# Patient Record
Sex: Male | Born: 1955 | Race: White | Hispanic: No | Marital: Married | Smoking: Never smoker
Health system: Southern US, Community
[De-identification: ages and names within clinical notes are randomized; demographics above are authoritative.]

## PROBLEM LIST (undated history)

## (undated) DIAGNOSIS — C719 Malignant neoplasm of brain, unspecified: Secondary | ICD-10-CM

## (undated) DIAGNOSIS — C801 Malignant (primary) neoplasm, unspecified: Secondary | ICD-10-CM

## (undated) DIAGNOSIS — C4490 Unspecified malignant neoplasm of skin, unspecified: Secondary | ICD-10-CM

## (undated) DIAGNOSIS — F419 Anxiety disorder, unspecified: Secondary | ICD-10-CM

## (undated) DIAGNOSIS — M1990 Unspecified osteoarthritis, unspecified site: Secondary | ICD-10-CM

## (undated) DIAGNOSIS — T4145XA Adverse effect of unspecified anesthetic, initial encounter: Secondary | ICD-10-CM

## (undated) DIAGNOSIS — K219 Gastro-esophageal reflux disease without esophagitis: Secondary | ICD-10-CM

## (undated) DIAGNOSIS — R0600 Dyspnea, unspecified: Secondary | ICD-10-CM

## (undated) DIAGNOSIS — T8859XA Other complications of anesthesia, initial encounter: Secondary | ICD-10-CM

## (undated) HISTORY — PX: KNEE ARTHROPLASTY: SHX992

## (undated) HISTORY — PX: JOINT REPLACEMENT: SHX530

## (undated) HISTORY — PX: KNEE ARTHROSCOPY: SUR90

## (undated) HISTORY — PX: SHOULDER ARTHROSCOPY: SHX128

## (undated) HISTORY — PX: OTHER SURGICAL HISTORY: SHX169

## (undated) HISTORY — PX: ANKLE SURGERY: SHX546

---

## 2005-12-17 ENCOUNTER — Inpatient Hospital Stay (HOSPITAL_COMMUNITY): Admission: RE | Admit: 2005-12-17 | Discharge: 2005-12-20 | Payer: Self-pay | Admitting: Specialist

## 2010-06-26 ENCOUNTER — Ambulatory Visit (HOSPITAL_COMMUNITY): Admission: RE | Admit: 2010-06-26 | Discharge: 2010-06-26 | Payer: Self-pay | Admitting: Specialist

## 2010-08-14 ENCOUNTER — Inpatient Hospital Stay (HOSPITAL_COMMUNITY): Admission: RE | Admit: 2010-08-14 | Discharge: 2010-08-16 | Payer: Self-pay | Admitting: Orthopedic Surgery

## 2010-10-14 ENCOUNTER — Encounter: Payer: Self-pay | Admitting: Specialist

## 2010-12-04 LAB — CBC
HCT: 27.7 % — ABNORMAL LOW (ref 39.0–52.0)
HCT: 32.1 % — ABNORMAL LOW (ref 39.0–52.0)
Hemoglobin: 11.2 g/dL — ABNORMAL LOW (ref 13.0–17.0)
Hemoglobin: 9.8 g/dL — ABNORMAL LOW (ref 13.0–17.0)
MCH: 32.1 pg (ref 26.0–34.0)
MCHC: 35.2 g/dL (ref 30.0–36.0)
MCV: 91.7 fL (ref 78.0–100.0)
Platelets: 318 10*3/uL (ref 150–400)
RBC: 3.54 MIL/uL — ABNORMAL LOW (ref 4.22–5.81)
RDW: 13.1 % (ref 11.5–15.5)
RDW: 13.3 % (ref 11.5–15.5)
WBC: 5.4 10*3/uL (ref 4.0–10.5)
WBC: 6.1 10*3/uL (ref 4.0–10.5)
WBC: 7.7 10*3/uL (ref 4.0–10.5)

## 2010-12-04 LAB — BASIC METABOLIC PANEL
BUN: 12 mg/dL (ref 6–23)
BUN: 17 mg/dL (ref 6–23)
CO2: 26 mEq/L (ref 19–32)
Calcium: 9.7 mg/dL (ref 8.4–10.5)
Chloride: 102 mEq/L (ref 96–112)
Chloride: 97 mEq/L (ref 96–112)
Creatinine, Ser: 1.03 mg/dL (ref 0.4–1.5)
GFR calc Af Amer: >60 mL/min (ref >60)
GFR calc Af Amer: >60 mL/min (ref >60)
GFR calc non Af Amer: >60 mL/min (ref >60)
GFR calc non Af Amer: >60 mL/min (ref >60)
Glucose, Bld: 113 mg/dL — ABNORMAL HIGH (ref 70–99)
Potassium: 3.7 mEq/L (ref 3.5–5.1)
Potassium: 4 mEq/L (ref 3.5–5.1)
Sodium: 137 mEq/L (ref 135–145)
Sodium: 140 mEq/L (ref 135–145)

## 2010-12-04 LAB — BODY FLUID CULTURE: Culture: NO GROWTH

## 2010-12-04 LAB — PROTIME-INR
INR: 0.96 (ref 0.00–1.49)
Prothrombin Time: 13 seconds (ref 11.6–15.2)

## 2010-12-04 LAB — ANAEROBIC CULTURE

## 2010-12-04 LAB — DIFFERENTIAL
Basophils Absolute: 0 10*3/uL (ref 0.0–0.1)
Eosinophils Absolute: 0.1 10*3/uL (ref 0.0–0.7)
Eosinophils Relative: 1 % (ref 0–5)
Neutrophils Relative %: 68 % (ref 43–77)

## 2010-12-04 LAB — URINALYSIS, ROUTINE W REFLEX MICROSCOPIC
Glucose, UA: NEGATIVE mg/dL
Ketones, ur: NEGATIVE mg/dL
Protein, ur: NEGATIVE mg/dL
Urobilinogen, UA: 0.2 mg/dL (ref 0.0–1.0)

## 2010-12-04 LAB — SURGICAL PCR SCREEN: Staphylococcus aureus: POSITIVE — AB

## 2010-12-04 LAB — GRAM STAIN

## 2011-02-08 NOTE — Discharge Summary (Signed)
Justin Hurst, DAUGHTRIDGE              ACCOUNT NO.:  [id number]   MEDICAL RECORD NO.:  [id number]          PATIENT TYPE:  INP   LOCATION:  1521                         FACILITY:  [facility]   PHYSICIAN:  Erasmo Leventhal, M.D.DATE OF BIRTH:  1956/01/28   DATE OF ADMISSION:  12/17/2005  DATE OF DISCHARGE:  12/20/2005                                 DISCHARGE SUMMARY   ADDITIONAL DIAGNOSES:  End-stage osteoarthritis of the left knee.   DISCHARGE DIAGNOSES:  End-stage osteoarthritis of the left knee.   OPERATION:  Total knee arthroplasty, left knee.   BRIEF HISTORY:  This is a 55 year old gentleman well known to [place] with  history of trauma at work who has developed end-stage osteoarthritis that is  unresponsive to conservative management.  After discussion of treatment  options and risks and benefits, the patient is now scheduled for total knee  arthroplasty.  Questions are invited and answered.   LABORATORY DATA:  Admission CBC within normal limits.  Hemoglobin and  hematocrit reached a low of 11.9 and 35.1.  Admission C-met within normal  limits with the exception of mildly elevated glucose of 125.  He ran  minimally elevated glucose's throughout admission.  Admission PT/PTT within  normal limits.  INR 1.9 at discharge.   HOSPITAL COURSE:  The patient tolerated the operative procedure extremely  well postoperatively.  He had less than expected amount of pain.  He ran a  mild temperature the first couple of nights that responded well to a set of  spirometry, coughing and deep breathing.  His hemoglobin and hematocrit  remained stable and acceptable throughout the hospital admission.  Lungs  remained clear.  Heart sounds normal.  Bowel sounds were sluggish the first  day and then returned to normal.  Neurovascular status remained intact and  in place throughout admission.  His calves were negative and his wound  remained benign.  He did exceptionally well in therapy, and on  postoperative  day #3, with vital signs stable and INR 1.9, hemoglobin 9, hematocrit 35.1,  with his would benign, calves negative, the patient was subsequently  discharged home for follow up in the office.  There was some initial  questions about his home health, home physical with another company that we  do not routinely use.  I called and discussed this with the case manager,  and told them for optimal patient results, therefore management of his  Coumadin, that [facility] has a __________on staff, and they are well  acquainted with our protocol, and this gives [place] the best chance for good  patient outcome.  They realized this, and subsequently [facility] will be  working with the patient postoperatively.   CONDITION ON DISCHARGE:  Improved.   DISCHARGE MEDICATIONS:  1.  Percocet 1-2 q.4-6 h p.r.n. pain.  2.  Robaxin 500 one p.o. q.8 h p.r.n. spasm.  3.  Trinsicon one p.o. daily for anemia.  4.  Coumadin per pharmacy protocol.   DISCHARGE INSTRUCTIONS:  He is to do his home __________, phone physical  therapy, and call if problems or questions arise.      Justin Senior  [name], P.A.    ______________________________  Erasmo Leventhal, M.D.    SJC/MEDQ  D:  12/20/2005  T:  12/21/2005  Job:  [id number]

## 2011-02-08 NOTE — Op Note (Signed)
NAMEJESTIN, BURBACH              ACCOUNT NO.:  [id number]   MEDICAL RECORD NO.:  [id number]          PATIENT TYPE:  INP   LOCATION:  1521                         FACILITY:  [facility]   PHYSICIAN:  Erasmo Leventhal, M.D.DATE OF BIRTH:  [birth year]   DATE OF PROCEDURE:  DATE OF DISCHARGE:                                 OPERATIVE REPORT   PREOPERATIVE DIAGNOSIS:  Left knee end-stage osteoarthritis.   POSTOPERATIVE DIAGNOSIS:  Left knee end-stage osteoarthritis.   PROCEDURE:  Left total knee arthroplasty.   SURGEON:  Erasmo Leventhal, M.D.   ASSISTANT:  Jaquelyn Bitter. Chabon, P.A.   ANESTHESIA:  Spinal with sedation, Jill Side, M.D.   ESTIMATED BLOOD LOSS:  Less than 50 mL.   DRAINS:  Two Hemovacs.   COMPLICATIONS:  None.   TOURNIQUET TIME:  One hour 5 minutes at 250 mmHg.   COMPLICATIONS:  None.   DISPOSITION:  PACU stable.   OPERATIVE IMPLANTS:  DePuy Sigma.  Size 4 femur, size 4 tibia, 10 mm  rotating platform tibial insertion, 38 mm patella, all cemented.   OPERATIVE DETAILS:  The patient was counseled in the holding area, correct  side was identified.  IV was started.  Antibiotics were given.  The patient  was taken to the operating room where spinal anesthetic was administered.  Foley catheter placed under sterile technique by the OR circulating nurse.  The patient was well-padded and bumped.  The left knee was examined, had a 5  degree flexion contracture, slight varus malalignment, flexed to 130.   Elevated, prepped with DuraPrep, draped in sterile fashion.  Exsanguinated  with an Esmarch.  Tourniquet was inflated to 250 mmHg.  Straight midline  incision made through skin and subcutaneous tissue.  Medial soft tissue  flaps were developed.  Proximally, a soft tissue release was done.  The  patella was retracted out of the way as gently as possibly and only when we  had to did we evert it.  Cruciate ligaments were resected.  End-stage  arthritis  changes, bone-against-bone contact.  Starter hole made in the  distal femur, canal was irrigated until effluent was clear.  The  intramedullary rod was gently placed.  I chose a 5 degree valgus cut and  took an 11 mm cut off the distal femur.  There was flexion contracture.  The  distal femur was found to be a size 4.  Rotation marks were made and cut to  fit a size 4.  The medial and lateral menisci were removed.  The  posteroinferior and posterolateral geniculate vessels were coagulated.  The  posterior neurovascular structures were thought of and protected throughout  the entire case.  The tibial meniscus was resected.  The proximal tibia was  found to be a size 4.  The central aspect was made, starter hole was used.  Step reamer was utilized.  Canal was irrigated and the effluent was clear.  The intramedullary rod was gently placed at this point in time.  I initially  chose a 2 mm cut based upon the defect that was on the medial side.  This  was done in 0 degree slope, posteromedial and posterior femoral chamfers  were made.  At this time, with the flexion and extension blocks at 10 mm for  a 10 mm insert with excellent flexion and extension gaps and well-balanced.  The tibial base plate was applied.  Rotation marks were set and a drill and  punch was then performed.  The femoral box cut was made in standard fashion.  At this time, the size 4 femur, size 4 tibia, 10 insert.  We had excellent  range of motion, soft tissue balance in varus and valgus stress.  The  patella was found to be a size 38.  Appropriate amount of bone was resected.  Locking holes were made with patellar button in place.  We had anatomic  patellofemoral tracking.   All trials were removed.  The knee was pulse irrigated with pulsatile lavage  while the cement was mixed.  Utilizing modern cement technique, all  components were cemented into place, size 4 tibia, size 4 femur, 38 patella.  After the cement had cured,  excess cement was removed being very careful of  the implant and put in place a final 10 mm posterior stabilized rotating  platform tibial insert.  We now had stable varus and valgus stressing.  We  had well-balanced flexion and extension gaps.  The patellofemoral track was  anatomic.  Again, the knee was irrigated with pulsatile lavage.  We made  sure we removed all excess cement.  Two Hemovac drains were placed.  A  sequential closure in layers was done.  The arthrotomy was closed with 9  degrees of flexion, the subcutaneous with Vicryl, the skin closed with  subcuticular Monocryl suture.  Steri-Strips were applied.  Drains hooked to  suction.  Sterile dressing applied.  Tourniquet was deflated.  Normal  circulation of the foot and ankle at the end of the case.  The patient  tolerated the procedure well.  There were no complications.  Sponge and  needle count correct.  He was taken from the operating room table in stable  condition.   To decrease surgical team, the assistance of Jaquelyn Bitter. Chabon, P.A. was  needed.           ______________________________  Erasmo Leventhal, M.D.     RAC/MEDQ  D:  12/17/2005  T:  12/19/2005  Job:  [id number]

## 2011-02-22 ENCOUNTER — Emergency Department (HOSPITAL_COMMUNITY): Payer: Worker's Compensation

## 2011-02-22 ENCOUNTER — Emergency Department (HOSPITAL_COMMUNITY)
Admission: EM | Admit: 2011-02-22 | Discharge: 2011-02-23 | Disposition: A | Discharge status: Home / Self Care | Payer: Worker's Compensation | Attending: Emergency Medicine | Admitting: Emergency Medicine

## 2011-02-22 DIAGNOSIS — W208XXA Other cause of strike by thrown, projected or falling object, initial encounter: Secondary | ICD-10-CM | POA: Insufficient documentation

## 2011-02-22 DIAGNOSIS — M7989 Other specified soft tissue disorders: Secondary | ICD-10-CM | POA: Insufficient documentation

## 2011-02-22 DIAGNOSIS — M79609 Pain in unspecified limb: Secondary | ICD-10-CM | POA: Insufficient documentation

## 2011-02-22 DIAGNOSIS — S92919A Unspecified fracture of unspecified toe(s), initial encounter for closed fracture: Secondary | ICD-10-CM | POA: Insufficient documentation

## 2012-04-26 ENCOUNTER — Emergency Department (HOSPITAL_COMMUNITY): Payer: PRIVATE HEALTH INSURANCE

## 2012-04-26 ENCOUNTER — Emergency Department (HOSPITAL_COMMUNITY)
Admission: EM | Admit: 2012-04-26 | Discharge: 2012-04-26 | Disposition: A | Discharge status: Home / Self Care | Payer: PRIVATE HEALTH INSURANCE | Attending: Emergency Medicine | Admitting: Emergency Medicine

## 2012-04-26 ENCOUNTER — Encounter (HOSPITAL_COMMUNITY): Payer: Self-pay | Admitting: *Deleted

## 2012-04-26 DIAGNOSIS — S43016A Anterior dislocation of unspecified humerus, initial encounter: Secondary | ICD-10-CM | POA: Insufficient documentation

## 2012-04-26 DIAGNOSIS — W1789XA Other fall from one level to another, initial encounter: Secondary | ICD-10-CM | POA: Insufficient documentation

## 2012-04-26 DIAGNOSIS — S43006A Unspecified dislocation of unspecified shoulder joint, initial encounter: Secondary | ICD-10-CM

## 2012-04-26 MED ORDER — OXYCODONE-ACETAMINOPHEN 5-325 MG PO TABS
2.0000 | ORAL_TABLET | Freq: Once | ORAL | Status: AC
  Administered 2012-04-26: 2 via ORAL
  Filled 2012-04-26: qty 2

## 2012-04-26 NOTE — ED Provider Notes (Signed)
History     CSN: [id number]  Arrival date & time 04/26/12  1120   None     Chief Complaint  Patient presents with  . Shoulder Pain    (Consider location/radiation/quality/duration/timing/severity/associated sxs/prior treatment) HPI Comments: The patient presents today after falling while doing yard work about an hour ago. He reports falling backwards into a hole, which was close to a 5 foot drop, and he landed on his right hand. After the fall he gradually developed right shoulder pain that he describes as dull and constant and rated 8/10. He has no previous injury to this shoulder. He reports numbness and tingling of his forearm and his 3rd and 4th phalanges. He denies head trauma or LOC. He denies any other injury or pain. His wife is at the bedside and did not see him fall.   Patient is a 56 y.o. male presenting with shoulder pain.  Shoulder Pain Associated symptoms include arthralgias. Pertinent negatives include no abdominal pain, chest pain, fatigue, headaches, nausea, neck pain, rash or vomiting.    No past medical history on file.  Past Surgical History  Procedure Date  . Ankle surgery   . Knee arthroscopy   . Shoulder arthroscopy     No family history on file.  History  Substance Use Topics  . Smoking status: Not on file  . Smokeless tobacco: Not on file  . Alcohol Use: Yes      Review of Systems  Constitutional: Negative for activity change and fatigue.  HENT: Negative for neck pain and neck stiffness.   Eyes: Negative for visual disturbance.  Respiratory: Negative for shortness of breath.   Cardiovascular: Negative for chest pain.  Gastrointestinal: Negative for nausea, vomiting, abdominal pain and diarrhea.  Musculoskeletal: Positive for arthralgias. Negative for back pain.  Skin: Negative for rash.  Neurological: Negative for dizziness, syncope, light-headedness and headaches.    Allergies  Review of patient's allergies indicates no known  allergies.  Home Medications   Current Outpatient Rx  Name Route Sig Dispense Refill  . ALPRAZOLAM 1 MG PO TABS Oral Take 1 mg by mouth 3 (three) times daily as needed. For anxiety.    Marland Kitchen HYDROCODONE-ACETAMINOPHEN 10-325 MG PO TABS Oral Take 1 tablet by mouth every 6 (six) hours as needed. For pain.    Marland Kitchen NAPROXEN SODIUM 220 MG PO TABS Oral Take 440 mg by mouth 2 (two) times daily as needed. For pain.    Marland Kitchen SERTRALINE HCL 100 MG PO TABS Oral Take 100 mg by mouth daily.      BP 140/96  Pulse 87  Temp 98.9 F (37.2 C) (Oral)  Resp 18  Wt 185 lb (83.915 kg)  SpO2 96%  Physical Exam  Nursing note and vitals reviewed. Constitutional: He appears well-developed and well-nourished. No distress.  HENT:  Head: Normocephalic and atraumatic.       No evidence of head trauma noted.  Eyes: Conjunctivae are normal. No scleral icterus.  Neck: Normal range of motion. Neck supple.       Full ROM of neck without pain.  Cardiovascular: Normal rate, regular rhythm and intact distal pulses.  Exam reveals no gallop and no friction rub.   No murmur heard. Pulmonary/Chest: Effort normal and breath sounds normal. He has no wheezes. He has no rales. He exhibits no tenderness.  Abdominal: Soft. There is no tenderness.  Musculoskeletal:       Patient's R shoulder joint appearance varies from the appearance of the L shoulder. Limited ROM  in all fields of R shoulder due to pain. Patient's vertebrae are nontender to palpation. Sufficient capillary refill of phalanges bilaterally.   Neurological: He is alert. No cranial nerve deficit. Coordination normal.       Patient expresses numbness to light touch of volar surface of R forearm and 3rd and 4th phalanges.   Skin: Skin is warm and dry. He is not diaphoretic.  Psychiatric: He has a normal mood and affect. His behavior is normal.    ED Course  Procedures (including critical care time)  Labs Reviewed - No data to display Dg Shoulder Right  04/26/2012   *RADIOLOGY REPORT*  Clinical Data: Dislocation, reduced  RIGHT SHOULDER - 2+ VIEW  Comparison: Earlier films of the same day  Findings: Previously noted dislocation has been reduced.  Negative for fracture or other acute bony abnormality.  Old right fifth, sixth, and seventh rib fractures incidentally noted. Normal mineralization and alignment.  IMPRESSION:  1.  Reduction of the dislocation.  Original Report Authenticated By: Osa Craver, M.D.   Dg Shoulder Right  04/26/2012  *RADIOLOGY REPORT*  Clinical Data: Traumatic injury and pain  RIGHT SHOULDER - 2+ VIEW  Comparison: None.  Findings: There is an anterior inferior dislocation of the humeral head with respect to the glenoid .  No definitive fracture is seen. There are underlying chronic fractures of the right rib cage.  IMPRESSION: Anterior inferior dislocation of the right humeral head.  Original Report Authenticated By: Phillips Odor, M.D.     No diagnosis found.    MDM  12:14 PM Patient is in pain after falling on right shoulder. I have ordered Percocet for him and shoulder xrays are pending. I will check back with him when the xrays are back.   12:39 PM Patient's xrays reveal an anterior inferior right shoulder dislocation. Dr. Silverio Lay saw the patient and reduced the dislocation. Repeat xrays ordered. If xrays confirm reduction, patient can be discharged home.   1:16 PM R shoulder xray shows reduction of dislocation. Patients R shoulder ROM rechecked and shows significant improved with minor limitation due to "soreness," per patient. He reports continued resolution of numbness and tingling of distal R arm. No signs of vascular compromise. Patient has denied pain medication because he already has a prescription for hydrocodone that he is currently taking. No further evaluation is needed at this time. Patient advised to return to the ED if symptoms worsen also to follow up with Ortho if he is having continued shoulder pain following  this dislocation.     Emilia Beck, PA-C 04/26/12 1607

## 2012-04-26 NOTE — ED Notes (Signed)
pa at bedside. 

## 2012-04-26 NOTE — ED Notes (Signed)
Ortho called for Sling Immobilizer.

## 2012-04-26 NOTE — ED Notes (Signed)
Pt returned from xray for the second time

## 2012-04-26 NOTE — ED Notes (Signed)
MD at bedside. 

## 2012-04-29 NOTE — ED Provider Notes (Signed)
Medical screening examination/treatment/procedure(s) were conducted as a shared visit with non-physician practitioner(s) and myself.  I personally evaluated the patient during the encounter   Justin Hurst is a 56 y.o. male who fell backwards into a hole. He has anterior shoulder dislocation on xray. On exam, shoulder is displaced anteriorly with preserved neurovascular exam.   I relocated the shoulder with the PA. We injected 10 cc 1% lido into the shoulder joint after cleaning with alcohol. Traction applied to the elbow and the shoulder was reduced easily. Repeat xray showed no fracture. Patient given hydrocodone for pain on discharge.    Richardean Canal, MD 04/29/12 [phone]

## 2014-11-30 ENCOUNTER — Other Ambulatory Visit (HOSPITAL_COMMUNITY): Payer: Self-pay | Admitting: Physician Assistant

## 2014-11-30 ENCOUNTER — Ambulatory Visit (HOSPITAL_COMMUNITY)
Admission: RE | Admit: 2014-11-30 | Discharge: 2014-11-30 | Disposition: A | Discharge status: Home / Self Care | Payer: PRIVATE HEALTH INSURANCE | Source: Ambulatory Visit | Attending: Internal Medicine | Admitting: Internal Medicine

## 2014-11-30 DIAGNOSIS — M7989 Other specified soft tissue disorders: Secondary | ICD-10-CM

## 2014-11-30 DIAGNOSIS — M79672 Pain in left foot: Secondary | ICD-10-CM | POA: Insufficient documentation

## 2014-11-30 DIAGNOSIS — M79662 Pain in left lower leg: Secondary | ICD-10-CM

## 2014-11-30 NOTE — Progress Notes (Signed)
Left Lower Extremity Venous Duplex Completed. Negative for DVT or SVT. Oda Cogan, BS, RDMS, RVT

## 2014-12-06 ENCOUNTER — Telehealth (HOSPITAL_COMMUNITY): Payer: Self-pay | Admitting: *Deleted

## 2014-12-29 ENCOUNTER — Other Ambulatory Visit (HOSPITAL_COMMUNITY): Payer: Self-pay | Admitting: Orthopedic Surgery

## 2014-12-29 DIAGNOSIS — T84033D Mechanical loosening of internal left knee prosthetic joint, subsequent encounter: Secondary | ICD-10-CM

## 2015-01-18 ENCOUNTER — Encounter (HOSPITAL_COMMUNITY)
Admission: RE | Admit: 2015-01-18 | Discharge: 2015-01-18 | Disposition: A | Discharge status: Home / Self Care | Payer: PRIVATE HEALTH INSURANCE | Source: Ambulatory Visit | Attending: Orthopedic Surgery | Admitting: Orthopedic Surgery

## 2015-01-18 DIAGNOSIS — T84033D Mechanical loosening of internal left knee prosthetic joint, subsequent encounter: Secondary | ICD-10-CM | POA: Diagnosis present

## 2015-01-18 MED ORDER — TECHNETIUM TC 99M MEDRONATE IV KIT
25.0000 | PACK | Freq: Once | INTRAVENOUS | Status: AC | PRN
  Administered 2015-01-18: 25 via INTRAVENOUS

## 2016-11-11 DIAGNOSIS — M19072 Primary osteoarthritis, left ankle and foot: Secondary | ICD-10-CM | POA: Insufficient documentation

## 2016-11-11 DIAGNOSIS — M25872 Other specified joint disorders, left ankle and foot: Secondary | ICD-10-CM | POA: Insufficient documentation

## 2017-05-23 DIAGNOSIS — M19172 Post-traumatic osteoarthritis, left ankle and foot: Secondary | ICD-10-CM | POA: Insufficient documentation

## 2018-02-13 DIAGNOSIS — M25562 Pain in left knee: Secondary | ICD-10-CM | POA: Insufficient documentation

## 2018-03-02 NOTE — Patient Instructions (Addendum)
Justin Hurst  03/02/2018   Your procedure is scheduled on: 03-09-18  Report to [facility] Main  Entrance               Report to admitting at      1245 PM    Call this number if you have problems the morning of surgery [phone]    Remember: Do not eat food :After Midnight.  You may have clear liquids until 0900 am then nothing by mouth     CLEAR LIQUID DIET   Foods Allowed                                                                     Foods Excluded  Coffee and tea, regular and decaf                             liquids that you cannot  Plain Jell-O in any flavor                                             see through such as: Fruit ices (not with fruit pulp)                                     milk, soups, orange juice  Iced Popsicles                                    All solid food Carbonated beverages, regular and diet                                    Cranberry, grape and apple juices Sports drinks like Gatorade Lightly seasoned clear broth or consume(fat free) Sugar, honey syrup  _______   Take these medicines the morning of surgery with A SIP OF WATER: xanax if needed and tylenol if needed                                You may not have any metal on your body including hair pins and              piercings  Do not wear jewelry,  lotions, powders or perfumes, deodorant                    Men may shave face and neck.   Do not bring valuables to the hospital. Justin Hurst.  Contacts, dentures or bridgework may not be worn into surgery.  Leave suitcase in the car. After surgery it may be brought to your room.  Please read over the following fact sheets you were given: _____________________________________________________________________          [facility] - Preparing for Surgery Before surgery, you can play an important role.  Because skin is not sterile, your skin  needs to be as free of germs as possible.  You can reduce the number of germs on your skin by washing with CHG (chlorahexidine gluconate) soap before surgery.  CHG is an antiseptic cleaner which kills germs and bonds with the skin to continue killing germs even after washing. Please DO NOT use if you have an allergy to CHG or antibacterial soaps.  If your skin becomes reddened/irritated stop using the CHG and inform your nurse when you arrive at Short Stay. Do not shave (including legs and underarms) for at least 48 hours prior to the first CHG shower.  You may shave your face/neck. Please follow these instructions carefully:  1.  Shower with CHG Soap the night before surgery and the  morning of Surgery.  2.  If you choose to wash your hair, wash your hair first as usual with your  normal  shampoo.  3.  After you shampoo, rinse your hair and body thoroughly to remove the  shampoo.                           4.  Use CHG as you would any other liquid soap.  You can apply chg directly  to the skin and wash                       Gently with a scrungie or clean washcloth.  5.  Apply the CHG Soap to your body ONLY FROM THE NECK DOWN.   Do not use on face/ open                           Wound or open sores. Avoid contact with eyes, ears mouth and genitals (private parts).                       Wash face,  Genitals (private parts) with your normal soap.             6.  Wash thoroughly, paying special attention to the area where your surgery  will be performed.  7.  Thoroughly rinse your body with warm water from the neck down.  8.  DO NOT shower/wash with your normal soap after using and rinsing off  the CHG Soap.                9.  Pat yourself dry with a clean towel.            10.  Wear clean pajamas.            11.  Place clean sheets on your bed the night of your first shower and do not  sleep with pets. Day of Surgery : Do not apply any lotions/deodorants the morning of surgery.  Please wear clean  clothes to the hospital/surgery center.  FAILURE TO FOLLOW THESE INSTRUCTIONS MAY RESULT IN THE CANCELLATION OF YOUR SURGERY PATIENT SIGNATURE_________________________________  NURSE SIGNATURE__________________________________  ________________________________________________________________________  WHAT IS A BLOOD TRANSFUSION? Blood Transfusion Information  A transfusion is the replacement of blood or some of its parts. Blood is made up of multiple cells which provide different functions.  Red blood cells  carry oxygen and are used for blood loss replacement.  White blood cells fight against infection.  Platelets control bleeding.  Plasma helps clot blood.  Other blood products are available for specialized needs, such as hemophilia or other clotting disorders. BEFORE THE TRANSFUSION  Who gives blood for transfusions?   Healthy volunteers who are fully evaluated to make sure their blood is safe. This is blood bank blood. Transfusion therapy is the safest it has ever been in the practice of medicine. Before blood is taken from a donor, a complete history is taken to make sure that person has no history of diseases nor engages in risky social behavior (examples are intravenous drug use or sexual activity with multiple partners). The donor's travel history is screened to minimize risk of transmitting infections, such as malaria. The donated blood is tested for signs of infectious diseases, such as HIV and hepatitis. The blood is then tested to be sure it is compatible with you in order to minimize the chance of a transfusion reaction. If you or a relative donates blood, this is often done in anticipation of surgery and is not appropriate for emergency situations. It takes many days to process the donated blood. RISKS AND COMPLICATIONS Although transfusion therapy is very safe and saves many lives, the main dangers of transfusion include:   Getting an infectious disease.  Developing a  transfusion reaction. This is an allergic reaction to something in the blood you were given. Every precaution is taken to prevent this. The decision to have a blood transfusion has been considered carefully by your caregiver before blood is given. Blood is not given unless the benefits outweigh the risks. AFTER THE TRANSFUSION  Right after receiving a blood transfusion, you will usually feel much better and more energetic. This is especially true if your red blood cells have gotten low (anemic). The transfusion raises the level of the red blood cells which carry oxygen, and this usually causes an energy increase.  The nurse administering the transfusion will monitor you carefully for complications. HOME CARE INSTRUCTIONS  No special instructions are needed after a transfusion. You may find your energy is better. Speak with your caregiver about any limitations on activity for underlying diseases you may have. SEEK MEDICAL CARE IF:   Your condition is not improving after your transfusion.  You develop redness or irritation at the intravenous (IV) site. SEEK IMMEDIATE MEDICAL CARE IF:  Any of the following symptoms occur over the next 12 hours:  Shaking chills.  You have a temperature by mouth above 102 F (38.9 C), not controlled by medicine.  Chest, back, or muscle pain.  People around you feel you are not acting correctly or are confused.  Shortness of breath or difficulty breathing.  Dizziness and fainting.  You get a rash or develop hives.  You have a decrease in urine output.  Your urine turns a dark color or changes to pink, red, or brown. Any of the following symptoms occur over the next 10 days:  You have a temperature by mouth above 102 F (38.9 C), not controlled by medicine.  Shortness of breath.  Weakness after normal activity.  The white part of the eye turns yellow (jaundice).  You have a decrease in the amount of urine or are urinating less often.  Your  urine turns a dark color or changes to pink, red, or brown. Document Released: 09/06/2000 Document Revised: 12/02/2011 Document Reviewed: 04/25/2008 [facility] Patient Information 2014 [place], [place].  _______________________________________________________________________  Incentive  Spirometer  An incentive spirometer is a tool that can help keep your lungs clear and active. This tool measures how well you are filling your lungs with each breath. Taking long deep breaths may help reverse or decrease the chance of developing breathing (pulmonary) problems (especially infection) following:  A long period of time when you are unable to move or be active. BEFORE THE PROCEDURE   If the spirometer includes an indicator to show your best effort, your nurse or respiratory therapist will set it to a desired goal.  If possible, sit up straight or lean slightly forward. Try not to slouch.  Hold the incentive spirometer in an upright position. INSTRUCTIONS FOR USE  1. Sit on the edge of your bed if possible, or sit up as far as you can in bed or on a chair. 2. Hold the incentive spirometer in an upright position. 3. Breathe out normally. 4. Place the mouthpiece in your mouth and seal your lips tightly around it. 5. Breathe in slowly and as deeply as possible, raising the piston or the ball toward the top of the column. 6. Hold your breath for 3-5 seconds or for as long as possible. Allow the piston or ball to fall to the bottom of the column. 7. Remove the mouthpiece from your mouth and breathe out normally. 8. Rest for a few seconds and repeat Steps 1 through 7 at least 10 times every 1-2 hours when you are awake. Take your time and take a few normal breaths between deep breaths. 9. The spirometer may include an indicator to show your best effort. Use the indicator as a goal to work toward during each repetition. 10. After each set of 10 deep breaths, practice coughing to be sure your lungs are  clear. If you have an incision (the cut made at the time of surgery), support your incision when coughing by placing a pillow or rolled up towels firmly against it. Once you are able to get out of bed, walk around indoors and cough well. You may stop using the incentive spirometer when instructed by your caregiver.  RISKS AND COMPLICATIONS  Take your time so you do not get dizzy or light-headed.  If you are in pain, you may need to take or ask for pain medication before doing incentive spirometry. It is harder to take a deep breath if you are having pain. AFTER USE  Rest and breathe slowly and easily.  It can be helpful to keep track of a log of your progress. Your caregiver can provide you with a simple table to help with this. If you are using the spirometer at home, follow these instructions: [place] IF:   You are having difficultly using the spirometer.  You have trouble using the spirometer as often as instructed.  Your pain medication is not giving enough relief while using the spirometer.  You develop fever of 100.5 F (38.1 C) or higher. SEEK IMMEDIATE MEDICAL CARE IF:   You cough up bloody sputum that had not been present before.  You develop fever of 102 F (38.9 C) or greater.  You develop worsening pain at or near the incision site. MAKE SURE YOU:   Understand these instructions.  Will watch your condition.  Will get help right away if you are not doing well or get worse. Document Released: 01/20/2007 Document Revised: 12/02/2011 Document Reviewed: 03/23/2007 [facility] Patient Information 2014 [place], [place].   ________________________________________________________________________

## 2018-03-03 ENCOUNTER — Encounter (HOSPITAL_COMMUNITY): Payer: Self-pay

## 2018-03-03 ENCOUNTER — Other Ambulatory Visit: Payer: Self-pay

## 2018-03-03 ENCOUNTER — Encounter (HOSPITAL_COMMUNITY)
Admission: RE | Admit: 2018-03-03 | Discharge: 2018-03-03 | Disposition: A | Discharge status: Home / Self Care | Payer: PRIVATE HEALTH INSURANCE | Source: Ambulatory Visit | Attending: Orthopedic Surgery | Admitting: Orthopedic Surgery

## 2018-03-03 DIAGNOSIS — T84115A Breakdown (mechanical) of internal fixation device of left femur, initial encounter: Secondary | ICD-10-CM | POA: Insufficient documentation

## 2018-03-03 DIAGNOSIS — Z01812 Encounter for preprocedural laboratory examination: Secondary | ICD-10-CM | POA: Insufficient documentation

## 2018-03-03 HISTORY — DX: Unspecified osteoarthritis, unspecified site: M19.90

## 2018-03-03 HISTORY — DX: Other complications of anesthesia, initial encounter: T88.59XA

## 2018-03-03 HISTORY — DX: Adverse effect of unspecified anesthetic, initial encounter: T41.45XA

## 2018-03-03 HISTORY — DX: Anxiety disorder, unspecified: F41.9

## 2018-03-03 LAB — SURGICAL PCR SCREEN
MRSA, PCR: NEGATIVE
Staphylococcus aureus: POSITIVE — AB

## 2018-03-03 LAB — CBC
HCT: 45.2 % (ref 39.0–52.0)
HEMOGLOBIN: 15.4 g/dL (ref 13.0–17.0)
MCH: 30.8 pg (ref 26.0–34.0)
MCHC: 34.1 g/dL (ref 30.0–36.0)
MCV: 90.4 fL (ref 78.0–100.0)
Platelets: 284 10*3/uL (ref 150–400)
RBC: 5 MIL/uL (ref 4.22–5.81)
RDW: 13.1 % (ref 11.5–15.5)
WBC: 5.9 10*3/uL (ref 4.0–10.5)

## 2018-03-08 MED ORDER — TRANEXAMIC ACID 1000 MG/10ML IV SOLN
1000.0000 mg | INTRAVENOUS | Status: AC
  Administered 2018-03-09: 1000 mg via INTRAVENOUS
  Filled 2018-03-08: qty 1100

## 2018-03-08 NOTE — H&P (Signed)
TOTAL KNEE REVISION ADMISSION H&P  Patient is being admitted for left revision total knee arthroplasty.  Subjective:  Chief Complaint:  Left knee pain s/p TKA  HPI: HAZEN BRUMETT, 62 y.o. male, has a history of pain and functional disability in the left knee(s) due to trauma and patient has failed non-surgical conservative treatments for greater than 12 weeks to include NSAID's and/or analgesics, use of assistive devices, weight reduction as appropriate and activity modification. The indications for the revision of the total knee arthroplasty are loosening of one or more components, fracture or mechanical failure of one or components and treatment of periprosthectic fracture of distal femur, proximal tibia or patella. Onset of symptoms was abrupt starting 2 months ago with rapidlly worsening course since that time.  Prior procedures on the left knee(s) include arthroplasty and and revision.  Patient currently rates pain in the left knee(s) at 9 out of 10 with activity. There is night pain, worsening of pain with activity and weight bearing, pain that interferes with activities of daily living and pain with passive range of motion.  Patient has evidence of prosthetic loosening and periprosthetic distal femur fx by imaging studies. This condition presents safety issues increasing the risk of falls.  There is no current active infection.   Past Medical History:  Diagnosis Date  . Anxiety   . Arthritis   . Complication of anesthesia    caused gas    Past Surgical History:  Procedure Laterality Date  . ankle fusions     bil at [facility]  . ANKLE SURGERY    . JOINT REPLACEMENT     revision Dr. Alvan Dame Left Knee 03-09-18   . KNEE ARTHROPLASTY     x2   . KNEE ARTHROSCOPY     Left x3  . SHOULDER ARTHROSCOPY     Left    Current Facility-Administered Medications  Medication Dose Route Frequency Provider Last Rate Last Dose  . [START ON 03/09/2018] tranexamic acid (CYKLOKAPRON) 1,000 mg in sodium  chloride 0.9 % 100 mL IVPB  1,000 mg Intravenous To OR Paralee Cancel, MD       Current Outpatient Medications  Medication Sig Dispense Refill Last Dose  . acetaminophen (TYLENOL) 500 MG tablet Take 1,000 mg by mouth 3 (three) times daily as needed for moderate pain or headache.     . ALPRAZolam (XANAX) 1 MG tablet Take 1 mg by mouth 2 (two) times daily.    04/25/2012 at Unknown  . sertraline (ZOLOFT) 100 MG tablet Take 100 mg by mouth every evening.    04/25/2012 at Unknown   No Known Allergies   Social History   Tobacco Use  . Smoking status: Never Smoker  . Smokeless tobacco: Never Used  Substance Use Topics  . Alcohol use: Yes    Comment: 1-2 day       Review of Systems  Constitutional: Negative.   HENT: Negative.   Eyes: Negative.   Respiratory: Negative.   Cardiovascular: Negative.   Gastrointestinal: Negative.   Genitourinary: Negative.   Musculoskeletal: Positive for joint pain.  Skin: Negative.   Neurological: Negative.   Endo/Heme/Allergies: Negative.   Psychiatric/Behavioral: The patient is nervous/anxious.      Objective:  Physical Exam  Constitutional: He is oriented to person, place, and time. He appears well-developed.  HENT:  Head: Normocephalic.  Eyes: Pupils are equal, round, and reactive to light.  Neck: Neck supple. No JVD present. No tracheal deviation present. No thyromegaly present.  Cardiovascular: Normal rate, regular  rhythm and intact distal pulses.  Respiratory: Effort normal and breath sounds normal. No respiratory distress. He has no wheezes.  GI: Soft. There is no tenderness. There is no guarding.  Musculoskeletal:       Left knee: He exhibits decreased range of motion, swelling and bony tenderness. He exhibits no ecchymosis, no deformity and no erythema. Tenderness found.  Lymphadenopathy:    He has no cervical adenopathy.  Neurological: He is alert and oriented to person, place, and time.  Skin: Skin is warm and dry.  Psychiatric: He has  a normal mood and affect.       Labs:  Estimated body mass index is 26.61 kg/m as calculated from the following:   Height as of 03/03/18: 5\' 8"  (1.727 m).   Weight as of 03/03/18: 79.4 kg (175 lb).  Imaging Review Plain radiographs demonstrate periprosthetic fx of the left knee(s). There is evidence of loosening of the femoral components. The bone quality appears to be fair for age and reported activity level.    Preoperative templating of the joint replacement has been completed, documented, and submitted to the Operating Room personnel in order to optimize intra-operative equipment management.   Assessment/Plan:  Left periprosthetic distal femur fx   The patient history, physical examination, clinical judgment of the provider and imaging studies are consistent with periprosthetic distal femur fx  the left knee(s), previous total knee arthroplasty. Revision total knee arthroplasty is deemed medically necessary. The treatment options including medical management, injection therapy, arthroscopy and revision arthroplasty were discussed at length. The risks and benefits of revision total knee arthroplasty were presented and reviewed. The risks due to aseptic loosening, infection, stiffness, patella tracking problems, thromboembolic complications and other imponderables were discussed. The patient acknowledged the explanation, agreed to proceed with the plan and consent was signed. Patient is being admitted for inpatient treatment for surgery, pain control, PT, OT, prophylactic antibiotics, VTE prophylaxis, progressive ambulation and ADL's and discharge planning.The patient is planning to be discharged home.    [place] Sueann Brownley   PA-C  03/08/2018, 10:12 PM

## 2018-03-09 ENCOUNTER — Inpatient Hospital Stay (HOSPITAL_COMMUNITY): Payer: PRIVATE HEALTH INSURANCE

## 2018-03-09 ENCOUNTER — Inpatient Hospital Stay (HOSPITAL_COMMUNITY): Payer: PRIVATE HEALTH INSURANCE | Admitting: Anesthesiology

## 2018-03-09 ENCOUNTER — Encounter (HOSPITAL_COMMUNITY): Payer: Self-pay | Admitting: Emergency Medicine

## 2018-03-09 ENCOUNTER — Other Ambulatory Visit: Payer: Self-pay

## 2018-03-09 ENCOUNTER — Encounter (HOSPITAL_COMMUNITY)
Admission: RE | Disposition: A | Discharge status: Home / Self Care | Payer: Self-pay | Source: Home / Self Care | Attending: Orthopedic Surgery

## 2018-03-09 ENCOUNTER — Inpatient Hospital Stay (HOSPITAL_COMMUNITY)
Admission: RE | Admit: 2018-03-09 | Discharge: 2018-03-10 | DRG: 467 | Disposition: A | Discharge status: Home / Self Care | Payer: PRIVATE HEALTH INSURANCE | Attending: Orthopedic Surgery | Admitting: Orthopedic Surgery

## 2018-03-09 DIAGNOSIS — T84033A Mechanical loosening of internal left knee prosthetic joint, initial encounter: Secondary | ICD-10-CM | POA: Diagnosis present

## 2018-03-09 DIAGNOSIS — M9712XA 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, initial encounter: Secondary | ICD-10-CM | POA: Diagnosis present

## 2018-03-09 DIAGNOSIS — S72432A Displaced fracture of medial condyle of left femur, initial encounter for closed fracture: Secondary | ICD-10-CM | POA: Diagnosis present

## 2018-03-09 DIAGNOSIS — T84093A Other mechanical complication of internal left knee prosthesis, initial encounter: Principal | ICD-10-CM | POA: Diagnosis present

## 2018-03-09 DIAGNOSIS — M25562 Pain in left knee: Secondary | ICD-10-CM | POA: Diagnosis present

## 2018-03-09 DIAGNOSIS — Y792 Prosthetic and other implants, materials and accessory orthopedic devices associated with adverse incidents: Secondary | ICD-10-CM | POA: Diagnosis present

## 2018-03-09 DIAGNOSIS — Z419 Encounter for procedure for purposes other than remedying health state, unspecified: Secondary | ICD-10-CM

## 2018-03-09 HISTORY — PX: ORIF FEMUR FRACTURE: SHX2119

## 2018-03-09 LAB — TYPE AND SCREEN
ABO/RH(D): A POS
ANTIBODY SCREEN: NEGATIVE

## 2018-03-09 SURGERY — OPEN REDUCTION INTERNAL FIXATION (ORIF) DISTAL FEMUR FRACTURE
Anesthesia: Spinal | Site: Knee | Laterality: Left

## 2018-03-09 MED ORDER — PROPOFOL 10 MG/ML IV BOLUS
INTRAVENOUS | Status: AC
  Filled 2018-03-09: qty 40

## 2018-03-09 MED ORDER — METOCLOPRAMIDE HCL 5 MG PO TABS: 5.0000 mg | ORAL_TABLET | Freq: Three times a day (TID) | ORAL | Status: DC | PRN

## 2018-03-09 MED ORDER — SODIUM CHLORIDE 0.9 % IJ SOLN
INTRAMUSCULAR | Status: AC
  Filled 2018-03-09: qty 50

## 2018-03-09 MED ORDER — OXYCODONE HCL 5 MG PO TABS: 5.0000 mg | ORAL_TABLET | Freq: Once | ORAL | Status: DC | PRN

## 2018-03-09 MED ORDER — FERROUS SULFATE 325 (65 FE) MG PO TABS
325.0000 mg | ORAL_TABLET | Freq: Three times a day (TID) | ORAL | Status: DC
  Administered 2018-03-10: 325 mg via ORAL
  Filled 2018-03-09: qty 1

## 2018-03-09 MED ORDER — SODIUM CHLORIDE 0.9 % IV SOLN
INTRAVENOUS | Status: DC
  Administered 2018-03-09 – 2018-03-10 (×2): via INTRAVENOUS

## 2018-03-09 MED ORDER — KETOROLAC TROMETHAMINE 30 MG/ML IJ SOLN
INTRAMUSCULAR | Status: AC
  Filled 2018-03-09: qty 1

## 2018-03-09 MED ORDER — SODIUM CHLORIDE 0.9 % IJ SOLN
INTRAMUSCULAR | Status: DC | PRN
  Administered 2018-03-09: 29 mL

## 2018-03-09 MED ORDER — ONDANSETRON HCL 4 MG/2ML IJ SOLN: 4.0000 mg | Freq: Four times a day (QID) | INTRAMUSCULAR | Status: DC | PRN

## 2018-03-09 MED ORDER — DEXTROSE 5 % IV SOLN
500.0000 mg | Freq: Four times a day (QID) | INTRAVENOUS | Status: DC | PRN
  Administered 2018-03-09: 500 mg via INTRAVENOUS
  Filled 2018-03-09: qty 550

## 2018-03-09 MED ORDER — FERROUS SULFATE 325 (65 FE) MG PO TABS: 325.0000 mg | ORAL_TABLET | Freq: Three times a day (TID) | ORAL | 3 refills | Status: DC

## 2018-03-09 MED ORDER — PHENOL 1.4 % MT LIQD
1.0000 | OROMUCOSAL | Status: DC | PRN
  Filled 2018-03-09: qty 177

## 2018-03-09 MED ORDER — ONDANSETRON HCL 4 MG PO TABS: 4.0000 mg | ORAL_TABLET | Freq: Four times a day (QID) | ORAL | Status: DC | PRN

## 2018-03-09 MED ORDER — HYDROCODONE-ACETAMINOPHEN 5-325 MG PO TABS
1.0000 | ORAL_TABLET | ORAL | Status: DC | PRN
  Administered 2018-03-09: 1 via ORAL
  Administered 2018-03-10: 2 via ORAL
  Filled 2018-03-09: qty 2
  Filled 2018-03-09: qty 1

## 2018-03-09 MED ORDER — DOCUSATE SODIUM 100 MG PO CAPS: 100.0000 mg | ORAL_CAPSULE | Freq: Two times a day (BID) | ORAL | 0 refills | Status: DC

## 2018-03-09 MED ORDER — METOCLOPRAMIDE HCL 5 MG/ML IJ SOLN: 5.0000 mg | Freq: Three times a day (TID) | INTRAMUSCULAR | Status: DC | PRN

## 2018-03-09 MED ORDER — DEXAMETHASONE SODIUM PHOSPHATE 10 MG/ML IJ SOLN: 10.0000 mg | Freq: Once | INTRAMUSCULAR | Status: DC

## 2018-03-09 MED ORDER — SERTRALINE HCL 100 MG PO TABS
100.0000 mg | ORAL_TABLET | Freq: Every evening | ORAL | Status: DC
  Administered 2018-03-09: 100 mg via ORAL
  Filled 2018-03-09: qty 1

## 2018-03-09 MED ORDER — DIPHENHYDRAMINE HCL 12.5 MG/5ML PO ELIX: 12.5000 mg | ORAL_SOLUTION | ORAL | Status: DC | PRN

## 2018-03-09 MED ORDER — HYDROCODONE-ACETAMINOPHEN 7.5-325 MG PO TABS: 1.0000 | ORAL_TABLET | ORAL | 0 refills | Status: DC | PRN

## 2018-03-09 MED ORDER — ACETAMINOPHEN 325 MG PO TABS: 325.0000 mg | ORAL_TABLET | Freq: Four times a day (QID) | ORAL | Status: DC | PRN

## 2018-03-09 MED ORDER — CEFAZOLIN SODIUM-DEXTROSE 2-4 GM/100ML-% IV SOLN
2.0000 g | INTRAVENOUS | Status: AC
  Administered 2018-03-09: 2 g via INTRAVENOUS
  Filled 2018-03-09: qty 100

## 2018-03-09 MED ORDER — DEXAMETHASONE SODIUM PHOSPHATE 10 MG/ML IJ SOLN
10.0000 mg | Freq: Once | INTRAMUSCULAR | Status: AC
  Administered 2018-03-09: 5 mg via INTRAVENOUS

## 2018-03-09 MED ORDER — MIDAZOLAM HCL 5 MG/5ML IJ SOLN
INTRAMUSCULAR | Status: DC | PRN
  Administered 2018-03-09: 2 mg via INTRAVENOUS

## 2018-03-09 MED ORDER — BUPIVACAINE-EPINEPHRINE (PF) 0.5% -1:200000 IJ SOLN
INTRAMUSCULAR | Status: DC | PRN
  Administered 2018-03-09: 30 mL via PERINEURAL

## 2018-03-09 MED ORDER — PROPOFOL 500 MG/50ML IV EMUL
INTRAVENOUS | Status: DC | PRN
  Administered 2018-03-09: 75 ug/kg/min via INTRAVENOUS

## 2018-03-09 MED ORDER — FENTANYL CITRATE (PF) 100 MCG/2ML IJ SOLN
INTRAMUSCULAR | Status: DC | PRN
  Administered 2018-03-09: 100 ug via INTRAVENOUS

## 2018-03-09 MED ORDER — OXYCODONE HCL 5 MG/5ML PO SOLN
5.0000 mg | Freq: Once | ORAL | Status: DC | PRN
  Filled 2018-03-09: qty 5

## 2018-03-09 MED ORDER — BISACODYL 10 MG RE SUPP: 10.0000 mg | Freq: Every day | RECTAL | Status: DC | PRN

## 2018-03-09 MED ORDER — CHLORHEXIDINE GLUCONATE 4 % EX LIQD: 60.0000 mL | Freq: Once | CUTANEOUS | Status: DC

## 2018-03-09 MED ORDER — ALUM & MAG HYDROXIDE-SIMETH 200-200-20 MG/5ML PO SUSP: 15.0000 mL | ORAL | Status: DC | PRN

## 2018-03-09 MED ORDER — BUPIVACAINE-EPINEPHRINE (PF) 0.25% -1:200000 IJ SOLN
INTRAMUSCULAR | Status: AC
  Filled 2018-03-09: qty 30

## 2018-03-09 MED ORDER — SODIUM CHLORIDE 0.9 % IR SOLN
Status: DC | PRN
  Administered 2018-03-09: 4000 mL

## 2018-03-09 MED ORDER — MAGNESIUM CITRATE PO SOLN: 1.0000 | Freq: Once | ORAL | Status: DC | PRN

## 2018-03-09 MED ORDER — ASPIRIN 81 MG PO CHEW
81.0000 mg | CHEWABLE_TABLET | Freq: Two times a day (BID) | ORAL | Status: DC
  Administered 2018-03-10: 81 mg via ORAL
  Filled 2018-03-09: qty 1

## 2018-03-09 MED ORDER — EPHEDRINE SULFATE 50 MG/ML IJ SOLN
INTRAMUSCULAR | Status: DC | PRN
  Administered 2018-03-09: 10 mg via INTRAVENOUS

## 2018-03-09 MED ORDER — CELECOXIB 200 MG PO CAPS
200.0000 mg | ORAL_CAPSULE | Freq: Two times a day (BID) | ORAL | Status: DC
  Administered 2018-03-09 – 2018-03-10 (×2): 200 mg via ORAL
  Filled 2018-03-09 (×2): qty 1

## 2018-03-09 MED ORDER — MENTHOL 3 MG MT LOZG: 1.0000 | LOZENGE | OROMUCOSAL | Status: DC | PRN

## 2018-03-09 MED ORDER — HYDROCODONE-ACETAMINOPHEN 7.5-325 MG PO TABS
1.0000 | ORAL_TABLET | ORAL | Status: DC | PRN
  Administered 2018-03-10 (×2): 2 via ORAL
  Filled 2018-03-09 (×2): qty 2

## 2018-03-09 MED ORDER — FENTANYL CITRATE (PF) 100 MCG/2ML IJ SOLN: 25.0000 ug | INTRAMUSCULAR | Status: DC | PRN

## 2018-03-09 MED ORDER — ALPRAZOLAM 1 MG PO TABS
1.0000 mg | ORAL_TABLET | Freq: Two times a day (BID) | ORAL | Status: DC
  Administered 2018-03-09 – 2018-03-10 (×2): 1 mg via ORAL
  Filled 2018-03-09 (×2): qty 1

## 2018-03-09 MED ORDER — ASPIRIN 81 MG PO CHEW: 81.0000 mg | CHEWABLE_TABLET | Freq: Two times a day (BID) | ORAL | 0 refills | Status: AC

## 2018-03-09 MED ORDER — KETOROLAC TROMETHAMINE 30 MG/ML IJ SOLN
INTRAMUSCULAR | Status: DC | PRN
  Administered 2018-03-09: 30 mg

## 2018-03-09 MED ORDER — TRANEXAMIC ACID 1000 MG/10ML IV SOLN
1000.0000 mg | Freq: Once | INTRAVENOUS | Status: AC
  Administered 2018-03-09: 1000 mg via INTRAVENOUS
  Filled 2018-03-09: qty 1100

## 2018-03-09 MED ORDER — PROPOFOL 500 MG/50ML IV EMUL
INTRAVENOUS | Status: DC | PRN
  Administered 2018-03-09: 30 mg via INTRAVENOUS

## 2018-03-09 MED ORDER — PHENYLEPHRINE HCL 10 MG/ML IJ SOLN
INTRAMUSCULAR | Status: DC | PRN
  Administered 2018-03-09: 80 ug via INTRAVENOUS

## 2018-03-09 MED ORDER — BUPIVACAINE-EPINEPHRINE (PF) 0.25% -1:200000 IJ SOLN
INTRAMUSCULAR | Status: DC | PRN
  Administered 2018-03-09: 30 mL

## 2018-03-09 MED ORDER — DOCUSATE SODIUM 100 MG PO CAPS
100.0000 mg | ORAL_CAPSULE | Freq: Two times a day (BID) | ORAL | Status: DC
  Administered 2018-03-09 – 2018-03-10 (×2): 100 mg via ORAL
  Filled 2018-03-09 (×2): qty 1

## 2018-03-09 MED ORDER — LACTATED RINGERS IV SOLN
INTRAVENOUS | Status: DC
  Administered 2018-03-09 (×3): via INTRAVENOUS

## 2018-03-09 MED ORDER — POLYETHYLENE GLYCOL 3350 17 G PO PACK: 17.0000 g | PACK | Freq: Two times a day (BID) | ORAL | 0 refills | Status: DC

## 2018-03-09 MED ORDER — FENTANYL CITRATE (PF) 100 MCG/2ML IJ SOLN
100.0000 ug | Freq: Once | INTRAMUSCULAR | Status: AC
  Administered 2018-03-09: 50 ug via INTRAVENOUS
  Filled 2018-03-09: qty 2

## 2018-03-09 MED ORDER — HYDROMORPHONE HCL 1 MG/ML IJ SOLN
0.5000 mg | INTRAMUSCULAR | Status: DC | PRN
  Administered 2018-03-10: 1 mg via INTRAVENOUS
  Filled 2018-03-09: qty 1

## 2018-03-09 MED ORDER — STERILE WATER FOR IRRIGATION IR SOLN
Status: DC | PRN
  Administered 2018-03-09: 2000 mL

## 2018-03-09 MED ORDER — ONDANSETRON HCL 4 MG/2ML IJ SOLN
INTRAMUSCULAR | Status: DC | PRN
  Administered 2018-03-09: 4 mg via INTRAVENOUS

## 2018-03-09 MED ORDER — PROMETHAZINE HCL 25 MG/ML IJ SOLN: 6.2500 mg | INTRAMUSCULAR | Status: DC | PRN

## 2018-03-09 MED ORDER — MIDAZOLAM HCL 2 MG/2ML IJ SOLN
2.0000 mg | Freq: Once | INTRAMUSCULAR | Status: AC
  Administered 2018-03-09: 1 mg via INTRAVENOUS
  Filled 2018-03-09: qty 2

## 2018-03-09 MED ORDER — METHOCARBAMOL 500 MG PO TABS: 500.0000 mg | ORAL_TABLET | Freq: Four times a day (QID) | ORAL | 0 refills | Status: DC | PRN

## 2018-03-09 MED ORDER — POLYETHYLENE GLYCOL 3350 17 G PO PACK
17.0000 g | PACK | Freq: Two times a day (BID) | ORAL | Status: DC
  Administered 2018-03-09 – 2018-03-10 (×2): 17 g via ORAL
  Filled 2018-03-09 (×2): qty 1

## 2018-03-09 MED ORDER — CEFAZOLIN SODIUM-DEXTROSE 2-4 GM/100ML-% IV SOLN
2.0000 g | Freq: Four times a day (QID) | INTRAVENOUS | Status: AC
  Administered 2018-03-09 – 2018-03-10 (×2): 2 g via INTRAVENOUS
  Filled 2018-03-09 (×2): qty 100

## 2018-03-09 MED ORDER — 0.9 % SODIUM CHLORIDE (POUR BTL) OPTIME
TOPICAL | Status: DC | PRN
  Administered 2018-03-09: 1000 mL

## 2018-03-09 MED ORDER — METHOCARBAMOL 500 MG PO TABS
500.0000 mg | ORAL_TABLET | Freq: Four times a day (QID) | ORAL | Status: DC | PRN
Start: 2018-03-09 — End: 2018-03-10
  Administered 2018-03-09 – 2018-03-10 (×3): 500 mg via ORAL
  Filled 2018-03-09 (×2): qty 1

## 2018-03-09 SURGICAL SUPPLY — 62 items
ADAPTER BOLT FEMORAL +2/-2 (Knees) ×2 IMPLANT
ADPR FEM +2/-2 OFST BOLT (Knees) ×1 IMPLANT
ADPR FEM 5D STRL KN PFC SGM (Orthopedic Implant) ×1 IMPLANT
BAG SPEC THK2 15X12 ZIP CLS (MISCELLANEOUS) ×1
BAG ZIPLOCK 12X15 (MISCELLANEOUS) ×3 IMPLANT
BANDAGE ACE 6X5 VEL STRL LF (GAUZE/BANDAGES/DRESSINGS) ×2 IMPLANT
BLADE SAW SGTL 11.0X1.19X90.0M (BLADE) ×2 IMPLANT
BNDG GAUZE ELAST 4 BULKY (GAUZE/BANDAGES/DRESSINGS) ×3 IMPLANT
BUR OVAL CARBIDE 4.0 (BURR) ×2 IMPLANT
CABLE ASSY CERCLAGE SST 1.8X55 (Orthopedic Implant) ×2 IMPLANT
CABLE CERLAGE W/CRIMP 1.8 (Cable) ×3 IMPLANT
CABLE CERLAGE W/CRIMP 1.8MM (Cable) ×3 IMPLANT
CABLE FOR BONE PLATE (Cable) IMPLANT
CEMENT HV SMART SET (Cement) ×4 IMPLANT
COMP FEM CEM LT SZ4 (Orthopedic Implant) ×3 IMPLANT
COMPONENT FEM CEM LT SZ4 (Orthopedic Implant) IMPLANT
COVER SURGICAL LIGHT HANDLE (MISCELLANEOUS) ×3 IMPLANT
DRAPE C-ARM 42X120 X-RAY (DRAPES) ×5 IMPLANT
DRAPE C-ARMOR (DRAPES) ×3 IMPLANT
DRAPE EXTREMITY T 121X128X90 (DRAPE) IMPLANT
DRAPE ORTHO SPLIT 77X108 STRL (DRAPES)
DRAPE SURG ORHT 6 SPLT 77X108 (DRAPES) IMPLANT
DRSG AQUACEL AG ADV 3.5X14 (GAUZE/BANDAGES/DRESSINGS) ×2 IMPLANT
DRSG KUZMA FLUFF (GAUZE/BANDAGES/DRESSINGS) ×2 IMPLANT
DURAPREP 26ML APPLICATOR (WOUND CARE) ×3 IMPLANT
ELECT REM PT RETURN 15FT ADLT (MISCELLANEOUS) ×3 IMPLANT
FEMORAL ADAPTER (Orthopedic Implant) ×2 IMPLANT
GAUZE SPONGE 4X4 12PLY STRL (GAUZE/BANDAGES/DRESSINGS) ×3 IMPLANT
GAUZE XEROFORM 5X9 LF (GAUZE/BANDAGES/DRESSINGS) ×2 IMPLANT
GLOVE BIO SURGEON STRL SZ7.5 (GLOVE) ×2 IMPLANT
GLOVE BIOGEL PI IND STRL 7.5 (GLOVE) ×1 IMPLANT
GLOVE BIOGEL PI IND STRL 8.5 (GLOVE) ×1 IMPLANT
GLOVE BIOGEL PI INDICATOR 7.5 (GLOVE) ×10
GLOVE BIOGEL PI INDICATOR 8.5 (GLOVE) ×2
GLOVE ECLIPSE 8.0 STRL XLNG CF (GLOVE) ×4 IMPLANT
GLOVE ORTHO TXT STRL SZ7.5 (GLOVE) ×6 IMPLANT
GOWN STRL REUS W/TWL LRG LVL3 (GOWN DISPOSABLE) ×5 IMPLANT
GOWN STRL REUS W/TWL XL LVL3 (GOWN DISPOSABLE) ×6 IMPLANT
IMMOBILIZER KNEE 20 (SOFTGOODS) ×2 IMPLANT
INSERT TIBIAL RC3 RP SZ 4 30.0 (Insert) ×2 IMPLANT
KIT BASIN OR (CUSTOM PROCEDURE TRAY) ×3 IMPLANT
MANIFOLD NEPTUNE II (INSTRUMENTS) ×3 IMPLANT
NDL SAFETY ECLIPSE 18X1.5 (NEEDLE) IMPLANT
NEEDLE HYPO 18GX1.5 SHARP (NEEDLE) ×3
PACK TOTAL JOINT (CUSTOM PROCEDURE TRAY) ×3 IMPLANT
PAD ABD 8X10 STRL (GAUZE/BANDAGES/DRESSINGS) ×4 IMPLANT
POSITIONER SURGICAL ARM (MISCELLANEOUS) ×3 IMPLANT
REAMER TREPHINE  8X19 (ORTHOPEDIC SUPPLIES)
REAMER TREPHINE 8X19 (ORTHOPEDIC SUPPLIES) IMPLANT
Reamer Trephine 8*19 ×2 IMPLANT
SLEEVE UNIV FEM FUL PRO SZ46MM (Sleeve) ×2 IMPLANT
STAPLER VISISTAT 35W (STAPLE) ×5 IMPLANT
STEM UNIVERSAL FLUTED 150X14MM (Shell) ×2 IMPLANT
SUT VIC AB 1 CT1 36 (SUTURE) ×8 IMPLANT
SUT VIC AB 2-0 CT1 27 (SUTURE) ×6
SUT VIC AB 2-0 CT1 TAPERPNT 27 (SUTURE) ×2 IMPLANT
SYR 3ML LL SCALE MARK (SYRINGE) ×2 IMPLANT
TOWEL OR 17X26 10 PK STRL BLUE (TOWEL DISPOSABLE) ×6 IMPLANT
TREPHINE REAMER 8X20 (ORTHOPEDIC SUPPLIES) ×2 IMPLANT
TREPHINE REAMER 8X21 (ORTHOPEDIC SUPPLIES) ×2 IMPLANT
TREPHINE REAMER 8X22 (ORTHOPEDIC SUPPLIES) ×2 IMPLANT
WRAP KNEE MAXI GEL POST OP (GAUZE/BANDAGES/DRESSINGS) ×2 IMPLANT

## 2018-03-09 NOTE — Transfer of Care (Signed)
Immediate Anesthesia Transfer of Care Note  Patient: Justin Hurst  Procedure(s) Performed: Procedure(s) with comments: Left knee femoral revision with open reduction internal fixation of distal femur periprosthetic fracture (Left) - Adductor Block  Patient Location: PACU  Anesthesia Type:Regional and Spinal  Level of Consciousness:  sedated, patient cooperative and responds to stimulation  Airway & Oxygen Therapy:Patient Spontanous Breathing and Patient connected to face mask oxgen  Post-op Assessment:  Report given to PACU RN and Post -op Vital signs reviewed and stable  Post vital signs:  Reviewed and stable  Last Vitals:  Vitals:   03/09/18 1242 03/09/18 1243  BP:  113/71  Pulse: 61 61  Resp: 16 12  Temp:    SpO2: 53% 66%    Complications: No apparent anesthesia complications

## 2018-03-09 NOTE — Brief Op Note (Signed)
03/09/2018  4:57 PM  PATIENT:  Justin Hurst  62 y.o. male  PRE-OPERATIVE DIAGNOSIS:  Left knee periprosthetic fracture with failed femoral component  POST-OPERATIVE DIAGNOSIS:  Left knee periprosthetic fracture with failed femoral component  PROCEDURE:  Procedure(s) with comments: Left knee femoral revision with open reduction internal fixation of distal femur periprosthetic fracture (Left) - Adductor Block  SURGEON:  Surgeon(s) and Role:    Paralee Cancel, MD - Primary  PHYSICIAN ASSISTANT: Danae Orleans, PA-C  ANESTHESIA:   regional and spinal  EBL:  200 mL   BLOOD ADMINISTERED:none  DRAINS: none   LOCAL MEDICATIONS USED:  MARCAINE     SPECIMEN:  No Specimen  DISPOSITION OF SPECIMEN:  N/A  COUNTS:  YES  TOURNIQUET:   Total Tourniquet Time Documented: Thigh (Left) - 90 minutes Thigh (Left) - 48 minutes Total: Thigh (Left) - 138 minutes   DICTATION: .Other Dictation: Dictation Number [id number]  PLAN OF CARE: Admit to inpatient   PATIENT DISPOSITION:  PACU - hemodynamically stable.   Delay start of Pharmacological VTE agent (>24hrs) due to surgical blood loss or risk of bleeding: no

## 2018-03-09 NOTE — Anesthesia Preprocedure Evaluation (Addendum)
Anesthesia Evaluation  Patient identified by MRN, date of birth, ID band Patient awake    Reviewed: Allergy & Precautions, NPO status , Patient's Chart, lab work & pertinent test results  History of Anesthesia Complications Negative for: history of anesthetic complications  Airway Mallampati: II  TM Distance: >3 FB Neck ROM: Full    Dental  (+) Dental Advisory Given   Pulmonary neg pulmonary ROS,    breath sounds clear to auscultation       Cardiovascular negative cardio ROS   Rhythm:Regular Rate:Normal     Neuro/Psych Anxiety negative neurological ROS     GI/Hepatic negative GI ROS, Neg liver ROS,   Endo/Other  negative endocrine ROS  Renal/GU negative Renal ROS  negative genitourinary   Musculoskeletal  (+) Arthritis ,   Abdominal   Peds  Hematology negative hematology ROS (+)   Anesthesia Other Findings   Reproductive/Obstetrics                             Anesthesia Physical Anesthesia Plan  ASA: II  Anesthesia Plan: Spinal   Post-op Pain Management:  Regional for Post-op pain   Induction:   PONV Risk Score and Plan: 1 and Treatment may vary due to age or medical condition and Propofol infusion  Airway Management Planned: Natural Airway and Simple Face Mask  Additional Equipment: None  Intra-op Plan:   Post-operative Plan:   Informed Consent: I have reviewed the patients History and Physical, chart, labs and discussed the procedure including the risks, benefits and alternatives for the proposed anesthesia with the patient or authorized representative who has indicated his/her understanding and acceptance.     Plan Discussed with: CRNA and Anesthesiologist  Anesthesia Plan Comments:         Anesthesia Quick Evaluation

## 2018-03-09 NOTE — Anesthesia Postprocedure Evaluation (Signed)
Anesthesia Post Note  Patient: Justin Hurst  Procedure(s) Performed: Left knee femoral revision with open reduction internal fixation of distal femur periprosthetic fracture (Left Knee)     Patient location during evaluation: PACU Anesthesia Type: Spinal Level of consciousness: awake and alert Pain management: pain level controlled Vital Signs Assessment: post-procedure vital signs reviewed and stable Respiratory status: spontaneous breathing and respiratory function stable Cardiovascular status: blood pressure returned to baseline and stable Postop Assessment: spinal receding and no apparent nausea or vomiting Anesthetic complications: no    Last Vitals:  Vitals:   03/09/18 1945 03/09/18 2056  BP: 130/84 119/86  Pulse: 60 77  Resp: 15 15  Temp: 36.5 C 36.5 C  SpO2: 99% 100%    Last Pain:  Vitals:   03/09/18 2100  TempSrc:   PainSc: [place]

## 2018-03-09 NOTE — Progress Notes (Signed)
AssistedDr. Brock with left, ultrasound guided, adductor canal block. Side rails up, monitors on throughout procedure. See vital signs in flow sheet. Tolerated Procedure well.  

## 2018-03-09 NOTE — Anesthesia Procedure Notes (Signed)
Spinal  Start time: 03/09/2018 1:30 PM End time: 03/09/2018 1:35 PM Staffing Resident/CRNA: Gean Maidens, CRNA Performed: resident/CRNA  Preanesthetic Checklist Completed: patient identified, site marked, surgical consent, pre-op evaluation, timeout performed, IV checked, risks and benefits discussed and monitors and equipment checked Spinal Block Patient position: sitting Prep: Betadine Patient monitoring: heart rate, continuous pulse ox and blood pressure Approach: midline Location: L4-5 Injection technique: single-shot Needle Needle type: Quincke  Needle gauge: 22 G Needle length: 9 cm Needle insertion depth: 7 cm Additional Notes Pt sitting position, sterile prep and drape, negative paresthesia/heme

## 2018-03-09 NOTE — Anesthesia Procedure Notes (Signed)
Anesthesia Regional Block: Adductor canal block   Pre-Anesthetic Checklist: ,, timeout performed, Correct Patient, Correct Site, Correct Laterality, Correct Procedure, Correct Position, risks and benefits discussed, surgical consent, pre-op evaluation,  At surgeon's request and post-op pain management  Laterality: Left  Prep: chloraprep       Needles:  Injection technique: Single-shot  Needle Type: Echogenic Needle     Needle Length: 10cm  Needle Gauge: 21     Additional Needles:   Narrative:  Start time: 03/09/2018 12:35 PM End time: 03/09/2018 12:38 PM Injection made incrementally with aspirations every 5 mL.  Performed by: Personally  Anesthesiologist: Audry Pili, MD  Additional Notes: No pain on injection. No increased resistance to injection. Injection made in 5cc increments. Good needle visualization. Patient tolerated the procedure well.

## 2018-03-10 LAB — BASIC METABOLIC PANEL
ANION GAP: 6 (ref 5–15)
BUN: 15 mg/dL (ref 6–20)
CALCIUM: 8.1 mg/dL — AB (ref 8.9–10.3)
CO2: 25 mmol/L (ref 22–32)
CREATININE: 0.8 mg/dL (ref 0.61–1.24)
Chloride: 109 mmol/L (ref 101–111)
Glucose, Bld: 140 mg/dL — ABNORMAL HIGH (ref 65–99)
Potassium: 3.9 mmol/L (ref 3.5–5.1)
Sodium: 140 mmol/L (ref 135–145)

## 2018-03-10 LAB — CBC
HCT: 32 % — ABNORMAL LOW (ref 39.0–52.0)
Hemoglobin: 10.6 g/dL — ABNORMAL LOW (ref 13.0–17.0)
MCH: 30.9 pg (ref 26.0–34.0)
MCHC: 33.1 g/dL (ref 30.0–36.0)
MCV: 93.3 fL (ref 78.0–100.0)
PLATELETS: 223 10*3/uL (ref 150–400)
RBC: 3.43 MIL/uL — ABNORMAL LOW (ref 4.22–5.81)
RDW: 13.6 % (ref 11.5–15.5)
WBC: 9.4 10*3/uL (ref 4.0–10.5)

## 2018-03-10 NOTE — Progress Notes (Signed)
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   Follow Up Recommendations  No PT follow up     Equipment Recommendations  None recommended by PT    Recommendations for Other Services       Precautions / Restrictions Precautions Precautions: Fall;Knee Precaution Comments: OK to use Bledsoe, locked in extension, placed on patient and reviewed position with wife and patient Required Braces or Orthoses: Knee Immobilizer - Left Knee Immobilizer - Left: On at all times Restrictions Weight Bearing Restrictions: Yes LLE Weight Bearing: Partial weight bearing LLE Partial Weight Bearing Percentage or Pounds: 50%    Mobility  Bed Mobility Overal bed mobility: Needs Assistance Bed Mobility: Supine to Sit     Supine to sit: Min guard     General bed mobility comments: in recliner  Transfers Overall transfer level: Needs assistance Equipment used: Rolling walker (2 wheeled) Transfers: Sit to/from Stand Sit to Stand: Supervision         General transfer comment: cues for hand and left leg position  Ambulation/Gait Ambulation/Gait assistance: Supervision Gait Distance (Feet): 40 Feet Assistive device: Rolling walker (2 wheeled) Gait Pattern/deviations: Step-to pattern     General Gait Details: cues  for  50% WB.   Stairs Stairs: Yes Stairs assistance: Min assist Stair Management: One rail Left;Step to pattern;With crutches Number of Stairs: 5 General stair comments: cues for sequence.   Wheelchair Mobility    Modified Rankin (Stroke Patients Only)       Balance                                            Cognition Arousal/Alertness: Awake/alert Behavior During Therapy: WFL for tasks  assessed/performed Overall Cognitive Status: Within Functional Limits for tasks assessed                                        Exercises      General Comments        Pertinent Vitals/Pain Pain Assessment: 0-10 Pain Score: 2  Pain Location: left knee Pain Descriptors / Indicators: Discomfort Pain Intervention(s): Monitored during session;Premedicated before session;Ice applied    Home Living Family/patient expects to be discharged to:: Private residence Living Arrangements: Spouse/significant other Available Help at Discharge: Family Type of Home: House Home Access: Stairs to enter Entrance Stairs-Rails: Left Home Layout: Two level;Able to live on main level with bedroom/bathroom Home Equipment: Walker - standard;Crutches      Prior Function Level of Independence: Independent with assistive device(s)          PT Goals (current goals can now be found in the care plan section) Acute Rehab PT Goals Patient Stated Goal: to go home PT Goal Formulation: With patient Time For Goal Achievement: 03/12/18 Potential to Achieve Goals: Good Progress towards PT goals: Progressing toward goals    Frequency    7X/week      PT Plan Current plan remains appropriate    Co-evaluation              AM-PAC PT "6 Clicks" Daily  Activity  Outcome Measure  Difficulty turning over in bed (including adjusting bedclothes, sheets and blankets)?: A Little Difficulty moving from lying on back to sitting on the side of the bed? : A Little Difficulty sitting down on and standing up from a chair with arms (e.g., wheelchair, bedside commode, etc,.)?: A Little Help needed moving to and from a bed to chair (including a wheelchair)?: A Little Help needed walking in hospital room?: A Little Help needed climbing 3-5 steps with a railing? : A Lot 6 Click Score: 17    End of Session   Activity Tolerance: Patient tolerated treatment well Patient left: in chair;with call  bell/phone within reach;with family/visitor present Nurse Communication: Mobility status PT Visit Diagnosis: Unsteadiness on feet (R26.81)     Time: 0349-1791 PT Time Calculation (min) (ACUTE ONLY): 9 min  Charges:  $Gait Training: 8-22 mins                    G CodesTresa Endo PT [phone]    Claretha Cooper 03/10/2018, 12:51 PM

## 2018-03-10 NOTE — Progress Notes (Signed)
Patient ID: Justin Hurst, male   DOB: [birth year], 62 y.o.   MRN: [id number] Subjective: 1 Day Post-Op Procedure(s) (LRB): Left knee femoral revision with open reduction internal fixation of distal femur periprosthetic fracture (Left)    Patient reports pain as moderate.  Up comfortably this am eating breakfast however reports needing IV Dilaudid last night once.  No events.  Very pleasant disposition.  Objective:   VITALS:   Vitals:   03/10/18 0122 03/10/18 0509  BP: 104/76 101/67  Pulse: 67 62  Resp: 16 15  Temp: 97.8 F (36.6 C) (!) 97.3 F (36.3 C)  SpO2: 98% 99%    Neurovascular intact Incision: dressing C/D/I  LABS Recent Labs    03/10/18 0551  HGB 10.6*  HCT 32.0*  WBC 9.4  PLT 223    Recent Labs    03/10/18 0551  NA 140  K 3.9  BUN 15  CREATININE 0.80  GLUCOSE 140*    No results for input(s): LABPT, INR in the last 72 hours.   Assessment/Plan: 1 Day Post-Op Procedure(s) (LRB): Left knee femoral revision with open reduction internal fixation of distal femur periprosthetic fracture (Left)   Advance diet Up with therapy   Reviewed intra-operative findings and post-op plans Limited weight bearing for 6- 12 weeks Knee brace to intentionally limit flexion in attempt to get healing of femoral peri-prosthetic femur fracture May go home today if progresses well with therapy versus tomorrow

## 2018-03-10 NOTE — Progress Notes (Signed)
RN reviewed discharge instructions with patient and family. All questions answered.   Paperwork and prescriptions given.   NT rolled patient down with all belongings to family car. 

## 2018-03-10 NOTE — Op Note (Signed)
NAME: Justin Hurst, Justin W. 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  POSTOPERATIVE DIAGNOSES: 1.  Distal periprosthetic femur fracture. 2.  Failed left femoral component. 3.  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  PROCEDURES: 1.  Revision left total knee replacement with revision of the femoral component to a size 4 left TC3 femoral component with a 46 mm press-fit sleeve, 5-degree adapter, +2 bolt, 150 mm x 14  mm press-fit stem, and a 30 mm TC3 insert. 2.  Open reduction internal fixation of left distal periprosthetic femur fracture utilizing Zimmer cables. 3.  Significant excisional and nonexcisional debridement of the entire synovium of the knee as well as nonviable bone related to the metalosis.  FINDINGS:  Included below in body of operative report, but also included the patella button had failed, and the patella was extremely thin and was not revisable.  SURGEON:  Paralee Cancel, MD  ASSISTANT:  Danae Orleans PA-C.  Note that [name] was present for the entirety of the case and for preoperative positioning, perioperative management of the operative extremity, general facilitation of the case, and primary wound closure.  ANESTHESIA:  Preoperative regional block plus spinal block anesthesia.  ESTIMATED BLOOD LOSS:  About 200 mL.  TOURNIQUET:  Up for a total of 138 minutes where the period of time after 90 minutes, it was let down prior to reinflating for the remainder of the case.  DRAINS:  None.  INDICATIONS FOR PROCEDURE:  The patient is a very pleasant 62 year old male with a history of  revision left total knee replacement done in 2011.  He had presented to the office within the last few months, noting pain in his left knee.  There was no  report of any antecedent trauma.  Radiographs revealed a fracture of his femoral stem, in addition to a medial metaphyseal periprosthetic fracture.  He was seen and evaluated at the time, and indications for surgery were obvious and reviewed.  Risks were  discussed in terms of operative intervention.  Unfortunately, surgery was delayed due to the fact that his daughter was getting married, and we could not predict time of the surgery around that time.  His daughter apparently had a great wedding, and he  was now ready to proceed.  PROCEDURE IN DETAIL:  Risks including risk of infection, DVT component failure, need for future or revision surgeries based on intraoperative findings.  PROCEDURE IN DETAIL:  The patient was brought to the operative theater.  Once adequate anesthesia, preoperative antibiotics, Ancef administered, tranexamic acid and Decadron, he was positioned supine.  A left thigh tourniquet was used.  The left lower  extremity was then prepped and draped in sterile fashion.  A timeout was performed identifying the patient, the planned procedure and extremity.  The leg was exsanguinated and tourniquet elevated to 250 mmHg.  His midline incision was excised.  Soft  tissue planes were created.  Median arthrotomy was performed, encountering a bloody seromatous fluid.  Once I opened up the joint, it was quite obvious the extent of his significant metal-staining synovium.  Thus, this first portion of the case was performed and a complete synovectomy of the metal-stained synovium.  This was  in the medial and lateral aspect of the joints as well as suprapatellar.  I did work on this through the entirety of the case at  various times.  Once the knee was exposed and the knee flexed, the femoral component was completely loose and popped right off  the joint.  We continued our debridement of the nonviable bone related to the metalosis as well as the soft tissues.  I identified the metaphyseal fracture of the distal medial femoral condyle.  Once this was exposed, I did place a single cable.  At this point, I had to make a decision on how to proceed.  Given the extent of involvement of the distal femur, I am very  worried that the apparatus that would be utilized would ultimately fail, requiring revising him to a more constrained device; however, based on his age, I felt that it was in his best interest to provide the least amount of constraint as possible.  I  proceeded with the TC3 revision.  Once I removed the femoral component and some further debridement distally, we had to get the cemented femoral stem that had broken off.  We had ordered in trephines.  I ended up using a 20 mm trephine to get over the cement mantle.  I was able to get  this down about 60 mm very close to the end of the cemented stem and including the plastic cement restrictor.  The femoral stem was then removed from the cement mantle as it loosened.  I then was able to use the [facility] cemented system to remove the  remaining cement and the plastic restrictor.  Once this was done, we irrigated the canal with normal saline with pulse lavage with a canal brush irrigator.  I then reamed with a 12 mm and then 13 and up to 14 mm for 150 mm stem to bypass the area where  the cement mantle was.  We then evaluated the distal femur and felt that the size 46 broach for this sleeve was the only one that would fit.  I did do a trial reduction, and based on the complete synovectomy, the extension and flexion gaps seemed to be best matched up to a size 30 mm insert.  There was no way for me to adjust this on the tibia side as the tibial component was stable despite  having significant metalosis around the medial side of it as well.  Given all these findings, we went ahead and opened up  the final components, which were configured on the back table under my direct supervision.  We did mix some cement.  We copiously irrigated the femur with 3 L of normal saline solution at this point.  Once the knee was cleaned and dried, we mixed cement.  I did place some cement on the distal aspect of the femur.  The area was quite void, and the primary source of fixation is going to be at the femoral stem and the sleeve more distal.  I impacted this  down to the level where we had placed the broach, the trial components, and placed a 30 mm insert and brought the knee to extension until the cement fully cured.  Again, the tourniquet had been let down to allow for hemostasis as needed.  In particular,  due to the extensive synovectomy and debridement in the posterior aspect of the joint, I wanted to make sure there was no concern with popliteal vessel bleeding.  Once the cement had fully cured,  I continued to work on evaluation of the periprosthetic fracture.  Fluoroscopy was brought to the field, confirming the depth of penetration with the 150 mm stem without evidence of fracture.  I proceeded to place 3 more cables for a total of 4 cables along the distal aspect of the femur for this metaphyseal fracture.  Again, this was confirmed radiographically when done.  These were all passed without apparent complication, tensioned, and locked it down and crimped and cut.  On further evaluation of the knee, upon eversion of the patella, I found that the patella button just popped off of its space.  There was significant metalosis around the patella, which was debrided, as well as was the bone.  I am worried that the  remaining patellar bone was extremely thin and basically nonviable.  I did use a high-speed bur during the case to remove some of the metal-stained bone, as well as around the distal femur and patella.  I, at this point, felt that there was no way for [place] to revise his patella and thus left him  with a bony shell.  The knee was reirrigated with another liter of normal saline solution.  The final 30 mm insert to match the 4 femur was opened and then placed into the knee.  The knee was reduced.  Then, with the knee at 40 degrees of flexion, we reapproximated the  extensor mechanism using #1 Vicryl and #1 Stratafix suture.  The remainder of the wound was closed with 2-0 Vicryl and staples on the skin.  The skin was clean, dry and dressed sterilely with a bulky dressing.  Postoperatively, I am going to have him be partial weightbearing.  I will most likely have him use a knee brace locked in extension.  I am worried about the long-term outcome of this knee based on the bone quality related to the metalosis and the  appearance of the soft tissues and bone intraoperatively.  I worry about this because of the alternatives.  I worry about this based on his age and what the full constraint hinged knee replacement will due for him short and long term.  My hope is that we  can buy some needed time with this component.  I will probably be asking him to diminish his activity as much as possible to prevent early complication.  LN/NUANCE  D:03/09/2018 T:03/10/2018 JOB:[id number]

## 2018-03-10 NOTE — Plan of Care (Signed)
Plan of care for post op day one discussed with patient and wife. All questions answered.

## 2018-03-10 NOTE — Evaluation (Signed)
Physical Therapy Evaluation Patient Details Name: TAN CLOPPER MRN: [id number] DOB: 08-31-1956 Today's Date: 03/10/2018   History of Present Illness  Revision  LTKA due to failed and  periprosthetic femur fracture, ORIF, H/O bil ankle fusions  Clinical Impression  The patient ambulated well. Has been on walker and crutches PTA. Plans to practice steps and Dc Pt admitted with above diagnosis. Pt currently with functional limitations due to the deficits listed below (see PT Problem List).  Pt will benefit from skilled PT to increase their independence and safety with mobility to allow discharge to the venue listed below.       Follow Up Recommendations No PT follow up    Equipment Recommendations  None recommended by PT    Recommendations for Other Services       Precautions / Restrictions Precautions Precautions: Fall;Knee Precaution Comments: OK to use Bledsoe, locked in extension, placed on patient and reviewed position with wife and patient Required Braces or Orthoses: Knee Immobilizer - Left Knee Immobilizer - Left: On at all times Restrictions Weight Bearing Restrictions: Yes LLE Weight Bearing: Partial weight bearing LLE Partial Weight Bearing Percentage or Pounds: 50%      Mobility  Bed Mobility Overal bed mobility: Needs Assistance Bed Mobility: Supine to Sit     Supine to sit: Min guard        Transfers Overall transfer level: Needs assistance Equipment used: Rolling walker (2 wheeled) Transfers: Sit to/from Stand Sit to Stand: Min guard         General transfer comment: cues for hand and left leg position  Ambulation/Gait Ambulation/Gait assistance: Min assist Gait Distance (Feet): 80 Feet Assistive device: Rolling walker (2 wheeled) Gait Pattern/deviations: Step-to pattern     General Gait Details: cues  for  50% WB.  Stairs            Wheelchair Mobility    Modified Rankin (Stroke Patients Only)       Balance                                              Pertinent Vitals/Pain Pain Assessment: 0-10 Pain Score: 2  Pain Location: left knee Pain Descriptors / Indicators: Discomfort Pain Intervention(s): Monitored during session;Premedicated before session;Limited activity within patient's tolerance    Home Living Family/patient expects to be discharged to:: Private residence Living Arrangements: Spouse/significant other Available Help at Discharge: Family Type of Home: House Home Access: Stairs to enter Entrance Stairs-Rails: Left Entrance Stairs-Number of Steps: 15 Home Layout: Two level;Able to live on main level with bedroom/bathroom Home Equipment: Gilford Rile - standard;Crutches      Prior Function Level of Independence: Independent with assistive device(s)               Hand Dominance        Extremity/Trunk Assessment   Upper Extremity Assessment Upper Extremity Assessment: Overall WFL for tasks assessed    Lower Extremity Assessment Lower Extremity Assessment: LLE deficits/detail LLE Deficits / Details: KI in place, decreaseddorsiflexion from ankle fusion       Communication   Communication: No difficulties  Cognition Arousal/Alertness: Awake/alert Behavior During Therapy: WFL for tasks assessed/performed Overall Cognitive Status: Within Functional Limits for tasks assessed  General Comments      Exercises     Assessment/Plan    PT Assessment Patient needs continued PT services  PT Problem List Decreased strength;Decreased range of motion;Decreased knowledge of use of DME;Decreased activity tolerance;Decreased mobility       PT Treatment Interventions DME instruction;Therapeutic exercise;Gait training;Stair training;Functional mobility training    PT Goals (Current goals can be found in the Care Plan section)  Acute Rehab PT Goals Patient Stated Goal: to go home PT Goal Formulation: With  patient Time For Goal Achievement: 03/12/18 Potential to Achieve Goals: Good    Frequency 7X/week   Barriers to discharge        Co-evaluation               AM-PAC PT "6 Clicks" Daily Activity  Outcome Measure Difficulty turning over in bed (including adjusting bedclothes, sheets and blankets)?: A Little Difficulty moving from lying on back to sitting on the side of the bed? : A Little Difficulty sitting down on and standing up from a chair with arms (e.g., wheelchair, bedside commode, etc,.)?: A Little Help needed moving to and from a bed to chair (including a wheelchair)?: A Little Help needed walking in hospital room?: A Little Help needed climbing 3-5 steps with a railing? : A Lot 6 Click Score: 17    End of Session   Activity Tolerance: Patient tolerated treatment well Patient left: in chair;with call bell/phone within reach;with family/visitor present Nurse Communication: Mobility status PT Visit Diagnosis: Unsteadiness on feet (R26.81)    Time: 0175-1025 PT Time Calculation (min) (ACUTE ONLY): 18 min   Charges:   PT Evaluation $PT Eval Low Complexity: 1 Low     PT G CodesTresa Endo PT [phone]   Claretha Cooper 03/10/2018, 12:45 PM

## 2018-03-10 NOTE — Discharge Summary (Signed)
Physician Discharge Summary  Patient ID: Justin Hurst MRN: [id number] DOB/AGE: 1956-04-10 62 y.o.  Admit date: 03/09/2018 Discharge date: 03/10/2018   Procedures:  Procedure(s) (LRB): Left knee femoral revision with open reduction internal fixation of distal femur periprosthetic fracture (Left)  Attending Physician:  Dr. 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 Anxiety   . Arthritis   . Complication of anesthesia    caused gas    HPI:    [name], 62 y.o. male, has a history of pain and functional disability in the left knee(s) due to trauma and patient has failed non-surgical conservative treatments for greater than 12 weeks to include NSAID's and/or analgesics, use of assistive devices, weight reduction as appropriate and activity modification. The indications for the revision of the total knee arthroplasty are loosening of one or more components, fracture or mechanical failure of one or components and treatment of periprosthectic fracture of distal femur, proximal tibia or patella. Onset of symptoms was abrupt starting 2 months ago with rapidlly worsening course since that time.  Prior procedures on the left knee(s) include arthroplasty and and revision. Patient currently rates pain in the left knee(s) at 9 out of 10 with activity. There is night pain, worsening of pain with activity and weight bearing, pain that interferes with activities of daily living and pain with passive range of motion.  Patient has evidence of prosthetic loosening and periprosthetic distal femur fx by imaging studies. This condition presents safety issues increasing the risk of falls.  There is no current active infection.  PCP: Patient, No Pcp Per   Discharged Condition: good  Hospital Course:  Patient underwent the  above stated procedure on 03/09/2018. Patient tolerated the procedure well and brought to the recovery room in good condition and subsequently to the floor.  POD #1 BP: 101/67 ; Pulse: 62 ; Temp: 97.3 F (36.6 C) ; Resp: 15 Patient reports pain as moderate.  Up comfortably this am eating breakfast however reports needing IV Dilaudid last night once.  No events.  Very pleasant disposition.  Ready to be discharged home.  Neurovascular intact and incision: dressing C/D/I.   LABS  Basename    HGB     10.6  HCT     32.0     Discharge Exam: General appearance: alert, cooperative and no distress Extremities: Homans sign is negative, no sign of DVT, no edema, redness or tenderness in the calves or thighs and no ulcers, gangrene or trophic changes  Disposition: Home with follow up in 2 weeks   Follow-up Information    Paralee Cancel, MD. Schedule an appointment as soon as possible for a visit in 2 weeks.   Specialty:  Orthopedic Surgery Contact information: [address] [place] [address] [phone]           Discharge Instructions    Call MD / Call 911   Complete by:  As directed    If you experience chest pain or shortness of breath, CALL 911 and be transported to the hospital emergency room.  If you develope a fever above 101 F, pus (white drainage) or increased drainage or redness at the wound, or calf pain, call your surgeon's office.   Change dressing   Complete by:  As directed    Maintain surgical dressing  until follow up in the clinic. If the edges start to pull up, may reinforce with tape. If the dressing is no longer working, may remove and cover with gauze and tape, but must keep the area dry and clean.  ** If significant saturation of the dressing is noted call the office.  Bring your back up dressing and we will set up an appointment to return to the office to evaluate / change the dressing.  **  Call with any questions or concerns.   Constipation  Prevention   Complete by:  As directed    Drink plenty of fluids.  Prune juice may be helpful.  You may use a stool softener, such as Colace (over the counter) 100 mg twice a day.  Use MiraLax (over the counter) for constipation as needed.   Diet - low sodium heart healthy   Complete by:  As directed    Discharge instructions   Complete by:  As directed    Maintain surgical dressing until follow up in the clinic. If the edges start to pull up, may reinforce with tape. If the dressing is no longer working, may remove and cover with gauze and tape, but must keep the area dry and clean.  Follow up in 2 weeks at [facility]. Call with any questions or concerns.   Increase activity slowly as tolerated   Complete by:  As directed    Partial weight bearing with assist device as directed.  50% left lower extremity.   Partial weight bearing   Complete by:  As directed    % Body Weight:  50   Laterality:  left   Extremity:  Lower   TED hose   Complete by:  As directed    Use stockings (TED hose) for 2 weeks on both leg(s).  You may remove them at night for sleeping.      Allergies as of 03/10/2018   No Known Allergies     Medication List    STOP taking these medications   acetaminophen 500 MG tablet Commonly known as:  TYLENOL     TAKE these medications   ALPRAZolam 1 MG tablet Commonly known as:  XANAX Take 1 mg by mouth 2 (two) times daily.   aspirin 81 MG chewable tablet Commonly known as:  ASPIRIN CHILDRENS Chew 1 tablet (81 mg total) by mouth 2 (two) times daily. Take for 4 weeks, then resume regular dose.   docusate sodium 100 MG capsule Commonly known as:  COLACE Take 1 capsule (100 mg total) by mouth 2 (two) times daily.   ferrous sulfate 325 (65 FE) MG tablet Commonly known as:  FERROUSUL Take 1 tablet (325 mg total) by mouth 3 (three) times daily with meals.   HYDROcodone-acetaminophen 7.5-325 MG tablet Commonly known as:  NORCO Take 1-2 tablets by mouth  every 4 (four) hours as needed for moderate pain.   methocarbamol 500 MG tablet Commonly known as:  ROBAXIN Take 1 tablet (500 mg total) by mouth every 6 (six) hours as needed for muscle spasms.   polyethylene glycol packet Commonly known as:  MIRALAX / GLYCOLAX Take 17 g by mouth 2 (two) times daily.   sertraline 100 MG tablet Commonly known as:  ZOLOFT Take 100 mg by mouth every evening.            Discharge Care Instructions  (From admission, onward)        Start     Ordered   03/10/18 0000  Change  dressing    Comments:  Maintain surgical dressing until follow up in the clinic. If the edges start to pull up, may reinforce with tape. If the dressing is no longer working, may remove and cover with gauze and tape, but must keep the area dry and clean.  ** If significant saturation of the dressing is noted call the office.  Bring your back up dressing and we will set up an appointment to return to the office to evaluate / change the dressing.  **  Call with any questions or concerns.   03/10/18 0958   03/10/18 0000  Partial weight bearing    Question Answer Comment  % Body Weight 50   Laterality left   Extremity Lower      03/10/18 0958       Signed: West Pugh. Eloyse Causey   PA-C  03/10/2018, 1:16 PM

## 2018-03-11 ENCOUNTER — Encounter (HOSPITAL_COMMUNITY): Payer: Self-pay | Admitting: Orthopedic Surgery

## 2018-10-02 DIAGNOSIS — G894 Chronic pain syndrome: Secondary | ICD-10-CM | POA: Insufficient documentation

## 2019-09-01 ENCOUNTER — Other Ambulatory Visit: Payer: Self-pay

## 2019-09-01 DIAGNOSIS — Z20822 Contact with and (suspected) exposure to covid-19: Secondary | ICD-10-CM

## 2019-09-03 LAB — NOVEL CORONAVIRUS, NAA: SARS-CoV-2, NAA: NOT DETECTED

## 2021-11-09 ENCOUNTER — Other Ambulatory Visit: Payer: Self-pay

## 2021-11-09 ENCOUNTER — Encounter: Payer: Self-pay | Admitting: Neurology

## 2021-11-09 ENCOUNTER — Telehealth: Payer: Self-pay | Admitting: Neurology

## 2021-11-09 ENCOUNTER — Ambulatory Visit: Payer: Medicare Other | Admitting: Neurology

## 2021-11-09 VITALS — BP 125/83 | HR 81 | Ht 68.5 in | Wt 189.5 lb

## 2021-11-09 DIAGNOSIS — H532 Diplopia: Secondary | ICD-10-CM | POA: Diagnosis not present

## 2021-11-09 DIAGNOSIS — H21562 Pupillary abnormality, left eye: Secondary | ICD-10-CM

## 2021-11-09 DIAGNOSIS — M1990 Unspecified osteoarthritis, unspecified site: Secondary | ICD-10-CM | POA: Insufficient documentation

## 2021-11-09 NOTE — Patient Instructions (Signed)
MRI brain with and without contrast MRI orbits with and without contrast BMP to look at kidney function Follow-up with Dr. Delman Cheadle as scheduled on Monday and please have them perform a fully dilated funduscopic exam to evaluate the optic nerves  Will contact you after completion of the MRIs for further instruction.  Return sooner if worse.

## 2021-11-09 NOTE — Telephone Encounter (Signed)
[employer] medicare no auth req. Sent Olin Hauser and [place] a message with GI to schedule him as soon as people.

## 2021-11-09 NOTE — Progress Notes (Signed)
[facility]  PATIENT: Justin Hurst DOB: [birth year]  REQUESTING CLINICIAN: Melissa Noon, OD HISTORY FROM: Patient and spouse  REASON FOR VISIT: Diplopia    HISTORICAL  CHIEF COMPLAINT:  Chief Complaint  Patient presents with   New Patient (Initial Visit)    Rm 12. NO PCP. Accompanied by spouse. NP/Paper/[facility]/Jason Delman Cheadle OD/diplopia, HA's r/o vascular event    HISTORY OF PRESENT ILLNESS:  This is a 66 year old gentleman past medical history of anxiety/depression, arthritis who is presenting for 1 week history of double vision.  Patient reported double vision was preceded by 1 week of headache, the last Thursday he woke up with double vision when looking at objects with both eyes.  When looking at objects with either left or right eye the vision is normal but with both eyes he does have double vision.  He also report that his vision is worsened with the left eye.  Denies any pain, denies any triggering factor, cannot think of anything that brought it on.  Patient denies any previous history of double vision, or loss of vision.  He denies any history of focal neurological deficit, denies any history of stroke, TIA or multiple sclerosis, no family history of multiple sclerosis.     OTHER MEDICAL CONDITIONS: Depression, Arthritis     REVIEW OF SYSTEMS: Full 14 system review of systems performed and negative with exception of: as noted in the HPI   ALLERGIES: No Known Allergies  HOME MEDICATIONS: Outpatient Medications Prior to Visit  Medication Sig Dispense Refill   ALPRAZolam (XANAX) 1 MG tablet Take 1 mg by mouth 2 (two) times daily.      sertraline (ZOLOFT) 100 MG tablet Take 100 mg by mouth every evening.      docusate sodium (COLACE) 100 MG capsule Take 1 capsule (100 mg total) by mouth 2 (two) times daily. 10 capsule 0   ferrous sulfate (FERROUSUL) 325 (65 FE) MG tablet Take 1 tablet (325 mg total) by mouth 3 (three) times daily with meals.   3   HYDROcodone-acetaminophen (NORCO) 7.5-325 MG tablet Take 1-2 tablets by mouth every 4 (four) hours as needed for moderate pain. 60 tablet 0   methocarbamol (ROBAXIN) 500 MG tablet Take 1 tablet (500 mg total) by mouth every 6 (six) hours as needed for muscle spasms. 40 tablet 0   polyethylene glycol (MIRALAX / GLYCOLAX) packet Take 17 g by mouth 2 (two) times daily. 14 each 0   No facility-administered medications prior to visit.    PAST MEDICAL HISTORY: Past Medical History:  Diagnosis Date   Anxiety    Arthritis    Complication of anesthesia    caused gas    PAST SURGICAL HISTORY: Past Surgical History:  Procedure Laterality Date   ankle fusions     bil at [place]     revision Dr. Alvan Dame Left Knee 03-09-18    KNEE ARTHROPLASTY     x2    KNEE ARTHROSCOPY     Left x3   ORIF FEMUR FRACTURE Left 03/09/2018   Procedure: Left knee femoral revision with open reduction internal fixation of distal femur periprosthetic fracture;  Surgeon: Paralee Cancel, MD;  Location: [facility];  Service: Orthopedics;  Laterality: Left;  Adductor Block   SHOULDER ARTHROSCOPY     Left    FAMILY HISTORY: History reviewed. No pertinent family history.  SOCIAL HISTORY: Social History   Socioeconomic History   Marital status: Married  Spouse name: Not on file   Number of children: Not on file   Years of education: Not on file   Highest education level: Not on file  Occupational History   Not on file  Tobacco Use   Smoking status: Never   Smokeless tobacco: Never  Vaping Use   Vaping Use: Never used  Substance and Sexual Activity   Alcohol use: Yes    Comment: 1-2 day   Drug use: No   Sexual activity: Yes  Other Topics Concern   Not on file  Social History Narrative   Not on file   Social Determinants of Health   Financial Resource Strain: Not on file  Food Insecurity: Not on file  Transportation Needs: Not on file  Physical Activity: Not on  file  Stress: Not on file  Social Connections: Not on file  Intimate Partner Violence: Not on file     PHYSICAL EXAM  GENERAL EXAM/CONSTITUTIONAL: Vitals:  Vitals:   11/09/21 0900  BP: 125/83  Pulse: 81  Weight: 189 lb 8 oz (86 kg)  Height: 5' 8.5" (1.74 m)   Body mass index is 28.39 kg/m. Wt Readings from Last 3 Encounters:  11/09/21 189 lb 8 oz (86 kg)  03/09/18 175 lb (79.4 kg)  03/03/18 175 lb (79.4 kg)   Patient is in no distress; well developed, nourished and groomed; neck is supple  CARDIOVASCULAR: Examination of carotid arteries is normal; no carotid bruits Regular rate and rhythm, no murmurs Examination of peripheral vascular system by observation and palpation is normal  EYES: Pupils round and reactive to light, Visual fields full to confrontation, Extraocular movements intacts,  Visual acuity test With glasses left eye 20/200,  right eye 20/100 Without glasses left eye 20/200, right eye 20/40  MUSCULOSKELETAL: Gait, strength, tone, movements noted in Neurologic exam below  NEUROLOGIC: MENTAL STATUS:  No flowsheet data found. awake, alert, oriented to person, place and time recent and remote memory intact normal attention and concentration language fluent, comprehension intact, naming intact fund of knowledge appropriate  CRANIAL NERVE:  2nd -funduscopy attempted but unable to complete due to patient constant blinking. 2nd, 3rd, 4th, 6th - pupils equal and reactive to light, visual fields full to confrontation, extraocular muscles intact, no nystagmus 5th - facial sensation symmetric 7th - facial strength symmetric 8th - hearing intact 9th - palate elevates symmetrically, uvula midline 11th - shoulder shrug symmetric 12th - tongue protrusion midline  MOTOR:  normal bulk and tone, full strength in the BUE, BLE  SENSORY:  normal and symmetric to light touch, pinprick, temperature, vibration  COORDINATION:  finger-nose-finger, fine finger  movements normal  REFLEXES:  deep tendon reflexes present and symmetric  GAIT/STATION:  normal     DIAGNOSTIC DATA (LABS, IMAGING, TESTING) - I reviewed patient records, labs, notes, testing and imaging myself where available.  Lab Results  Component Value Date   WBC 9.4 03/10/2018   HGB 10.6 (L) 03/10/2018   HCT 32.0 (L) 03/10/2018   MCV 93.3 03/10/2018   PLT 223 03/10/2018      Component Value Date/Time   NA 140 03/10/2018 0551   K 3.9 03/10/2018 0551   CL 109 03/10/2018 0551   CO2 25 03/10/2018 0551   GLUCOSE 140 (H) 03/10/2018 0551   BUN 15 03/10/2018 0551   CREATININE 0.80 03/10/2018 0551   CALCIUM 8.1 (L) 03/10/2018 0551   GFRNONAA >60 03/10/2018 0551   GFRAA >60 03/10/2018 0551   No results found for: CHOL, HDL,  LDLCALC, LDLDIRECT, TRIG, CHOLHDL No results found for: HGBA1C No results found for: VITAMINB12 No results found for: TSH   ASSESSMENT AND PLAN  66 y.o. year old male with past medical history of anxiety/depression, arthritis who is presenting with 1 week history of double vision and worsening vision with the left eye.  Patient reports when closing either eye his vision is back to normal, no double vision.  On exam, his visual acuity test is worse on the left 20/200 with and without glasses, he does have a afferent pupillary defect on the left.  I do suspect optic neuritis on the left, will obtain a MRI orbits with and without contrast but with his history of vascular disease I will also also obtain a brain MRI with and without contrast urgently.  He does have an appointment with Dr. Delman Cheadle, ophthalmology on Monday I advised him to complete a dilated funduscopic exam to evaluate the optic nerves.  I also ask them to have Dr. Delman Cheadle fax [place] the result of the evaluation/exam.  I will contact the patient after completion of the MRIs for further instructions. Return sooner if worse.   1. 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 Delman Cheadle as scheduled on Monday and please have them perform a fully dilated funduscopic exam to evaluate the optic nerves  Will contact you after completion of the MRIs for further instruction.  Return sooner if worse.  Orders Placed This Encounter  Procedures   MR BRAIN W WO CONTRAST   MR ORBITS W WO CONTRAST   Basic Metabolic Panel    No orders of the defined types were placed in this encounter.   Return if symptoms worsen or fail to improve.    Alric Ran, MD 11/09/2021, 9:42 AM  [facility] [address], [place], [place] [address] [phone]

## 2021-11-10 LAB — BASIC METABOLIC PANEL
BUN/Creatinine Ratio: 14 (ref 10–24)
BUN: 16 mg/dL (ref 8–27)
CO2: 24 mmol/L (ref 20–29)
Calcium: 10.2 mg/dL (ref 8.6–10.2)
Chloride: 103 mmol/L (ref 96–106)
Creatinine, Ser: 1.13 mg/dL (ref 0.76–1.27)
Glucose: 89 mg/dL (ref 70–99)
Potassium: 4.6 mmol/L (ref 3.5–5.2)
Sodium: 142 mmol/L (ref 134–144)
eGFR: 72 mL/min/{1.73_m2} (ref >59)

## 2021-11-12 NOTE — Telephone Encounter (Signed)
On Friday 11/09/21 GI left a voicemail.  11/09/21 1st attempt [facility]

## 2021-11-14 ENCOUNTER — Other Ambulatory Visit: Payer: Self-pay

## 2021-11-14 ENCOUNTER — Ambulatory Visit
Admission: RE | Admit: 2021-11-14 | Discharge: 2021-11-14 | Disposition: A | Discharge status: Home / Self Care | Payer: Medicare Other | Source: Ambulatory Visit | Attending: Neurology | Admitting: Neurology

## 2021-11-14 ENCOUNTER — Other Ambulatory Visit: Payer: PRIVATE HEALTH INSURANCE

## 2021-11-14 ENCOUNTER — Ambulatory Visit
Admission: RE | Admit: 2021-11-14 | Discharge: 2021-11-14 | Disposition: A | Discharge status: Home / Self Care | Payer: PRIVATE HEALTH INSURANCE | Source: Ambulatory Visit | Attending: Neurology | Admitting: Neurology

## 2021-11-14 DIAGNOSIS — H21562 Pupillary abnormality, left eye: Secondary | ICD-10-CM

## 2021-11-14 DIAGNOSIS — H532 Diplopia: Secondary | ICD-10-CM

## 2021-11-14 MED ORDER — GADOBENATE DIMEGLUMINE 529 MG/ML IV SOLN
17.0000 mL | Freq: Once | INTRAVENOUS | Status: AC | PRN
  Administered 2021-11-14: 17 mL via INTRAVENOUS

## 2021-11-22 ENCOUNTER — Ambulatory Visit (INDEPENDENT_AMBULATORY_CARE_PROVIDER_SITE_OTHER): Payer: Medicare Other | Admitting: Neurology

## 2021-11-22 ENCOUNTER — Encounter: Payer: Self-pay | Admitting: Neurology

## 2021-11-22 VITALS — BP 137/80 | HR 78 | Ht 68.0 in | Wt 180.0 lb

## 2021-11-22 DIAGNOSIS — H532 Diplopia: Secondary | ICD-10-CM

## 2021-11-22 DIAGNOSIS — C410 Malignant neoplasm of bones of skull and face: Secondary | ICD-10-CM | POA: Diagnosis not present

## 2021-11-22 DIAGNOSIS — R9389 Abnormal findings on diagnostic imaging of other specified body structures: Secondary | ICD-10-CM

## 2021-11-22 NOTE — Patient Instructions (Signed)
Follow-up with Dr. Zada Finders ?Follow-up MRI brain in 62-months ?Return to clinic in 3 months or sooner if worse ?

## 2021-11-22 NOTE — Progress Notes (Signed)
[facility]  PATIENT: Justin Hurst DOB: Dec 09, 1955  REQUESTING CLINICIAN: No ref. provider found HISTORY FROM: Patient and spouse  REASON FOR VISIT: Diplopia    HISTORICAL  CHIEF COMPLAINT:  Chief Complaint  Patient presents with   Follow-up    Rm 5, with wife, here for MRI results    INTERVAL HISTORY 11/22/2021 Patient presents today for follow-up for his recent MRI brain with and without contrast showing Heterogeneous 2.0 cm cystic lesion involving the clivus with prepontine extension, abutment of the basilar artery, and mass effect on the adjacent pons, as detailed above. No associated pontine edema, suggesting this may be a slow growing/chronic lesion. 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  He is accompanied by his wife.  We reviewed the images, all questions answered, I have explained to him that I will refer him to Dr. Zada Finders neurosurgery for further evaluation and possible treatment.  I will also obtain a follow-up MRI in 49-months. Her still complaining of double vision.    HISTORY OF PRESENT ILLNESS:  This is a 66 year old gentleman past medical history of anxiety/depression, arthritis who is presenting for 1 week history of double vision.  Patient reported double vision was preceded by 1 week of headache, the last Thursday he woke up with double vision when looking at objects with both eyes.  When looking at objects with either left or right eye the vision is normal but with both eyes he does have double vision.  He also report that his vision is worsened with the left eye.  Denies any pain, denies any triggering factor, cannot think of anything that brought it on.  Patient denies any previous history of double vision, or loss of vision.  He denies any history of focal neurological deficit,  denies any history of stroke, TIA or multiple sclerosis, no family history of multiple sclerosis.     OTHER MEDICAL CONDITIONS: Depression, Arthritis     REVIEW OF SYSTEMS: Full 14 system review of systems performed and negative with exception of: as noted in the HPI   ALLERGIES: No Known Allergies  HOME MEDICATIONS: Outpatient Medications Prior to Visit  Medication Sig Dispense Refill   ALPRAZolam (XANAX) 1 MG tablet Take 1 mg by mouth 2 (two) times daily.      sertraline (ZOLOFT) 100 MG tablet Take 100 mg by mouth every evening.      No facility-administered medications prior to visit.    PAST MEDICAL HISTORY: Past Medical History:  Diagnosis Date   Anxiety    Arthritis    Complication of anesthesia    caused gas    PAST SURGICAL HISTORY: Past Surgical History:  Procedure Laterality Date   ankle fusions     bil at [place]     revision Dr. Alvan Dame Left Knee 03-09-18    KNEE ARTHROPLASTY     x2    KNEE ARTHROSCOPY     Left x3   ORIF FEMUR FRACTURE Left 03/09/2018   Procedure: Left knee femoral revision with open reduction internal fixation of distal femur periprosthetic fracture;  Surgeon: Paralee Cancel, MD;  Location: [facility];  Service: Orthopedics;  Laterality: Left;  Adductor Block   SHOULDER ARTHROSCOPY     Left    FAMILY HISTORY: History reviewed. No pertinent family history.  SOCIAL HISTORY: Social History  Socioeconomic History   Marital status: Married    Spouse name: Not on file   Number of children: Not on file   Years of education: Not on file   Highest education level: Not on file  Occupational History   Not on file  Tobacco Use   Smoking status: Never   Smokeless tobacco: Never  Vaping Use   Vaping Use: Never used  Substance and Sexual Activity   Alcohol use: Yes    Comment: 1-2 day   Drug use: No   Sexual activity: Yes  Other Topics Concern   Not on file  Social History Narrative   Not on file    Social Determinants of Health   Financial Resource Strain: Not on file  Food Insecurity: Not on file  Transportation Needs: Not on file  Physical Activity: Not on file  Stress: Not on file  Social Connections: Not on file  Intimate Partner Violence: Not on file     PHYSICAL EXAM  GENERAL EXAM/CONSTITUTIONAL: Vitals:  Vitals:   11/22/21 1554  BP: 137/80  Pulse: 78  Weight: 180 lb (81.6 kg)  Height: 5\' 8"  (1.727 m)   Body mass index is 27.37 kg/m. Wt Readings from Last 3 Encounters:  11/22/21 180 lb (81.6 kg)  11/09/21 189 lb 8 oz (86 kg)  03/09/18 175 lb (79.4 kg)   Patient is in no distress; well developed, nourished and groomed; neck is supple  CARDIOVASCULAR: Examination of carotid arteries is normal; no carotid bruits Regular rate and rhythm, no murmurs Examination of peripheral vascular system by observation and palpation is normal  EYES: Pupils round and reactive to light, Visual fields full to confrontation, Extraocular movements intacts,   MUSCULOSKELETAL: Gait, strength, tone, movements noted in Neurologic exam below  NEUROLOGIC: MENTAL STATUS:  No flowsheet data found. awake, alert, oriented to person, place and time recent and remote memory intact normal attention and concentration language fluent, comprehension intact, naming intact fund of knowledge appropriate  CRANIAL NERVE:  2nd, 3rd, 4th, 6th - pupils equal and reactive to light, visual fields full to confrontation, extraocular muscles intact, no nystagmus 5th - facial sensation symmetric 7th - facial strength symmetric 8th - hearing intact 9th - palate elevates symmetrically, uvula midline 11th - shoulder shrug symmetric 12th - tongue protrusion midline  MOTOR:  normal bulk and tone, full strength in the BUE, BLE  SENSORY:  normal and symmetric to light touch, pinprick, temperature, vibration  COORDINATION:  finger-nose-finger, fine finger movements normal  REFLEXES:  deep  tendon reflexes present and symmetric  GAIT/STATION:  normal     DIAGNOSTIC DATA (LABS, IMAGING, TESTING) - I reviewed patient records, labs, notes, testing and imaging myself where available.  Lab Results  Component Value Date   WBC 9.4 03/10/2018   HGB 10.6 (L) 03/10/2018   HCT 32.0 (L) 03/10/2018   MCV 93.3 03/10/2018   PLT 223 03/10/2018      Component Value Date/Time   NA 142 11/09/2021 0932   K 4.6 11/09/2021 0932   CL 103 11/09/2021 0932   CO2 24 11/09/2021 0932   GLUCOSE 89 11/09/2021 0932   GLUCOSE 140 (H) 03/10/2018 0551   BUN 16 11/09/2021 0932   CREATININE 1.13 11/09/2021 0932   CALCIUM 10.2 11/09/2021 0932   GFRNONAA >60 03/10/2018 0551   GFRAA >60 03/10/2018 0551   No results found for: CHOL, HDL, LDLCALC, LDLDIRECT, TRIG, CHOLHDL No results found for: HGBA1C No results found for: VITAMINB12 No results found for: TSH  MRI Brain with and without contrast  1. Heterogeneous 2.0 cm cystic lesion involving the clivus with prepontine extension, abutment of the basilar artery, and mass effect on the adjacent pons, as detailed above. No associated pontine edema, suggesting this may be a slow growing/chronic lesion. 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 Recommend close interval follow-up MRI. 2. Otherwise, no acute intracranial abnormality. Unremarkable appearance of the orbits. 3. Moderate scattered T2/FLAIR hyperintensities within the white matter, which are nonspecific but potentially related to chronic microvascular ischemic disease or chronic migraines given frontal predominance. Chronic demyelination is a less likely differential consideration. 4. Frontal predominant cerebral atrophy.   ASSESSMENT AND PLAN  66 y.o. 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  He still experiences double vision.  I will send him to neurosurgery, Dr. Zada Finders for further evaluation and possible treatment.  I will also obtain a follow-up MRI in 3 months.  This was discussed with the patient and he is comfortable with plans.  He understand to contact me and return to the clinic if his symptoms worsen.      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 Abnormal MRI   3. Double vision      Patient Instructions  Follow-up with Dr. Zada Finders Follow-up MRI brain in 82-months Return to clinic in 3 months or sooner if worse  Orders Placed This Encounter  Procedures   MR [facility]   Ambulatory referral to Neurosurgery    No orders of the defined types were placed in this encounter.   Return in about 3 months (around 02/22/2022).    Alric Ran, MD 11/22/2021, 5:00 PM  [facility] [address] [place], [place] [address] [phone]

## 2021-11-23 ENCOUNTER — Telehealth: Payer: Self-pay | Admitting: Neurology

## 2021-11-23 NOTE — Telephone Encounter (Signed)
Patient had an MRI on 11/14/21 are you already wanting him to repeat it?  ?

## 2021-11-23 NOTE — Telephone Encounter (Signed)
Yes, but it should be scheduled in 3 months prior to his appointment in June 8. Preferably the first week of June.  ? ?Thanks

## 2021-11-26 ENCOUNTER — Telehealth: Payer: Self-pay | Admitting: Neurology

## 2021-11-26 NOTE — Telephone Encounter (Signed)
Noted, order is sent to GI they will schedule the patient for June.  ?

## 2021-11-26 NOTE — Telephone Encounter (Signed)
Sent to [facility] ph # [phone]. 

## 2021-11-30 ENCOUNTER — Other Ambulatory Visit: Payer: Self-pay | Admitting: Radiation Therapy

## 2021-12-03 ENCOUNTER — Inpatient Hospital Stay: Payer: Medicare Other | Attending: Neurological Surgery

## 2022-02-14 ENCOUNTER — Other Ambulatory Visit: Payer: Self-pay | Admitting: Neurological Surgery

## 2022-02-14 DIAGNOSIS — D496 Neoplasm of unspecified behavior of brain: Secondary | ICD-10-CM

## 2022-02-22 ENCOUNTER — Other Ambulatory Visit: Payer: Medicare Other

## 2022-02-25 ENCOUNTER — Telehealth: Payer: Self-pay | Admitting: Neurology

## 2022-02-25 NOTE — Telephone Encounter (Signed)
Correction to information below. Patient will have his MRI on 03/03/22. I spoke to this wife and she rescheduled his follow up to 03/14/22.

## 2022-02-25 NOTE — Telephone Encounter (Signed)
Pt wife calling concerning pt appt. Would like to know since his MRI is now scheduled for 6/1  if they need to reschedule appt with [facility] for 6/8. Would like a call back with weather not to reschedule.

## 2022-02-28 ENCOUNTER — Ambulatory Visit: Payer: Medicare Other | Admitting: Neurology

## 2022-03-03 ENCOUNTER — Ambulatory Visit
Admission: RE | Admit: 2022-03-03 | Discharge: 2022-03-03 | Disposition: A | Discharge status: Home / Self Care | Payer: Medicare Other | Source: Ambulatory Visit | Attending: Neurological Surgery | Admitting: Neurological Surgery

## 2022-03-03 DIAGNOSIS — D496 Neoplasm of unspecified behavior of brain: Secondary | ICD-10-CM

## 2022-03-03 MED ORDER — GADOBENATE DIMEGLUMINE 529 MG/ML IV SOLN
17.0000 mL | Freq: Once | INTRAVENOUS | Status: AC | PRN
  Administered 2022-03-03: 17 mL via INTRAVENOUS

## 2022-03-14 ENCOUNTER — Ambulatory Visit: Payer: Medicare Other | Admitting: Neurology

## 2022-06-15 HISTORY — PX: TUMOR EXCISION: SHX421

## 2022-12-03 ENCOUNTER — Other Ambulatory Visit (HOSPITAL_COMMUNITY): Payer: Self-pay | Admitting: Student

## 2022-12-03 DIAGNOSIS — Z96652 Presence of left artificial knee joint: Secondary | ICD-10-CM

## 2022-12-13 ENCOUNTER — Encounter (HOSPITAL_COMMUNITY)
Admission: RE | Admit: 2022-12-13 | Discharge: 2022-12-13 | Disposition: A | Discharge status: Home / Self Care | Payer: Medicare Other | Source: Ambulatory Visit | Attending: Student | Admitting: Student

## 2022-12-13 DIAGNOSIS — Z96652 Presence of left artificial knee joint: Secondary | ICD-10-CM | POA: Insufficient documentation

## 2022-12-13 MED ORDER — TECHNETIUM TC 99M MEDRONATE IV KIT
22.0000 | PACK | Freq: Once | INTRAVENOUS | Status: AC | PRN
  Administered 2022-12-13: 22 via INTRAVENOUS

## 2023-01-27 IMAGING — MR MR HEAD WO/W CM
13 series · 48 of 48 positions shown · IV contrast (multihance)
Comparison: 11/14/2021

CLINICAL DATA: S RS treatment planning. Lesion found on prior exam.

EXAM:
MRI HEAD WITHOUT AND WITH CONTRAST
TECHNIQUE: Multiplanar, multiecho pulse sequences of the brain and surrounding
structures were obtained without and with intravenous contrast.
CONTRAST:  17mL MULTIHANCE GADOBENATE DIMEGLUMINE 529 MG/ML IV SOLN

[Series 2: FLAIR · sagittal · 3.0mm · 0.75mm/px · 1 of 39 slices shown (1 of 2)]
[im 1/39]
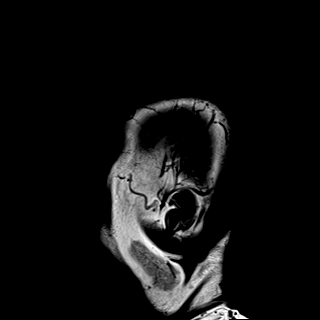

[Series 3: DWI · axial · 3.0mm · 1.50mm/px · z∈[-104,+60]mm · 3 of 86 slices shown (1 of 2)]
[im 1/86]
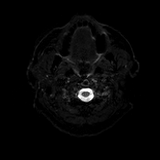
[im 43/86]
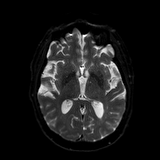
[im 86/86]
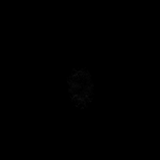

[Series 4: DWI · axial · 3.0mm · 1.50mm/px · z∈[-104,+60]mm · 2 of 43 slices shown (2 of 2)]
[im 1/43]
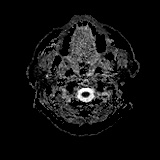
[im 43/43]
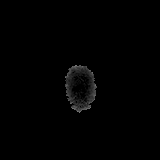

[Series 5: T2 · axial · 5.0mm · 0.57mm/px · 1 of 28 slices shown (1 of 2)]
[im 1/28]
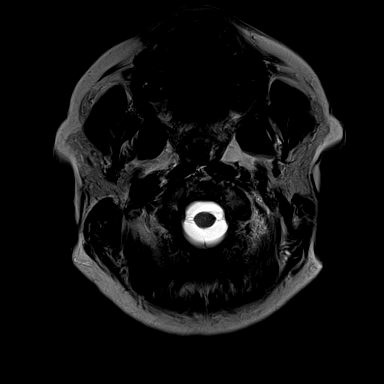

[Series 6: mip_images(sw) · axial · 12.0mm · 0.90mm/px · z∈[-98,+58]mm · 4 of 105 slices shown]
[im 1/105]
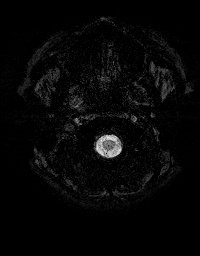
[im 35/105]
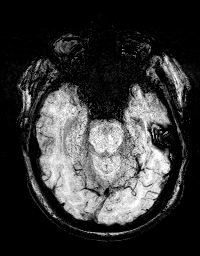
[im 70/105]
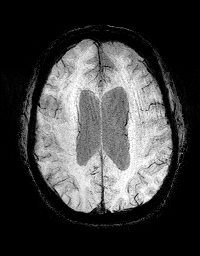
[im 105/105]
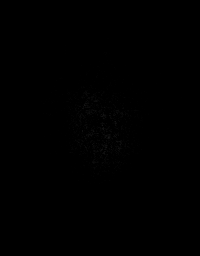

[Series 7: swi_images · axial · 1.5mm · 0.90mm/px · z∈[-104,+63]mm · 5 of 112 slices shown]
[im 1/112]
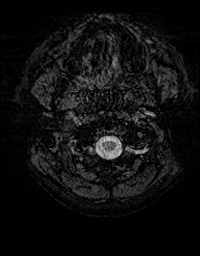
[im 28/112]
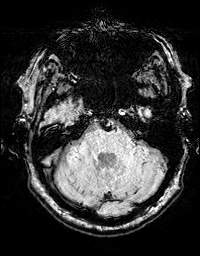
[im 56/112]
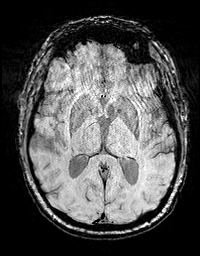
[im 84/112]
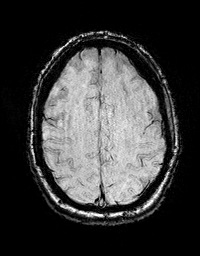
[im 112/112]
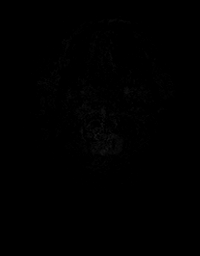

[Series 8: FLAIR · axial · 3.0mm · 0.86mm/px · z∈[-125,+67]mm · 3 of 64 slices shown (2 of 2)]
[im 1/64]
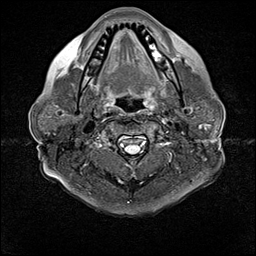
[im 32/64]
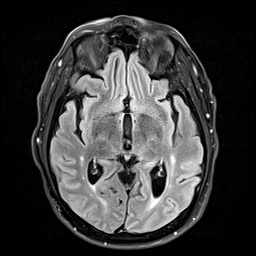
[im 64/64]
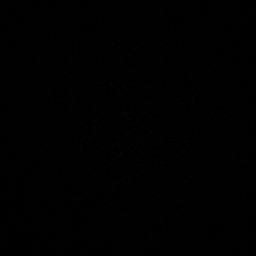

[Series 9: T2 · axial · non-contrast · 1.0mm · 0.86mm/px · z∈[-104,+67]mm · 8 of 176 slices shown (2 of 2)]
[im 1/176]
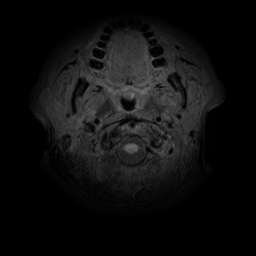
[im 26/176]
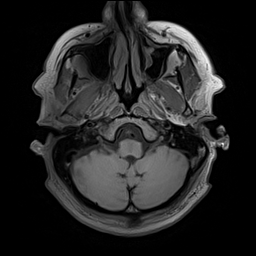
[im 51/176]
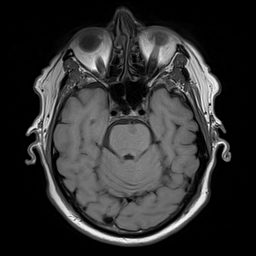
[im 76/176]
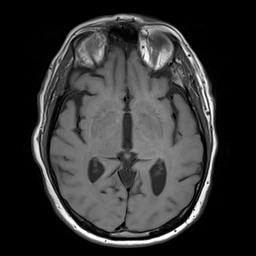
[im 101/176]
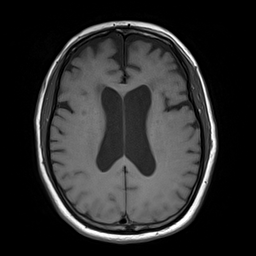
[im 126/176]
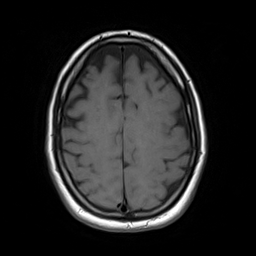
[im 151/176]
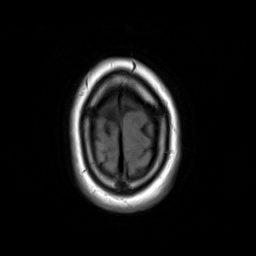
[im 176/176]
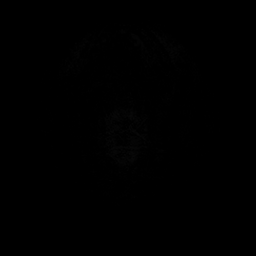

[Series 10: T2 post-contrast · coronal · 3.0mm · 0.57mm/px · 2 of 49 slices shown (1 of 2)]
[im 1/49]
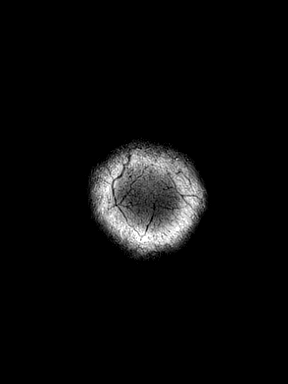
[im 49/49]
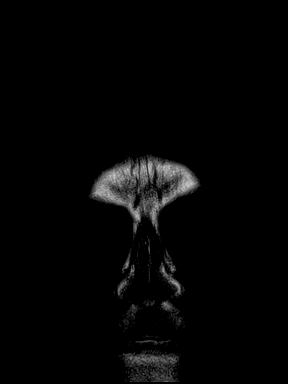

[Series 11: T2 post-contrast · axial · 1.0mm · 0.86mm/px · z∈[-104,+67]mm · 8 of 176 slices shown (2 of 2)]
[im 1/176]
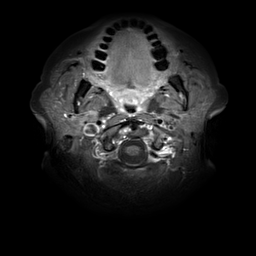
[im 26/176]
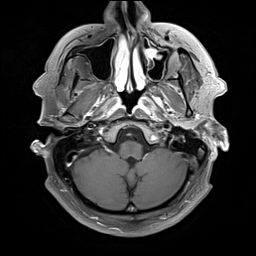
[im 51/176]
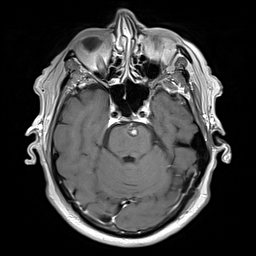
[im 76/176]
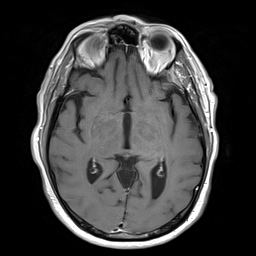
[im 101/176]
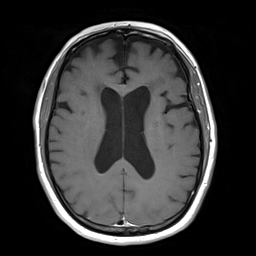
[im 126/176]
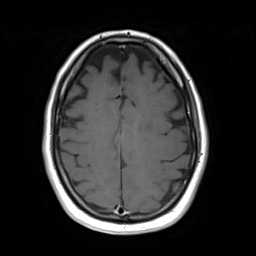
[im 151/176]
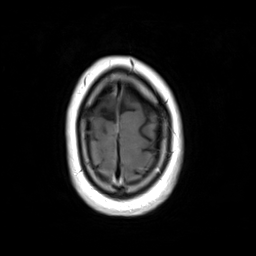
[im 176/176]
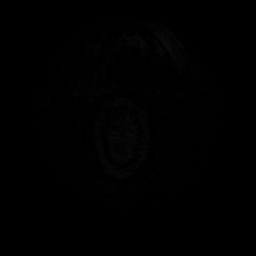

[Series 12: T1 post-contrast · axial · 1.0mm · 0.75mm/px · z∈[-98,+61]mm · 7 of 160 slices shown (1 of 2)]
[im 1/160]
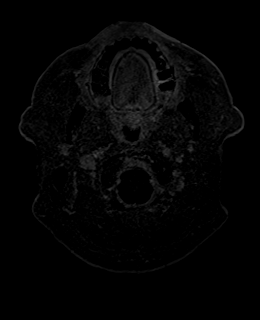
[im 27/160]
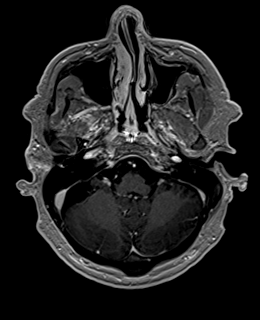
[im 54/160]
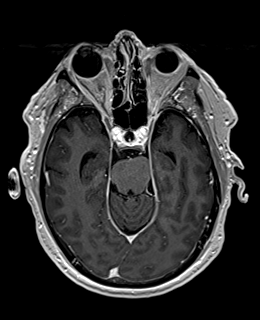
[im 80/160]
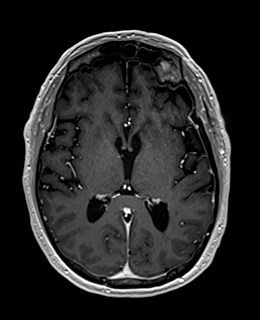
[im 107/160]
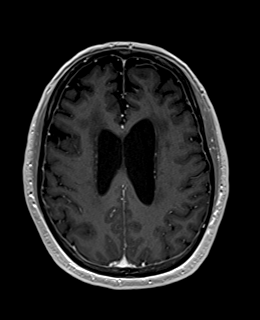
[im 133/160]
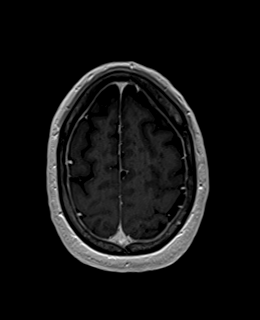
[im 160/160]
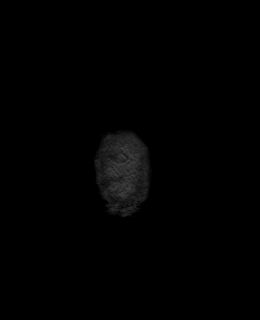

[Series 13: T1 post-contrast · coronal · 3.0mm · 0.57mm/px · 2 of 49 slices shown (2 of 2)]
[im 1/49]
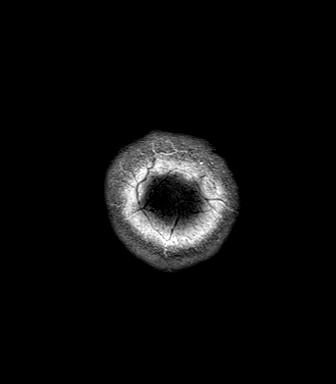
[im 49/49]
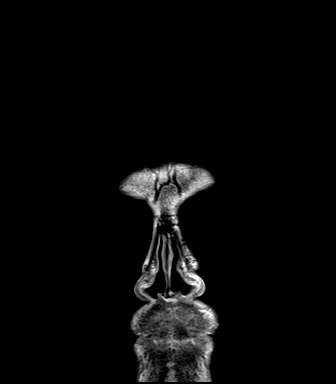

[Series 14: FLAIR post-contrast · sagittal · 3.0mm · 0.75mm/px · 2 of 39 slices shown]
[im 1/39]
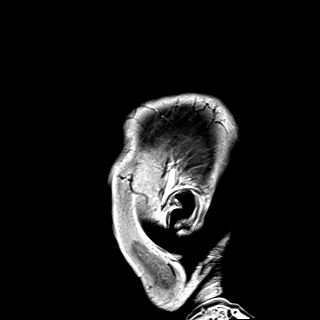
[im 39/39]
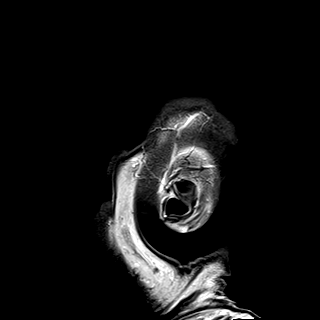

[48 of 48 positions shown; findings below may reference images not displayed]

FINDINGS: Brain: No visible change in a 2.3 x 1.7 x 1.8 cm mass arising from
the dorsal clivus with extension into the perimesencephalic cistern,
partial encasement of the basilar artery and indentation of the
ventral pons. Heterogeneous nature with some contrast enhancement.
The differential diagnosis remains that of ecchordosis physaliphora
versus chordoma.

Otherwise the brain appears the same with chronic small-vessel
ischemic changes of the left thalamus and cerebral hemispheric white
matter without evidence of large vessel infarction. No evidence of
intra-axial mass lesion, hemorrhage, hydrocephalus or extra-axial
fluid collection.

Vascular: Major vessels at the base of the brain show flow.

Skull and upper cervical spine: Otherwise negative

Sinuses/Orbits: Clear other than mild mucosal thickening of the left
maxillary sinus. Orbits negative.

Other: None
IMPRESSION: No change since the study of 11/14/2021. Proximally 2.3 x 1.7 x
cm mass arising from the dorsal clivus with extension into the
perimesencephalic cistern, partial encasement of the basilar artery
and indentation of the ventral pons. The differential diagnosis
remains that of ecchordosis physaliphora versus chordoma.

## 2023-04-05 ENCOUNTER — Emergency Department (HOSPITAL_BASED_OUTPATIENT_CLINIC_OR_DEPARTMENT_OTHER): Payer: Medicare Other

## 2023-04-05 ENCOUNTER — Emergency Department (HOSPITAL_BASED_OUTPATIENT_CLINIC_OR_DEPARTMENT_OTHER)
Admission: EM | Admit: 2023-04-05 | Discharge: 2023-04-05 | Disposition: A | Discharge status: Home / Self Care | Payer: Medicare Other | Attending: Emergency Medicine | Admitting: Emergency Medicine

## 2023-04-05 ENCOUNTER — Other Ambulatory Visit: Payer: Self-pay

## 2023-04-05 DIAGNOSIS — M25561 Pain in right knee: Secondary | ICD-10-CM | POA: Insufficient documentation

## 2023-04-05 DIAGNOSIS — M25562 Pain in left knee: Secondary | ICD-10-CM | POA: Insufficient documentation

## 2023-04-05 DIAGNOSIS — S0990XA Unspecified injury of head, initial encounter: Secondary | ICD-10-CM | POA: Insufficient documentation

## 2023-04-05 DIAGNOSIS — W108XXA Fall (on) (from) other stairs and steps, initial encounter: Secondary | ICD-10-CM | POA: Insufficient documentation

## 2023-04-05 DIAGNOSIS — M542 Cervicalgia: Secondary | ICD-10-CM | POA: Insufficient documentation

## 2023-04-05 DIAGNOSIS — W19XXXA Unspecified fall, initial encounter: Secondary | ICD-10-CM

## 2023-04-05 MED ORDER — LIDOCAINE 5 % EX PTCH: 1.0000 | MEDICATED_PATCH | CUTANEOUS | 0 refills | Status: DC

## 2023-04-05 MED ORDER — METHOCARBAMOL 500 MG PO TABS: 500.0000 mg | ORAL_TABLET | Freq: Two times a day (BID) | ORAL | 0 refills | Status: DC

## 2023-04-05 NOTE — ED Triage Notes (Signed)
Baseline unsteady on feet- needs to use walker but does not- HX several knee surg. Larey Seat backwards down a dozen steps. Pain in neck- collar applied. No tenderness in T or L spine. Pelvis stable. No pain in ROM with legs or arms. Right knee has abrasion, left pinky has small lac-bandaid applied. No thinners, no LOC.

## 2023-04-05 NOTE — ED Provider Notes (Signed)
[place] EMERGENCY DEPARTMENT AT [facility] Provider Note   CSN: [id number] Arrival date & time: 04/05/23  1558     History  Chief Complaint  Patient presents with   Fall    Down stairs    Justin Hurst is a 67 y.o. male.  Patient with history of osteoarthritis presents today with complaints of fall.  He states that same occurred immediately prior to arrival today when he was at the top of the steps and lost his balance and fell backwards down approximately 12 steps.  He did hit his head but did not lose consciousness.  He was able to get up off the ground and walk around with a walker which is his baseline.  He is endorsing neck pain.  Denies any sharp shooting pain down his extremities or numbness/tingling.  No loss of bowel or bladder function or saddle paresthesias.  Denies any vision changes.  No chest pain, shortness of breath, nausea, vomiting.  Does have some soreness in his bilateral knees, states he has had several surgeries to both of these and is concerned that his hardware could be displaced.  He is not anticoagulated.  The history is provided by the patient. No language interpreter was used.  Fall       Home Medications Prior to Admission medications   Medication Sig Start Date End Date Taking? Authorizing Provider  ALPRAZolam Prudy Feeler) 1 MG tablet Take 1 mg by mouth 2 (two) times daily.     [provider]  sertraline (ZOLOFT) 100 MG tablet Take 100 mg by mouth every evening.     [provider]      Allergies    Patient has no known allergies.    Review of Systems   Review of Systems  Musculoskeletal:  Positive for arthralgias, myalgias and neck pain.  All other systems reviewed and are negative.   Physical Exam Updated Vital Signs BP 122/81   Pulse 68   Temp 98.8 F (37.1 C)   Resp 16   SpO2 92%  Physical Exam Vitals and nursing note reviewed.  Constitutional:      General: He is not in acute distress.     Appearance: Normal appearance. He is normal weight. He is not ill-appearing, toxic-appearing or diaphoretic.  HENT:     Head: Normocephalic and atraumatic.     Comments: No racoon eyes No battle sign Eyes:     Extraocular Movements: Extraocular movements intact.     Pupils: Pupils are equal, round, and reactive to light.  Neck:     Comments: C collar in place Cardiovascular:     Rate and Rhythm: Normal rate and regular rhythm.     Heart sounds: Normal heart sounds.  Pulmonary:     Effort: Pulmonary effort is normal. No respiratory distress.     Breath sounds: Normal breath sounds.  Chest:     Comments: No tenderness to palpation of the anterior chest wall Abdominal:     General: Abdomen is flat.     Palpations: Abdomen is soft.     Tenderness: There is no abdominal tenderness.  Musculoskeletal:        General: Normal range of motion.     Cervical back: Normal and normal range of motion.     Thoracic back: Normal.     Lumbar back: Normal.     Comments: No tenderness to palpation of thoracic or lumbar spine.  No step-offs, lesions, deformity, or overlying skin changes.  Mild TTP  of the bilateral knees.  Well-healed surgical scar present to the anterior left knee.  ROM intact with some discomfort.  DP and PT pulses intact and 2+ bilaterally.  No wounds or overlying skin changes.  Skin:    General: Skin is warm and dry.  Neurological:     General: No focal deficit present.     Mental Status: He is alert.  Psychiatric:        Mood and Affect: Mood normal.        Behavior: Behavior normal.     ED Results / Procedures / Treatments   Labs (all labs ordered are listed, but only abnormal results are displayed) Labs Reviewed - No data to display  EKG None  Radiology CT Cervical Spine Wo Contrast  Result Date: 04/05/2023 CLINICAL DATA:  Neck trauma. EXAM: CT CERVICAL SPINE WITHOUT CONTRAST TECHNIQUE: Multidetector CT imaging of the cervical spine was performed without  intravenous contrast. Multiplanar CT image reconstructions were also generated. RADIATION DOSE REDUCTION: This exam was performed according to the departmental dose-optimization program which includes automated exposure control, adjustment of the mA and/or kV according to patient size and/or use of iterative reconstruction technique. COMPARISON:  None Available. FINDINGS: Alignment: Grade 1 anterolisthesis of C3 on C4, likely degenerative. Skull base and vertebrae: No acute fracture. No primary bone lesion or focal pathologic process. Degenerative remodeling of multiple vertebral bodies. Soft tissues and spinal canal: No prevertebral fluid or swelling. No visible canal hematoma. Disc levels: Multilevel osteoarthritic changes with disc space loss, endplate sclerosis and remodeling of vertebral bodies. Associated posterior facet arthropathy. Upper chest: Negative. Other: None. IMPRESSION: 1. No acute fracture or subluxation of the cervical spine. 2. Grade 1 anterolisthesis of C3 on C4, likely degenerative. 3. Multilevel osteoarthritic changes of the cervical spine. Electronically Signed   By: Ted Mcalpine M.D.   On: 04/05/2023 18:06   CT Head Wo Contrast  Result Date: 04/05/2023 CLINICAL DATA:  Head trauma. EXAM: CT HEAD WITHOUT CONTRAST TECHNIQUE: Contiguous axial images were obtained from the base of the skull through the vertex without intravenous contrast. RADIATION DOSE REDUCTION: This exam was performed according to the departmental dose-optimization program which includes automated exposure control, adjustment of the mA and/or kV according to patient size and/or use of iterative reconstruction technique. COMPARISON:  Head MRI March 03, 2022 FINDINGS: Brain: No evidence of acute infarction, hemorrhage, hydrocephalus, extra-axial collection or mass lesion/mass effect. Moderate brain parenchymal volume loss and deep white matter microangiopathy. Vascular: No hyperdense vessel or unexpected calcification.  Skull: Normal. Negative for fracture or focal lesion. Sinuses/Orbits: Polypoid mucosal thickening of the maxillary sinuses. Other: None. IMPRESSION: 1. No acute intracranial abnormality. 2. Moderate brain parenchymal volume loss and deep white matter microangiopathy. 3. Polypoid mucosal thickening of the maxillary sinuses. Electronically Signed   By: Ted Mcalpine M.D.   On: 04/05/2023 18:04   DG Knee Complete 4 Views Right  Result Date: 04/05/2023 CLINICAL DATA:  Fall down stairs EXAM: RIGHT KNEE - COMPLETE 4+ VIEW COMPARISON:  None Available. FINDINGS: Severe medial compartmental articular space narrowing with tricompartmental spurring. Indeterminate for knee effusion to see the degree of flexion utilized on the lateral projection. Probably chronically fragmented spur medially along the medial femoral condyle. Spurring along the intercondylar notch and tibial spine. Possible meniscal chondrocalcinosis. I do not see a discrete fracture. IMPRESSION: 1. No acute fracture is identified. 2. Tricompartmental osteoarthritis, with severe medial compartmental articular space narrowing. 3. Possible meniscal chondrocalcinosis. Electronically Signed   By: Gaylyn Rong  M.D.   On: 04/05/2023 17:57   DG Knee Complete 4 Views Left  Result Date: 04/05/2023 CLINICAL DATA:  Fall down stairs EXAM: LEFT KNEE - COMPLETE 4+ VIEW COMPARISON:  None Available. FINDINGS: Total knee prosthesis with long stem femoral component and cerclage wires along the distal femoral metadiaphysis, with thinning of overlying bone especially along the medial and anterior portion of the stem, probably chronic. Elongated spacer with post centrally in the knee. Seem intended tibial tray component with substantial lucency below the tibial tray and proximal posts especially in zones 1 and 2 but also in zones 3 in for on the frontal projection. This may well be a chronic condition but no prior radiographs are available for comparison. Old  deformity of the proximal fibular from healed fracture. Flattened and somewhat diminutive patella. I do not see a definite acute fracture. IMPRESSION: 1. No definite acute fracture. 2. Total knee prosthesis with long stem femoral component and cerclage wires along the distal femoral metadiaphysis, with thinning of overlying bone especially along the medial and anterior portion of the stem, probably chronic. 3. Substantial lucency below the tibial tray and proximal posts especially in zones 1 and 2 but also in zones 3 and 4 on the frontal projection. This may well be a chronic condition but no prior radiographs are available for comparison. 4. Old deformity of the proximal fibula from healed fracture. Electronically Signed   By: Gaylyn Rong M.D.   On: 04/05/2023 17:55   DG Chest Portable 1 View  Result Date: 04/05/2023 CLINICAL DATA:  Fall backwards down stairs EXAM: PORTABLE CHEST 1 VIEW COMPARISON:  None Available. FINDINGS: Degenerative glenohumeral arthropathy, right greater than left. Multiple bilateral rib deformities compatible with healed fractures. Atherosclerotic calcification of the aortic arch. Heart size within normal limits for projection. The lungs appear clear.  No blunting of the costophrenic angles. IMPRESSION: 1. No acute findings. 2. Multiple bilateral rib deformities compatible with healed rib fractures. 3. Degenerative glenohumeral arthropathy, right greater than left. Electronically Signed   By: Gaylyn Rong M.D.   On: 04/05/2023 17:52    Procedures Procedures    Medications Ordered in ED Medications - No data to display  ED Course/ Medical Decision Making/ A&P                             Medical Decision Making Amount and/or Complexity of Data Reviewed Radiology: ordered.   This patient is a 67 y.o. male who presents to the ED for concern of mechanical fall, this involves an extensive number of treatment options, and is a complaint that carries with it a high  risk of complications and morbidity. The emergent differential diagnosis prior to evaluation includes, but is not limited to,  trauma . This is not an exhaustive differential.   Past Medical History / Co-morbidities / Social History: history of osteoarthritis  Physical Exam: Physical exam performed. The pertinent findings include: in c collar, no obvious deformity. Areas of tenderness per above   Imaging Studies: I ordered imaging studies including CT head, cervical spine. DG chest and bilateral knees. I independently visualized and interpreted imaging which showed   CT head: 1. No acute intracranial abnormality. 2. Moderate brain parenchymal volume loss and deep white matter microangiopathy. 3. Polypoid mucosal thickening of the maxillary sinuses.  Findings consistent with status post brain surgery  CT cervical spine:  1. No acute fracture or subluxation of the cervical spine. 2. Grade 1  anterolisthesis of C3 on C4, likely degenerative. 3. Multilevel osteoarthritic changes of the cervical spine.  DG right knee:  1. No acute fracture is identified. 2. Tricompartmental osteoarthritis, with severe medial compartmental articular space narrowing. 3. Possible meniscal chondrocalcinosis.  DG left knee: 1. No definite acute fracture. 2. Total knee prosthesis with long stem femoral component and cerclage wires along the distal femoral metadiaphysis, with thinning of overlying bone especially along the medial and anterior portion of the stem, probably chronic. 3. Substantial lucency below the tibial tray and proximal posts especially in zones 1 and 2 but also in zones 3 and 4 on the frontal projection. This may well be a chronic condition but no prior radiographs are available for comparison. 4. Old deformity of the proximal fibula from healed fracture.  DG chest:  1. No acute findings. 2. Multiple bilateral rib deformities compatible with healed rib fractures. 3. Degenerative  glenohumeral arthropathy, right greater than left.  I agree with the radiologist interpretation.   Disposition: After consideration of the diagnostic results and the patients response to treatment, I feel that emergency department workup does not suggest an emergent condition requiring admission or immediate intervention beyond what has been performed at this time. The plan is: Discharge with close follow-up.  Patient's imaging is benign.  He is able to get up and walk around with a walker which is his baseline and feels ready to go home.  He does have a history of narcotic dependence and therefore does not want any of this medication.  I we will give him Robaxin for his neck soreness.  I have advised him not to drive or operate heavy machinery while taking this medication.  Will also give lidocaine patches.  Recommend close outpatient follow-up. Evaluation and diagnostic testing in the emergency department does not suggest an emergent condition requiring admission or immediate intervention beyond what has been performed at this time.  Plan for discharge with close PCP follow-up.  Patient is understanding and amenable with plan, educated on red flag symptoms that would prompt immediate return.  Patient discharged in stable condition.  Final Clinical Impression(s) / ED Diagnoses Final diagnoses:  Fall, initial encounter    Rx / DC Orders ED Discharge Orders          Ordered    methocarbamol (ROBAXIN) 500 MG tablet  2 times daily        04/05/23 2009    lidocaine (LIDODERM) 5 %  Every 24 hours        04/05/23 2009          An After Visit Summary was printed and given to the patient.     Vear Clock 04/05/23 2010    Tegeler, Canary Brim, MD 04/05/23 2237
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
## 2023-06-25 ENCOUNTER — Inpatient Hospital Stay (HOSPITAL_COMMUNITY): Payer: Medicare Other

## 2023-06-25 ENCOUNTER — Other Ambulatory Visit: Payer: Self-pay

## 2023-06-25 ENCOUNTER — Inpatient Hospital Stay (HOSPITAL_COMMUNITY)
Admission: EM | Admit: 2023-06-25 | Discharge: 2023-07-01 | DRG: 481 | Disposition: A | Discharge status: Skilled Nursing Facility | Payer: Medicare Other | Attending: Internal Medicine | Admitting: Internal Medicine

## 2023-06-25 ENCOUNTER — Encounter (HOSPITAL_COMMUNITY): Payer: Self-pay

## 2023-06-25 ENCOUNTER — Emergency Department (HOSPITAL_COMMUNITY): Payer: Medicare Other

## 2023-06-25 DIAGNOSIS — D72829 Elevated white blood cell count, unspecified: Secondary | ICD-10-CM | POA: Diagnosis present

## 2023-06-25 DIAGNOSIS — Z96642 Presence of left artificial hip joint: Secondary | ICD-10-CM | POA: Diagnosis present

## 2023-06-25 DIAGNOSIS — R21 Rash and other nonspecific skin eruption: Secondary | ICD-10-CM | POA: Diagnosis not present

## 2023-06-25 DIAGNOSIS — S72452A Displaced supracondylar fracture without intracondylar extension of lower end of left femur, initial encounter for closed fracture: Secondary | ICD-10-CM | POA: Diagnosis present

## 2023-06-25 DIAGNOSIS — M9712XA 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, initial encounter: Secondary | ICD-10-CM | POA: Diagnosis not present

## 2023-06-25 DIAGNOSIS — D62 Acute posthemorrhagic anemia: Secondary | ICD-10-CM | POA: Diagnosis not present

## 2023-06-25 DIAGNOSIS — Z7901 Long term (current) use of anticoagulants: Secondary | ICD-10-CM

## 2023-06-25 DIAGNOSIS — Z79899 Other long term (current) drug therapy: Secondary | ICD-10-CM | POA: Diagnosis not present

## 2023-06-25 DIAGNOSIS — W19XXXA Unspecified fall, initial encounter: Principal | ICD-10-CM

## 2023-06-25 DIAGNOSIS — E559 Vitamin D deficiency, unspecified: Secondary | ICD-10-CM | POA: Diagnosis present

## 2023-06-25 DIAGNOSIS — F32A Depression, unspecified: Secondary | ICD-10-CM | POA: Diagnosis present

## 2023-06-25 DIAGNOSIS — F419 Anxiety disorder, unspecified: Secondary | ICD-10-CM | POA: Diagnosis present

## 2023-06-25 DIAGNOSIS — M1711 Unilateral primary osteoarthritis, right knee: Secondary | ICD-10-CM | POA: Diagnosis present

## 2023-06-25 DIAGNOSIS — S7225XA Nondisplaced subtrochanteric fracture of left femur, initial encounter for closed fracture: Secondary | ICD-10-CM | POA: Diagnosis not present

## 2023-06-25 DIAGNOSIS — Y92009 Unspecified place in unspecified non-institutional (private) residence as the place of occurrence of the external cause: Secondary | ICD-10-CM

## 2023-06-25 DIAGNOSIS — I10 Essential (primary) hypertension: Secondary | ICD-10-CM | POA: Diagnosis present

## 2023-06-25 DIAGNOSIS — Z923 Personal history of irradiation: Secondary | ICD-10-CM | POA: Diagnosis not present

## 2023-06-25 DIAGNOSIS — S7222XA Displaced subtrochanteric fracture of left femur, initial encounter for closed fracture: Secondary | ICD-10-CM | POA: Diagnosis present

## 2023-06-25 DIAGNOSIS — W010XXA Fall on same level from slipping, tripping and stumbling without subsequent striking against object, initial encounter: Secondary | ICD-10-CM | POA: Diagnosis present

## 2023-06-25 DIAGNOSIS — G894 Chronic pain syndrome: Secondary | ICD-10-CM | POA: Diagnosis present

## 2023-06-25 DIAGNOSIS — S72402A Unspecified fracture of lower end of left femur, initial encounter for closed fracture: Secondary | ICD-10-CM | POA: Diagnosis not present

## 2023-06-25 HISTORY — DX: Malignant (primary) neoplasm, unspecified: C80.1

## 2023-06-25 LAB — CBC WITH DIFFERENTIAL/PLATELET
Abs Immature Granulocytes: 0.06 10*3/uL (ref 0.00–0.07)
Basophils Absolute: 0 10*3/uL (ref 0.0–0.1)
Basophils Relative: 0 %
Eosinophils Absolute: 0 10*3/uL (ref 0.0–0.5)
Eosinophils Relative: 0 %
HCT: 43.6 % (ref 39.0–52.0)
Hemoglobin: 14.5 g/dL (ref 13.0–17.0)
Immature Granulocytes: 0 %
Lymphocytes Relative: 8 %
Lymphs Abs: 1.1 10*3/uL (ref 0.7–4.0)
MCH: 31.5 pg (ref 26.0–34.0)
MCHC: 33.3 g/dL (ref 30.0–36.0)
MCV: 94.6 fL (ref 80.0–100.0)
Monocytes Absolute: 0.8 10*3/uL (ref 0.1–1.0)
Monocytes Relative: 6 %
Neutro Abs: 12.5 10*3/uL — ABNORMAL HIGH (ref 1.7–7.7)
Neutrophils Relative %: 86 %
Platelets: 252 10*3/uL (ref 150–400)
RBC: 4.61 MIL/uL (ref 4.22–5.81)
RDW: 13.8 % (ref 11.5–15.5)
WBC: 14.5 10*3/uL — ABNORMAL HIGH (ref 4.0–10.5)
nRBC: 0 % (ref 0.0–0.2)

## 2023-06-25 LAB — CBC
HCT: 43.1 % (ref 39.0–52.0)
Hemoglobin: 14.5 g/dL (ref 13.0–17.0)
MCH: 30.8 pg (ref 26.0–34.0)
MCHC: 33.6 g/dL (ref 30.0–36.0)
MCV: 91.5 fL (ref 80.0–100.0)
Platelets: 250 10*3/uL (ref 150–400)
RBC: 4.71 MIL/uL (ref 4.22–5.81)
RDW: 13.6 % (ref 11.5–15.5)
WBC: 10.5 10*3/uL (ref 4.0–10.5)
nRBC: 0 % (ref 0.0–0.2)

## 2023-06-25 LAB — BASIC METABOLIC PANEL
Anion gap: 8 (ref 5–15)
BUN: 22 mg/dL (ref 8–23)
CO2: 25 mmol/L (ref 22–32)
Calcium: 8.8 mg/dL — ABNORMAL LOW (ref 8.9–10.3)
Chloride: 104 mmol/L (ref 98–111)
Creatinine, Ser: 0.88 mg/dL (ref 0.61–1.24)
GFR, Estimated: >60 mL/min (ref >60)
Glucose, Bld: 111 mg/dL — ABNORMAL HIGH (ref 70–99)
Potassium: 4.1 mmol/L (ref 3.5–5.1)
Sodium: 137 mmol/L (ref 135–145)

## 2023-06-25 LAB — HIV ANTIBODY (ROUTINE TESTING W REFLEX): HIV Screen 4th Generation wRfx: NONREACTIVE

## 2023-06-25 LAB — CREATININE, SERUM
Creatinine, Ser: 1.01 mg/dL (ref 0.61–1.24)
GFR, Estimated: >60 mL/min (ref >60)

## 2023-06-25 MED ORDER — ACETAMINOPHEN 650 MG RE SUPP: 650.0000 mg | Freq: Four times a day (QID) | RECTAL | Status: DC | PRN

## 2023-06-25 MED ORDER — SENNA 8.6 MG PO TABS
1.0000 | ORAL_TABLET | Freq: Two times a day (BID) | ORAL | Status: DC
  Administered 2023-06-25 – 2023-07-01 (×11): 8.6 mg via ORAL
  Filled 2023-06-25 (×12): qty 1

## 2023-06-25 MED ORDER — HYDROMORPHONE HCL 1 MG/ML IJ SOLN
1.0000 mg | Freq: Once | INTRAMUSCULAR | Status: AC
  Administered 2023-06-25: 1 mg via INTRAVENOUS
  Filled 2023-06-25: qty 1

## 2023-06-25 MED ORDER — CHLORHEXIDINE GLUCONATE 4 % EX SOLN
60.0000 mL | Freq: Once | CUTANEOUS | Status: AC
  Administered 2023-06-26: 4 via TOPICAL
  Filled 2023-06-25: qty 60

## 2023-06-25 MED ORDER — DOCUSATE SODIUM 100 MG PO CAPS
100.0000 mg | ORAL_CAPSULE | Freq: Two times a day (BID) | ORAL | Status: DC
  Administered 2023-06-25: 100 mg via ORAL
  Filled 2023-06-25 (×2): qty 1

## 2023-06-25 MED ORDER — HYDRALAZINE HCL 20 MG/ML IJ SOLN: 10.0000 mg | Freq: Three times a day (TID) | INTRAMUSCULAR | Status: DC | PRN

## 2023-06-25 MED ORDER — ONDANSETRON HCL 4 MG/2ML IJ SOLN: 4.0000 mg | Freq: Four times a day (QID) | INTRAMUSCULAR | Status: DC | PRN

## 2023-06-25 MED ORDER — POLYETHYLENE GLYCOL 3350 17 G PO PACK: 17.0000 g | PACK | Freq: Every day | ORAL | Status: DC | PRN

## 2023-06-25 MED ORDER — ENOXAPARIN SODIUM 40 MG/0.4ML IJ SOSY
40.0000 mg | PREFILLED_SYRINGE | INTRAMUSCULAR | Status: DC
  Administered 2023-06-26 – 2023-07-01 (×6): 40 mg via SUBCUTANEOUS
  Filled 2023-06-25 (×6): qty 0.4

## 2023-06-25 MED ORDER — LIDOCAINE 5 % EX PTCH
1.0000 | MEDICATED_PATCH | CUTANEOUS | Status: DC
  Administered 2023-06-27 – 2023-06-29 (×3): 1 via TRANSDERMAL
  Filled 2023-06-25 (×6): qty 1

## 2023-06-25 MED ORDER — CEFAZOLIN SODIUM-DEXTROSE 2-4 GM/100ML-% IV SOLN
2.0000 g | INTRAVENOUS | Status: AC
  Administered 2023-06-26: 2 g via INTRAVENOUS
  Filled 2023-06-25: qty 100

## 2023-06-25 MED ORDER — HYDROMORPHONE HCL 1 MG/ML IJ SOLN
0.5000 mg | Freq: Once | INTRAMUSCULAR | Status: AC
  Administered 2023-06-25: 0.5 mg via INTRAVENOUS
  Filled 2023-06-25: qty 1

## 2023-06-25 MED ORDER — SODIUM CHLORIDE 0.9 % IV SOLN: INTRAVENOUS | Status: DC

## 2023-06-25 MED ORDER — ONDANSETRON HCL 4 MG PO TABS
4.0000 mg | ORAL_TABLET | Freq: Four times a day (QID) | ORAL | Status: DC | PRN
  Administered 2023-06-26: 4 mg via ORAL
  Filled 2023-06-25: qty 1

## 2023-06-25 MED ORDER — METHOCARBAMOL 500 MG PO TABS
500.0000 mg | ORAL_TABLET | Freq: Two times a day (BID) | ORAL | Status: DC
  Administered 2023-06-25 – 2023-06-26 (×2): 500 mg via ORAL
  Filled 2023-06-25 (×2): qty 1

## 2023-06-25 MED ORDER — HYDROMORPHONE HCL 1 MG/ML IJ SOLN
1.0000 mg | INTRAMUSCULAR | Status: DC | PRN
  Administered 2023-06-25 – 2023-06-26 (×2): 1 mg via INTRAVENOUS
  Filled 2023-06-25 (×2): qty 1

## 2023-06-25 MED ORDER — POVIDONE-IODINE 10 % EX SWAB: 2.0000 | Freq: Once | CUTANEOUS | Status: DC

## 2023-06-25 MED ORDER — TRANEXAMIC ACID-NACL 1000-0.7 MG/100ML-% IV SOLN
1000.0000 mg | INTRAVENOUS | Status: AC
  Administered 2023-06-26: 1000 mg via INTRAVENOUS
  Filled 2023-06-25: qty 100

## 2023-06-25 MED ORDER — ACETAMINOPHEN 325 MG PO TABS: 650.0000 mg | ORAL_TABLET | Freq: Four times a day (QID) | ORAL | Status: DC | PRN

## 2023-06-25 MED ORDER — SERTRALINE HCL 100 MG PO TABS
100.0000 mg | ORAL_TABLET | Freq: Every evening | ORAL | Status: DC
  Administered 2023-06-25 – 2023-06-30 (×6): 100 mg via ORAL
  Filled 2023-06-25 (×7): qty 1

## 2023-06-25 MED ORDER — ALPRAZOLAM 0.5 MG PO TABS
1.0000 mg | ORAL_TABLET | Freq: Every day | ORAL | Status: DC
  Administered 2023-06-25 – 2023-06-30 (×6): 1 mg via ORAL
  Filled 2023-06-25 (×6): qty 2

## 2023-06-25 NOTE — ED Provider Notes (Signed)
[place] EMERGENCY DEPARTMENT AT [facility] Provider Note   CSN: [id number] Arrival date & time: 06/25/23  8295     History {Add pertinent medical, surgical, social history, OB history to HPI:1} Chief Complaint  Patient presents with   Justin Hurst is a 67 y.o. male.  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  Patient states he fell last night and was unable to get up.  He states his wife was already asleep.   Fall       Home Medications Prior to Admission medications   Medication Sig Start Date End Date Taking? Authorizing Provider  ALPRAZolam Prudy Feeler) 1 MG tablet Take 1 mg by mouth 2 (two) times daily.     [provider]  lidocaine (LIDODERM) 5 % Place 1 patch onto the skin daily. Remove & Discard patch within 12 hours or as directed by MD 04/05/23   Smoot, Shawn Route, PA-C  methocarbamol (ROBAXIN) 500 MG tablet Take 1 tablet (500 mg total) by mouth 2 (two) times daily. 04/05/23   Smoot, Shawn Route, PA-C  sertraline (ZOLOFT) 100 MG tablet Take 100 mg by mouth every evening.     [provider]      Allergies    Patient has no known allergies.    Review of Systems   Review of Systems  Physical Exam Updated Vital Signs BP (!) 148/119 (BP Location: Left Arm)   Pulse 84   Temp 97.8 F (36.6 C) (Oral)   Resp 18   Ht 5\' 6"  (1.676 m)   Wt 83.9 kg   SpO2 93%   BMI 29.86 kg/m  Physical Exam  ED Results / Procedures / Treatments   Labs (all labs ordered are listed, but only abnormal results are displayed) Labs Reviewed  CBC WITH DIFFERENTIAL/PLATELET - Abnormal; Notable for the following components:      Result Value   WBC 14.5 (*)    Neutro Abs 12.5 (*)    All other components within normal limits  BASIC METABOLIC PANEL - Abnormal; Notable for the following components:   Glucose, Bld 111 (*)    Calcium 8.8 (*)    All other components within normal limits     EKG None  Radiology DG FEMUR MIN 2 VIEWS LEFT  Result Date: 06/25/2023 CLINICAL DATA:  Fall.  Left hip pain. EXAM: LEFT FEMUR 2 VIEWS; PORTABLE PELVIS 1-2 VIEWS COMPARISON:  Left knee radiographs 04/05/2023 FINDINGS: Pelvis: Mildly decreased bone mineralization. Mild pubic symphysis joint space narrowing with mild subchondral sclerosis and mild superior osteophytosis. Mild-to-moderate left and mild right superomedial femoroacetabular joint space narrowing. Moderate left L3-4 and mild left L4-5 disc space narrowing with moderate left L3-4 and L4-5 left endplate osteophytes. Left femur: Postsurgical changes are seen of total left knee arthroplasty with long femoral stem component, similar to 06/25/2023. There is an acute periprosthetic spiral fracture around the proximal, mid, and distal portions of the left femoral stem prosthesis component. This fracture extends through the lateral cortex of the mid femoral diaphysis just lateral to the proximal tip of the femoral stem with approximately 5 mm cortical step-off. There is up to approximately 5 mm AP dimension diastasis of a more distal spiral fracture. There are again multiple distal femoral metadiaphyseal cerclage wires. There is again high-grade bone thinning at the distal medial aspect of the femoral prosthesis, greatest at the metaphysis. There are again partially visualized lucencies around the  deep aspect of the tibial tray prosthesis component. IMPRESSION: Redemonstration of total left knee arthroplasty with long femoral stem component. Acute periprosthetic spiral fracture around the proximal, mid, and distal portions of the left femoral stem prosthesis component. Electronically Signed   By: Neita Garnet M.D.   On: 06/25/2023 09:30   DG Pelvis Portable  Result Date: 06/25/2023 CLINICAL DATA:  Fall.  Left hip pain. EXAM: LEFT FEMUR 2 VIEWS; PORTABLE PELVIS 1-2 VIEWS COMPARISON:  Left knee radiographs 04/05/2023 FINDINGS: Pelvis: Mildly decreased  bone mineralization. Mild pubic symphysis joint space narrowing with mild subchondral sclerosis and mild superior osteophytosis. Mild-to-moderate left and mild right superomedial femoroacetabular joint space narrowing. Moderate left L3-4 and mild left L4-5 disc space narrowing with moderate left L3-4 and L4-5 left endplate osteophytes. Left femur: Postsurgical changes are seen of total left knee arthroplasty with long femoral stem component, similar to 06/25/2023. There is an acute periprosthetic spiral fracture around the proximal, mid, and distal portions of the left femoral stem prosthesis component. This fracture extends through the lateral cortex of the mid femoral diaphysis just lateral to the proximal tip of the femoral stem with approximately 5 mm cortical step-off. There is up to approximately 5 mm AP dimension diastasis of a more distal spiral fracture. There are again multiple distal femoral metadiaphyseal cerclage wires. There is again high-grade bone thinning at the distal medial aspect of the femoral prosthesis, greatest at the metaphysis. There are again partially visualized lucencies around the deep aspect of the tibial tray prosthesis component. IMPRESSION: Redemonstration of total left knee arthroplasty with long femoral stem component. Acute periprosthetic spiral fracture around the proximal, mid, and distal portions of the left femoral stem prosthesis component. Electronically Signed   By: Neita Garnet M.D.   On: 06/25/2023 09:30    Procedures Procedures  {Document cardiac monitor, telemetry assessment procedure when appropriate:1}  Medications Ordered in ED Medications  HYDROmorphone (DILAUDID) injection 0.5 mg (0.5 mg Intravenous Given 06/25/23 1003)    ED Course/ Medical Decision Making/ A&P  Patient has a femur fracture.  I spoke with orthopedics Dr. Jena Gauss and he reviewed the films.  He wanted to get a CT scan.  Eventually he had his PA see the patient and it was decided the  patient should be admitted to [facility] and Dr. Jena Gauss will operate on the patient's leg tomorrow {   Click here for ABCD2, HEART and other calculatorsREFRESH Note before signing :1}                              Medical Decision Making Amount and/or Complexity of Data Reviewed Labs: ordered. Radiology: ordered.  Risk Prescription drug management. Decision regarding hospitalization.   Left femur fracture.  Patient will be admitted to [facility] and have his femur fracture repaired tomorrow  {Document critical care time when appropriate:1} {Document review of labs and clinical decision tools ie heart score, Chads2Vasc2 etc:1}  {Document your independent review of radiology images, and any outside records:1} {Document your discussion with family members, caretakers, and with consultants:1} {Document social determinants of health affecting pt's care:1} {Document your decision making why or why not admission, treatments were needed:1} Final Clinical Impression(s) / ED Diagnoses Final diagnoses:  Fall, initial encounter    Rx / DC Orders ED Discharge Orders     None

## 2023-06-25 NOTE — Consult Note (Signed)
Reason for Consult:Left distal femur fx Referring Physician: Bethann Berkshire Time called: 1041 Time at bedside: [address]   Justin Hurst is an 67 y.o. male.  HPI: Justin Hurst was at home and slipped and fell. He had immediate left thigh/knee pain and could not get up. He was brought to the ED where x-rays showed a periprosthetic femur fx and orthopedic surgery was consulted. He c/o localized pain to the area. He generally ambulates with a cane or walker.  Past Medical History:  Diagnosis Date   Anxiety    Arthritis    Complication of anesthesia    caused gas    Past Surgical History:  Procedure Laterality Date   ankle fusions     bil at [facility] SURGERY     JOINT REPLACEMENT     revision Dr. Charlann Boxer Left Knee 03-09-18    KNEE ARTHROPLASTY     x2    KNEE ARTHROSCOPY     Left x3   ORIF FEMUR FRACTURE Left 03/09/2018   Procedure: Left knee femoral revision with open reduction internal fixation of distal femur periprosthetic fracture;  Surgeon: Durene Romans, MD;  Location: [facility];  Service: Orthopedics;  Laterality: Left;  Adductor Block   SHOULDER ARTHROSCOPY     Left    History reviewed. No pertinent family history.  Social History:  reports that he has never smoked. He has never used smokeless tobacco. He reports current alcohol use. He reports that he does not use drugs.  Allergies: No Known Allergies  Medications: I have reviewed the patient's current medications.  Results for orders placed or performed during the hospital encounter of 06/25/23 (from the past 48 hour(s))  CBC with Differential     Status: Abnormal   Collection Time: 06/25/23  7:50 AM  Result Value Ref Range   WBC 14.5 (H) 4.0 - 10.5 K/uL   RBC 4.61 4.22 - 5.81 MIL/uL   Hemoglobin 14.5 13.0 - 17.0 g/dL   HCT 28.4 13.2 - 44.0 %   MCV 94.6 80.0 - 100.0 fL   MCH 31.5 26.0 - 34.0 pg   MCHC 33.3 30.0 - 36.0 g/dL   RDW 10.2 72.5 - 36.6 %   Platelets 252 150 - 400 K/uL   nRBC 0.0 0.0 - 0.2 %   Neutrophils  Relative % 86 %   Neutro Abs 12.5 (H) 1.7 - 7.7 K/uL   Lymphocytes Relative 8 %   Lymphs Abs 1.1 0.7 - 4.0 K/uL   Monocytes Relative 6 %   Monocytes Absolute 0.8 0.1 - 1.0 K/uL   Eosinophils Relative 0 %   Eosinophils Absolute 0.0 0.0 - 0.5 K/uL   Basophils Relative 0 %   Basophils Absolute 0.0 0.0 - 0.1 K/uL   Immature Granulocytes 0 %   Abs Immature Granulocytes 0.06 0.00 - 0.07 K/uL    Comment: Performed at [facility], [address], [place], [place] [address]  Basic metabolic panel     Status: Abnormal   Collection Time: 06/25/23  7:50 AM  Result Value Ref Range   Sodium 137 135 - 145 mmol/L   Potassium 4.1 3.5 - 5.1 mmol/L   Chloride 104 98 - 111 mmol/L   CO2 25 22 - 32 mmol/L   Glucose, Bld 111 (H) 70 - 99 mg/dL    Comment: Glucose reference range applies only to samples taken after fasting for at least 8 hours.   BUN 22 8 - 23 mg/dL   Creatinine, Ser  0.88 0.61 - 1.24 mg/dL   Calcium 8.8 (L) 8.9 - 10.3 mg/dL   GFR, Estimated >69 >62 mL/min    Comment: (NOTE) Calculated using the CKD-EPI Creatinine Equation (2021)    Anion gap 8 5 - 15    Comment: Performed at [facility], [address], [place], [place] [address]    DG FEMUR MIN 2 VIEWS LEFT  Result Date: 06/25/2023 CLINICAL DATA:  Fall.  Left hip pain. EXAM: LEFT FEMUR 2 VIEWS; PORTABLE PELVIS 1-2 VIEWS COMPARISON:  Left knee radiographs 04/05/2023 FINDINGS: Pelvis: Mildly decreased bone mineralization. Mild pubic symphysis joint space narrowing with mild subchondral sclerosis and mild superior osteophytosis. Mild-to-moderate left and mild right superomedial femoroacetabular joint space narrowing. Moderate left L3-4 and mild left L4-5 disc space narrowing with moderate left L3-4 and L4-5 left endplate osteophytes. Left femur: Postsurgical changes are seen of total left knee arthroplasty with long femoral stem component, similar to 06/25/2023. There is an acute periprosthetic spiral  fracture around the proximal, mid, and distal portions of the left femoral stem prosthesis component. This fracture extends through the lateral cortex of the mid femoral diaphysis just lateral to the proximal tip of the femoral stem with approximately 5 mm cortical step-off. There is up to approximately 5 mm AP dimension diastasis of a more distal spiral fracture. There are again multiple distal femoral metadiaphyseal cerclage wires. There is again high-grade bone thinning at the distal medial aspect of the femoral prosthesis, greatest at the metaphysis. There are again partially visualized lucencies around the deep aspect of the tibial tray prosthesis component. IMPRESSION: Redemonstration of total left knee arthroplasty with long femoral stem component. Acute periprosthetic spiral fracture around the proximal, mid, and distal portions of the left femoral stem prosthesis component. Electronically Signed   By: Neita Garnet M.D.   On: 06/25/2023 09:30   DG Pelvis Portable  Result Date: 06/25/2023 CLINICAL DATA:  Fall.  Left hip pain. EXAM: LEFT FEMUR 2 VIEWS; PORTABLE PELVIS 1-2 VIEWS COMPARISON:  Left knee radiographs 04/05/2023 FINDINGS: Pelvis: Mildly decreased bone mineralization. Mild pubic symphysis joint space narrowing with mild subchondral sclerosis and mild superior osteophytosis. Mild-to-moderate left and mild right superomedial femoroacetabular joint space narrowing. Moderate left L3-4 and mild left L4-5 disc space narrowing with moderate left L3-4 and L4-5 left endplate osteophytes. Left femur: Postsurgical changes are seen of total left knee arthroplasty with long femoral stem component, similar to 06/25/2023. There is an acute periprosthetic spiral fracture around the proximal, mid, and distal portions of the left femoral stem prosthesis component. This fracture extends through the lateral cortex of the mid femoral diaphysis just lateral to the proximal tip of the femoral stem with approximately 5  mm cortical step-off. There is up to approximately 5 mm AP dimension diastasis of a more distal spiral fracture. There are again multiple distal femoral metadiaphyseal cerclage wires. There is again high-grade bone thinning at the distal medial aspect of the femoral prosthesis, greatest at the metaphysis. There are again partially visualized lucencies around the deep aspect of the tibial tray prosthesis component. IMPRESSION: Redemonstration of total left knee arthroplasty with long femoral stem component. Acute periprosthetic spiral fracture around the proximal, mid, and distal portions of the left femoral stem prosthesis component. Electronically Signed   By: Neita Garnet M.D.   On: 06/25/2023 09:30    Review of Systems  HENT:  Negative for ear discharge, ear pain, hearing loss and tinnitus.   Eyes:  Negative for photophobia and pain.  Respiratory:  Negative for cough and shortness of breath.   Cardiovascular:  Negative for chest pain.  Gastrointestinal:  Negative for abdominal pain, nausea and vomiting.  Genitourinary:  Negative for dysuria, flank pain, frequency and urgency.  Musculoskeletal:  Positive for arthralgias (Left knee). Negative for back pain, myalgias and neck pain.  Neurological:  Negative for dizziness and headaches.  Hematological:  Does not bruise/bleed easily.  Psychiatric/Behavioral:  The patient is not nervous/anxious.    Blood pressure (!) 148/119, pulse 84, temperature 97.8 F (36.6 C), temperature source Oral, resp. rate 18, height 5\' 6"  (1.676 m), weight 83.9 kg, SpO2 93%. Physical Exam Constitutional:      General: He is not in acute distress.    Appearance: He is well-developed. He is not diaphoretic.  HENT:     Head: Normocephalic and atraumatic.  Eyes:     General: No scleral icterus.       Right eye: No discharge.        Left eye: No discharge.     Conjunctiva/sclera: Conjunctivae normal.  Cardiovascular:     Rate and Rhythm: Normal rate and regular  rhythm.  Pulmonary:     Effort: Pulmonary effort is normal. No respiratory distress.  Musculoskeletal:     Cervical back: Normal range of motion.     Comments: LLE No traumatic wounds, ecchymosis, or rash  Mod TTP distal thigh  No knee or ankle effusion  Sens DPN, SPN, TN intact  Motor EHL, ext, flex, evers 5/5  DP 2+, PT 2+, No significant edema  Skin:    General: Skin is warm and dry.  Neurological:     Mental Status: He is alert.  Psychiatric:        Mood and Affect: Mood normal.        Behavior: Behavior normal.     Assessment/Plan: Left periprosthetic distal femur fx -- Plan ORIF tomorrow with Dr. Carola Frost or Friday with Dr. Jena Gauss depending on operative load. Please keep NPO after MN.    Freeman Caldron, PA-C Orthopedic Surgery [phone] 06/25/2023, 12:11 PM

## 2023-06-25 NOTE — H&P (Signed)
History and Physical    Justin Hurst VHQ:[id number] DOB: 30-Aug-1956 DOA: 06/25/2023  PCP: [facility]   Patient coming from: Home  I have personally briefly reviewed patient's old medical records in [facility]  Chief Complaint: Left Knee pain following fall yesterday  HPI: Justin Hurst is a 67 y.o. male with PMH significant for depression, anxiety, left knee replacement x 3 presented status post mechanical fall yesterday.  Patient denies any dizziness or lightheadedness prior to the fall.  States he tripped and fell in the kitchen and was unable to stand following fall.  His family has helped him get up and put him in a bed.  He described pain as severe 7 /10 on pain scale,  sharp partially improved with pain medicine.  Patient presented in the ED and admitted.  ED Course: He was hemodynamically stable except hypertension. Temp 97.8, HR 84, RR 18, BP 140/92, SpO2 93% on room air Labs include sodium 137, potassium 4.1, chloride 104, bicarb 25, glucose 111, BUN 22, creatinine 0.88, calcium 8.8, WBC 14.4, hemoglobin 14.5, hematocrit 43.6, platelet 252, X-ray left hip, redemonstration of total left hip arthroplasty with long femoral stem component.  Acute periprosthetic spiral fracture around the proximal mid and distal portion of left femoral stem prosthesis component.   Review of Systems: Review of Systems  Constitutional: Negative.   HENT: Negative.    Eyes: Negative.   Respiratory: Negative.    Cardiovascular: Negative.   Gastrointestinal: Negative.   Genitourinary: Negative.   Musculoskeletal:  Positive for falls.       Left leg and knee pain  Skin: Negative.   Neurological: Negative.   Endo/Heme/Allergies: Negative.   Psychiatric/Behavioral:  Positive for depression. The patient is nervous/anxious.     Past Medical History:  Diagnosis Date   Anxiety    Arthritis    Complication of anesthesia    caused gas    Past Surgical History:   Procedure Laterality Date   ankle fusions     bil at [facility] SURGERY     JOINT REPLACEMENT     revision Dr. Charlann Boxer Left Knee 03-09-18    KNEE ARTHROPLASTY     x2    KNEE ARTHROSCOPY     Left x3   ORIF FEMUR FRACTURE Left 03/09/2018   Procedure: Left knee femoral revision with open reduction internal fixation of distal femur periprosthetic fracture;  Surgeon: Durene Romans, MD;  Location: [facility];  Service: Orthopedics;  Laterality: Left;  Adductor Block   SHOULDER ARTHROSCOPY     Left     reports that he has never smoked. He has never used smokeless tobacco. He reports current alcohol use. He reports that he does not use drugs.  No Known Allergies  History reviewed. No pertinent family history. Family history reviewed and not pertinent.  Prior to Admission medications   Medication Sig Start Date End Date Taking? Authorizing Provider  ALPRAZolam Prudy Feeler) 1 MG tablet Take 1 mg by mouth 2 (two) times daily.     [provider]  lidocaine (LIDODERM) 5 % Place 1 patch onto the skin daily. Remove & Discard patch within 12 hours or as directed by MD 04/05/23   Smoot, Shawn Route, PA-C  methocarbamol (ROBAXIN) 500 MG tablet Take 1 tablet (500 mg total) by mouth 2 (two) times daily. 04/05/23   Smoot, Shawn Route, PA-C  sertraline (ZOLOFT) 100 MG tablet Take 100 mg by mouth every evening.     [provider]    Physical Exam: Vitals:   06/25/23 0745 06/25/23 0748 06/25/23 0905 06/25/23 1156  BP: 124/85  (!) 140/92 (!) 148/119  Pulse: 81  75 84  Resp: 18  18 18   Temp: 98.2 F (36.8 C)   97.8 F (36.6 C)  TempSrc: Oral   Oral  SpO2: 93%  94% 93%  Weight:  83.9 kg    Height:  5\' 6"  (1.676 m)      Constitutional: Appears calm and comfortable, not in any acute distress. Vitals:   06/25/23 0745 06/25/23 0748 06/25/23 0905 06/25/23 1156  BP: 124/85  (!) 140/92 (!) 148/119  Pulse: 81  75 84  Resp: 18  18 18   Temp: 98.2 F (36.8 C)   97.8 F (36.6 C)  TempSrc: Oral   Oral   SpO2: 93%  94% 93%  Weight:  83.9 kg    Height:  5\' 6"  (1.676 m)     Eyes: PERRL, lids and conjunctivae normal ENMT: Mucous membranes are moist. Posterior pharynx clear of any exudate or lesions.Normal dentition.  Neck: normal, supple, no masses, no thyromegaly Respiratory: Clear to auscultation bilaterally, no wheezing, no crackles. Normal respiratory effort. No accessory muscle use.  Cardiovascular: S1 S2 heard,  Regular rate and rhythm, no murmurs / rubs / gallops. No extremity edema. Abdomen: no tenderness, no masses palpated. No hepatosplenomegaly. Bowel sounds positive.  Musculoskeletal: Left leg tenderness noted, restricted movements, no joint deformity upper and lower extremities..  Skin: no rashes, lesions, ulcers. No induration Neurologic: CN 2-12 grossly intact. Sensation intact, DTR normal. Strength 5/5 in all 4.  Psychiatric: Normal judgment and insight. Alert and oriented x 3. Normal mood.    Labs on Admission: I have personally reviewed following labs and imaging studies  CBC: Recent Labs  Lab 06/25/23 0750  WBC 14.5*  NEUTROABS 12.5*  HGB 14.5  HCT 43.6  MCV 94.6  PLT 252   Basic Metabolic Panel: Recent Labs  Lab 06/25/23 0750  NA 137  K 4.1  CL 104  CO2 25  GLUCOSE 111*  BUN 22  CREATININE 0.88  CALCIUM 8.8*   GFR: Estimated Creatinine Clearance: 82.7 mL/min (by C-G formula based on SCr of 0.88 mg/dL). Liver Function Tests: No results for input(s): "AST", "ALT", "ALKPHOS", "BILITOT", "PROT", "ALBUMIN" in the last 168 hours. No results for input(s): "LIPASE", "AMYLASE" in the last 168 hours. No results for input(s): "AMMONIA" in the last 168 hours. Coagulation Profile: No results for input(s): "INR", "PROTIME" in the last 168 hours. Cardiac Enzymes: No results for input(s): "CKTOTAL", "CKMB", "CKMBINDEX", "TROPONINI" in the last 168 hours. BNP (last 3 results) No results for input(s): "PROBNP" in the last 8760 hours. HbA1C: No results for  input(s): "HGBA1C" in the last 72 hours. CBG: No results for input(s): "GLUCAP" in the last 168 hours. Lipid Profile: No results for input(s): "CHOL", "HDL", "LDLCALC", "TRIG", "CHOLHDL", "LDLDIRECT" in the last 72 hours. Thyroid Function Tests: No results for input(s): "TSH", "T4TOTAL", "FREET4", "T3FREE", "THYROIDAB" in the last 72 hours. Anemia Panel: No results for input(s): "VITAMINB12", "FOLATE", "FERRITIN", "TIBC", "IRON", "RETICCTPCT" in the last 72 hours. Urine analysis:    Component Value Date/Time   COLORURINE YELLOW 08/06/2010 1054   APPEARANCEUR CLEAR 08/06/2010 1054   LABSPEC 1.012 08/06/2010 1054   PHURINE 6.5 08/06/2010 1054   GLUCOSEU NEGATIVE 08/06/2010 1054   HGBUR NEGATIVE 08/06/2010 1054   BILIRUBINUR NEGATIVE 08/06/2010 1054   KETONESUR NEGATIVE 08/06/2010 1054   PROTEINUR NEGATIVE 08/06/2010 1054  UROBILINOGEN 0.2 08/06/2010 1054   NITRITE NEGATIVE 08/06/2010 1054   LEUKOCYTESUR  08/06/2010 1054    NEGATIVE MICROSCOPIC NOT DONE ON URINES WITH NEGATIVE PROTEIN, BLOOD, LEUKOCYTES, NITRITE, OR GLUCOSE <1000 mg/dL.    Radiological Exams on Admission: DG FEMUR MIN 2 VIEWS LEFT  Result Date: 06/25/2023 CLINICAL DATA:  Fall.  Left hip pain. EXAM: LEFT FEMUR 2 VIEWS; PORTABLE PELVIS 1-2 VIEWS COMPARISON:  Left knee radiographs 04/05/2023 FINDINGS: Pelvis: Mildly decreased bone mineralization. Mild pubic symphysis joint space narrowing with mild subchondral sclerosis and mild superior osteophytosis. Mild-to-moderate left and mild right superomedial femoroacetabular joint space narrowing. Moderate left L3-4 and mild left L4-5 disc space narrowing with moderate left L3-4 and L4-5 left endplate osteophytes. Left femur: Postsurgical changes are seen of total left knee arthroplasty with long femoral stem component, similar to 06/25/2023. There is an acute periprosthetic spiral fracture around the proximal, mid, and distal portions of the left femoral stem prosthesis component.  This fracture extends through the lateral cortex of the mid femoral diaphysis just lateral to the proximal tip of the femoral stem with approximately 5 mm cortical step-off. There is up to approximately 5 mm AP dimension diastasis of a more distal spiral fracture. There are again multiple distal femoral metadiaphyseal cerclage wires. There is again high-grade bone thinning at the distal medial aspect of the femoral prosthesis, greatest at the metaphysis. There are again partially visualized lucencies around the deep aspect of the tibial tray prosthesis component. IMPRESSION: Redemonstration of total left knee arthroplasty with long femoral stem component. Acute periprosthetic spiral fracture around the proximal, mid, and distal portions of the left femoral stem prosthesis component. Electronically Signed   By: Neita Garnet M.D.   On: 06/25/2023 09:30   DG Pelvis Portable  Result Date: 06/25/2023 CLINICAL DATA:  Fall.  Left hip pain. EXAM: LEFT FEMUR 2 VIEWS; PORTABLE PELVIS 1-2 VIEWS COMPARISON:  Left knee radiographs 04/05/2023 FINDINGS: Pelvis: Mildly decreased bone mineralization. Mild pubic symphysis joint space narrowing with mild subchondral sclerosis and mild superior osteophytosis. Mild-to-moderate left and mild right superomedial femoroacetabular joint space narrowing. Moderate left L3-4 and mild left L4-5 disc space narrowing with moderate left L3-4 and L4-5 left endplate osteophytes. Left femur: Postsurgical changes are seen of total left knee arthroplasty with long femoral stem component, similar to 06/25/2023. There is an acute periprosthetic spiral fracture around the proximal, mid, and distal portions of the left femoral stem prosthesis component. This fracture extends through the lateral cortex of the mid femoral diaphysis just lateral to the proximal tip of the femoral stem with approximately 5 mm cortical step-off. There is up to approximately 5 mm AP dimension diastasis of a more distal  spiral fracture. There are again multiple distal femoral metadiaphyseal cerclage wires. There is again high-grade bone thinning at the distal medial aspect of the femoral prosthesis, greatest at the metaphysis. There are again partially visualized lucencies around the deep aspect of the tibial tray prosthesis component. IMPRESSION: Redemonstration of total left knee arthroplasty with long femoral stem component. Acute periprosthetic spiral fracture around the proximal, mid, and distal portions of the left femoral stem prosthesis component. Electronically Signed   By: Neita Garnet M.D.   On: 06/25/2023 09:30    EKG: Obtain EKG. Please review.  Assessment/Plan Principal Problem:   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 Active Problems:   Chronic pain syndrome   Closed left subtrochanteric femur fracture (HCC)   Left periprosthetic distal femur fx: Patient with history of left  knee replacement x 3 presented status post fall at home. He was unable to stand due to the pain. X-ray showed left periprosthetic distal femur fracture. Orthopedic consulted, recommended CT left femur, CT left knee Admit at [facility]. Adequate pain control with Dilaudid as needed Continue IV fluid resuscitation. N.p.o. at midnight for ORIF in the morning. PT and OT postoperatively. Continue Lovenox for DVT prophylaxis  Leukocytosis: Likely reactive in the setting of fall and fracture. Continue to monitor  Anxiety/depression Resume Zoloft and alprazolam.  Elevated blood pressure: Likely due to the pain. Start hydralazine as needed for SBP above 170.   DVT prophylaxis: Lovenox Code Status: Full code Family Communication: Wife and daughter at bedside Disposition Plan:   Status is: Inpatient Remains inpatient appropriate because: Admitted s/p fall with Left periprosthetic distal femur fx, orth consulted, scheduled for ORIF tomorrow .   Consults called: Orthopedics Admission  status: Inpatient   Willeen Niece MD [facility]   If 7PM-7AM, please contact night-coverage [email or web address]   06/25/2023, 1:07 PM

## 2023-06-25 NOTE — ED Triage Notes (Signed)
Patient presented from home via [facility] for a fall. Patient walks with a walker and tripped over rug in kitchen. Patient denies taking blood thinners, denies hitting head, denies LOC. Pain noted to L hip.

## 2023-06-26 ENCOUNTER — Inpatient Hospital Stay (HOSPITAL_COMMUNITY): Payer: Medicare Other

## 2023-06-26 ENCOUNTER — Encounter (HOSPITAL_COMMUNITY): Payer: Self-pay | Admitting: Family Medicine

## 2023-06-26 ENCOUNTER — Inpatient Hospital Stay (HOSPITAL_COMMUNITY): Payer: Medicare Other | Admitting: Anesthesiology

## 2023-06-26 ENCOUNTER — Other Ambulatory Visit: Payer: Self-pay

## 2023-06-26 ENCOUNTER — Encounter (HOSPITAL_COMMUNITY)
Admission: EM | Disposition: A | Discharge status: Skilled Nursing Facility | Payer: Self-pay | Source: Home / Self Care | Attending: Internal Medicine

## 2023-06-26 DIAGNOSIS — S7225XA Nondisplaced subtrochanteric fracture of left femur, initial encounter for closed fracture: Secondary | ICD-10-CM

## 2023-06-26 DIAGNOSIS — S72402A Unspecified fracture of lower end of left femur, initial encounter for closed fracture: Secondary | ICD-10-CM | POA: Diagnosis not present

## 2023-06-26 DIAGNOSIS — G894 Chronic pain syndrome: Secondary | ICD-10-CM

## 2023-06-26 DIAGNOSIS — M9712XA 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, initial encounter: Secondary | ICD-10-CM | POA: Diagnosis not present

## 2023-06-26 HISTORY — PX: ORIF FEMUR FRACTURE: SHX2119

## 2023-06-26 LAB — CBC
HCT: 39.8 % (ref 39.0–52.0)
Hemoglobin: 13.1 g/dL (ref 13.0–17.0)
MCH: 30.5 pg (ref 26.0–34.0)
MCHC: 32.9 g/dL (ref 30.0–36.0)
MCV: 92.8 fL (ref 80.0–100.0)
Platelets: 218 10*3/uL (ref 150–400)
RBC: 4.29 MIL/uL (ref 4.22–5.81)
RDW: 13.8 % (ref 11.5–15.5)
WBC: 8.7 10*3/uL (ref 4.0–10.5)
nRBC: 0 % (ref 0.0–0.2)

## 2023-06-26 LAB — SURGICAL PCR SCREEN
MRSA, PCR: POSITIVE — AB
Staphylococcus aureus: POSITIVE — AB

## 2023-06-26 LAB — COMPREHENSIVE METABOLIC PANEL
ALT: 27 U/L (ref 0–44)
AST: 24 U/L (ref 15–41)
Albumin: 3.4 g/dL — ABNORMAL LOW (ref 3.5–5.0)
Alkaline Phosphatase: 91 U/L (ref 38–126)
Anion gap: 9 (ref 5–15)
BUN: 25 mg/dL — ABNORMAL HIGH (ref 8–23)
CO2: 26 mmol/L (ref 22–32)
Calcium: 8.7 mg/dL — ABNORMAL LOW (ref 8.9–10.3)
Chloride: 100 mmol/L (ref 98–111)
Creatinine, Ser: 0.96 mg/dL (ref 0.61–1.24)
GFR, Estimated: >60 mL/min (ref >60)
Glucose, Bld: 102 mg/dL — ABNORMAL HIGH (ref 70–99)
Potassium: 3.5 mmol/L (ref 3.5–5.1)
Sodium: 135 mmol/L (ref 135–145)
Total Bilirubin: 0.8 mg/dL (ref 0.3–1.2)
Total Protein: 6.7 g/dL (ref 6.5–8.1)

## 2023-06-26 LAB — MAGNESIUM: Magnesium: 2.1 mg/dL (ref 1.7–2.4)

## 2023-06-26 LAB — PHOSPHORUS: Phosphorus: 2.7 mg/dL (ref 2.5–4.6)

## 2023-06-26 SURGERY — OPEN REDUCTION INTERNAL FIXATION (ORIF) DISTAL FEMUR FRACTURE
Anesthesia: General | Laterality: Left

## 2023-06-26 MED ORDER — METOCLOPRAMIDE HCL 5 MG PO TABS: 5.0000 mg | ORAL_TABLET | Freq: Three times a day (TID) | ORAL | Status: DC | PRN

## 2023-06-26 MED ORDER — METOCLOPRAMIDE HCL 5 MG/ML IJ SOLN: 5.0000 mg | Freq: Three times a day (TID) | INTRAMUSCULAR | Status: DC | PRN

## 2023-06-26 MED ORDER — MUPIROCIN 2 % EX OINT: 1.0000 | TOPICAL_OINTMENT | Freq: Two times a day (BID) | CUTANEOUS | 0 refills | Status: AC

## 2023-06-26 MED ORDER — MIDAZOLAM HCL 2 MG/2ML IJ SOLN: 2.0000 mg | Freq: Once | INTRAMUSCULAR | Status: AC

## 2023-06-26 MED ORDER — SUGAMMADEX SODIUM 200 MG/2ML IV SOLN
INTRAVENOUS | Status: DC | PRN
  Administered 2023-06-26: 100 mg via INTRAVENOUS

## 2023-06-26 MED ORDER — ONDANSETRON HCL 4 MG/2ML IJ SOLN
INTRAMUSCULAR | Status: AC
  Filled 2023-06-26: qty 4

## 2023-06-26 MED ORDER — ONDANSETRON HCL 4 MG/2ML IJ SOLN
INTRAMUSCULAR | Status: DC | PRN
  Administered 2023-06-26: 4 mg via INTRAVENOUS

## 2023-06-26 MED ORDER — LIDOCAINE 2% (20 MG/ML) 5 ML SYRINGE
INTRAMUSCULAR | Status: DC | PRN
  Administered 2023-06-26: 40 mg via INTRAVENOUS

## 2023-06-26 MED ORDER — TRANEXAMIC ACID-NACL 1000-0.7 MG/100ML-% IV SOLN
1000.0000 mg | Freq: Once | INTRAVENOUS | Status: AC
  Administered 2023-06-26: 1000 mg via INTRAVENOUS
  Filled 2023-06-26: qty 100

## 2023-06-26 MED ORDER — MIDAZOLAM HCL 2 MG/2ML IJ SOLN
INTRAMUSCULAR | Status: AC
  Filled 2023-06-26: qty 2

## 2023-06-26 MED ORDER — 0.9 % SODIUM CHLORIDE (POUR BTL) OPTIME
TOPICAL | Status: DC | PRN
  Administered 2023-06-26: 1000 mL

## 2023-06-26 MED ORDER — OXYCODONE HCL 5 MG PO TABS
5.0000 mg | ORAL_TABLET | ORAL | Status: DC | PRN
  Administered 2023-06-26 – 2023-06-30 (×15): 10 mg via ORAL
  Filled 2023-06-26 (×8): qty 2
  Filled 2023-06-26: qty 1
  Filled 2023-06-26 (×7): qty 2
  Filled 2023-06-26: qty 1
  Filled 2023-06-26: qty 2

## 2023-06-26 MED ORDER — LIDOCAINE 2% (20 MG/ML) 5 ML SYRINGE
INTRAMUSCULAR | Status: AC
  Filled 2023-06-26: qty 10

## 2023-06-26 MED ORDER — FENTANYL CITRATE (PF) 100 MCG/2ML IJ SOLN: 100.0000 ug | Freq: Once | INTRAMUSCULAR | Status: AC

## 2023-06-26 MED ORDER — ORAL CARE MOUTH RINSE: 15.0000 mL | Freq: Once | OROMUCOSAL | Status: AC

## 2023-06-26 MED ORDER — CHLORHEXIDINE GLUCONATE 0.12 % MT SOLN
15.0000 mL | Freq: Once | OROMUCOSAL | Status: AC
  Administered 2023-06-26: 15 mL via OROMUCOSAL

## 2023-06-26 MED ORDER — CHLORHEXIDINE GLUCONATE 0.12 % MT SOLN
OROMUCOSAL | Status: AC
  Filled 2023-06-26: qty 15

## 2023-06-26 MED ORDER — PROPOFOL 10 MG/ML IV BOLUS
INTRAVENOUS | Status: DC | PRN
  Administered 2023-06-26: 150 mg via INTRAVENOUS

## 2023-06-26 MED ORDER — DEXAMETHASONE SODIUM PHOSPHATE 10 MG/ML IJ SOLN: INTRAMUSCULAR | Status: DC | PRN

## 2023-06-26 MED ORDER — ROCURONIUM BROMIDE 10 MG/ML (PF) SYRINGE
PREFILLED_SYRINGE | INTRAVENOUS | Status: DC | PRN
  Administered 2023-06-26: 50 mg via INTRAVENOUS
  Administered 2023-06-26: 20 mg via INTRAVENOUS

## 2023-06-26 MED ORDER — PHENOL 1.4 % MT LIQD: 1.0000 | OROMUCOSAL | Status: DC | PRN

## 2023-06-26 MED ORDER — CHLORHEXIDINE GLUCONATE 4 % EX SOLN: 1.0000 | CUTANEOUS | 1 refills | Status: DC

## 2023-06-26 MED ORDER — CEFAZOLIN SODIUM-DEXTROSE 2-4 GM/100ML-% IV SOLN
2.0000 g | Freq: Four times a day (QID) | INTRAVENOUS | Status: AC
  Administered 2023-06-26 (×2): 2 g via INTRAVENOUS
  Filled 2023-06-26 (×2): qty 100

## 2023-06-26 MED ORDER — DEXAMETHASONE SODIUM PHOSPHATE 10 MG/ML IJ SOLN
INTRAMUSCULAR | Status: DC | PRN
Start: 2023-06-26 — End: 2023-06-26
  Administered 2023-06-26: 10 mg

## 2023-06-26 MED ORDER — MENTHOL 3 MG MT LOZG: 1.0000 | LOZENGE | OROMUCOSAL | Status: DC | PRN

## 2023-06-26 MED ORDER — SIMETHICONE 40 MG/0.6ML PO SUSP: 40.0000 mg | Freq: Four times a day (QID) | ORAL | Status: DC | PRN

## 2023-06-26 MED ORDER — HYDROMORPHONE HCL 1 MG/ML IJ SOLN
0.5000 mg | INTRAMUSCULAR | Status: DC | PRN
  Administered 2023-06-27 – 2023-06-28 (×2): 1 mg via INTRAVENOUS
  Filled 2023-06-26 (×3): qty 1

## 2023-06-26 MED ORDER — LABETALOL HCL 5 MG/ML IV SOLN
10.0000 mg | Freq: Once | INTRAVENOUS | Status: AC
  Administered 2023-06-26: 10 mg via INTRAVENOUS

## 2023-06-26 MED ORDER — FENTANYL CITRATE (PF) 100 MCG/2ML IJ SOLN
INTRAMUSCULAR | Status: AC
  Administered 2023-06-26: 100 ug via INTRAVENOUS
  Filled 2023-06-26: qty 2

## 2023-06-26 MED ORDER — DOCUSATE SODIUM 100 MG PO CAPS
100.0000 mg | ORAL_CAPSULE | Freq: Two times a day (BID) | ORAL | Status: DC
  Administered 2023-06-26 – 2023-07-01 (×10): 100 mg via ORAL
  Filled 2023-06-26 (×10): qty 1

## 2023-06-26 MED ORDER — LABETALOL HCL 5 MG/ML IV SOLN
INTRAVENOUS | Status: AC
  Filled 2023-06-26: qty 4

## 2023-06-26 MED ORDER — ROCURONIUM BROMIDE 10 MG/ML (PF) SYRINGE
PREFILLED_SYRINGE | INTRAVENOUS | Status: AC
  Filled 2023-06-26: qty 20

## 2023-06-26 MED ORDER — PHENYLEPHRINE 80 MCG/ML (10ML) SYRINGE FOR IV PUSH (FOR BLOOD PRESSURE SUPPORT)
PREFILLED_SYRINGE | INTRAVENOUS | Status: DC | PRN
  Administered 2023-06-26: 80 ug via INTRAVENOUS
  Administered 2023-06-26: 40 ug via INTRAVENOUS
  Administered 2023-06-26 (×2): 80 ug via INTRAVENOUS

## 2023-06-26 MED ORDER — METHOCARBAMOL 1000 MG/10ML IJ SOLN
500.0000 mg | Freq: Three times a day (TID) | INTRAVENOUS | Status: DC
  Filled 2023-06-26: qty 5

## 2023-06-26 MED ORDER — FENTANYL CITRATE (PF) 250 MCG/5ML IJ SOLN
INTRAMUSCULAR | Status: DC | PRN
  Administered 2023-06-26: 100 ug via INTRAVENOUS
  Administered 2023-06-26: 50 ug via INTRAVENOUS

## 2023-06-26 MED ORDER — ROPIVACAINE HCL 7.5 MG/ML IJ SOLN
INTRAMUSCULAR | Status: DC | PRN
Start: 2023-06-26 — End: 2023-06-26
  Administered 2023-06-26: 20 mL via PERINEURAL

## 2023-06-26 MED ORDER — ONDANSETRON HCL 4 MG/2ML IJ SOLN: 4.0000 mg | Freq: Four times a day (QID) | INTRAMUSCULAR | Status: DC | PRN

## 2023-06-26 MED ORDER — ACETAMINOPHEN 500 MG PO TABS
1000.0000 mg | ORAL_TABLET | Freq: Three times a day (TID) | ORAL | Status: DC
  Administered 2023-06-26 – 2023-07-01 (×11): 1000 mg via ORAL
  Filled 2023-06-26 (×11): qty 2

## 2023-06-26 MED ORDER — PHENYLEPHRINE HCL-NACL 20-0.9 MG/250ML-% IV SOLN
INTRAVENOUS | Status: DC | PRN
  Administered 2023-06-26: 25 ug/min via INTRAVENOUS

## 2023-06-26 MED ORDER — LACTATED RINGERS IV SOLN: INTRAVENOUS | Status: DC

## 2023-06-26 MED ORDER — MIDAZOLAM HCL 2 MG/2ML IJ SOLN
INTRAMUSCULAR | Status: AC
  Administered 2023-06-26: 2 mg via INTRAVENOUS
  Filled 2023-06-26: qty 2

## 2023-06-26 MED ORDER — ACETAMINOPHEN 325 MG PO TABS
325.0000 mg | ORAL_TABLET | Freq: Four times a day (QID) | ORAL | Status: DC | PRN
  Administered 2023-06-27: 650 mg via ORAL
  Administered 2023-06-28: 325 mg via ORAL
  Administered 2023-06-29: 650 mg via ORAL
  Filled 2023-06-26 (×3): qty 2

## 2023-06-26 MED ORDER — DEXAMETHASONE SODIUM PHOSPHATE 10 MG/ML IJ SOLN
INTRAMUSCULAR | Status: AC
  Filled 2023-06-26: qty 2

## 2023-06-26 MED ORDER — METHOCARBAMOL 500 MG PO TABS
500.0000 mg | ORAL_TABLET | Freq: Three times a day (TID) | ORAL | Status: DC
  Administered 2023-06-26 – 2023-07-01 (×15): 500 mg via ORAL
  Filled 2023-06-26 (×15): qty 1

## 2023-06-26 MED ORDER — ONDANSETRON HCL 4 MG PO TABS: 4.0000 mg | ORAL_TABLET | Freq: Four times a day (QID) | ORAL | Status: DC | PRN

## 2023-06-26 MED ORDER — PHENYLEPHRINE 80 MCG/ML (10ML) SYRINGE FOR IV PUSH (FOR BLOOD PRESSURE SUPPORT)
PREFILLED_SYRINGE | INTRAVENOUS | Status: AC
  Filled 2023-06-26: qty 10

## 2023-06-26 MED ORDER — FENTANYL CITRATE (PF) 250 MCG/5ML IJ SOLN
INTRAMUSCULAR | Status: AC
  Filled 2023-06-26: qty 5

## 2023-06-26 SURGICAL SUPPLY — 81 items
BAG COUNTER SPONGE SURGICOUNT (BAG) ×2 IMPLANT
BAG SPNG CNTER NS LX DISP (BAG) ×1
BIT DRILL QC LG EVOS 3.7 SN (DRILL) IMPLANT
BIT DRILL SURG TARGETER SM 2.5 (DRILL) IMPLANT
BLADE CLIPPER SURG (BLADE) IMPLANT
BNDG CMPR MED 10X6 ELC LF (GAUZE/BANDAGES/DRESSINGS) ×1
BNDG CMPR MED 15X6 ELC VLCR LF (GAUZE/BANDAGES/DRESSINGS) ×1
BNDG ELASTIC 4X5.8 VLCR STR LF (GAUZE/BANDAGES/DRESSINGS) ×2 IMPLANT
BNDG ELASTIC 6X10 VLCR STRL LF (GAUZE/BANDAGES/DRESSINGS) IMPLANT
BNDG ELASTIC 6X15 VLCR STRL LF (GAUZE/BANDAGES/DRESSINGS) IMPLANT
BNDG ELASTIC 6X5.8 VLCR STR LF (GAUZE/BANDAGES/DRESSINGS) ×2 IMPLANT
BNDG GAUZE DERMACEA FLUFF 4 (GAUZE/BANDAGES/DRESSINGS) ×2 IMPLANT
BNDG GZE DERMACEA 4 6PLY (GAUZE/BANDAGES/DRESSINGS) ×1
BRUSH SCRUB EZ PLAIN DRY (MISCELLANEOUS) ×4 IMPLANT
CANISTER SUCT 3000ML PPV (MISCELLANEOUS) ×2 IMPLANT
COVER SURGICAL LIGHT HANDLE (MISCELLANEOUS) ×2 IMPLANT
DRAPE C-ARM 42X72 X-RAY (DRAPES) ×2 IMPLANT
DRAPE C-ARMOR (DRAPES) ×2 IMPLANT
DRAPE IMP U-DRAPE 54X76 (DRAPES) ×2 IMPLANT
DRAPE ORTHO SPLIT 77X108 STRL (DRAPES) ×3
DRAPE SURG ORHT 6 SPLT 77X108 (DRAPES) ×6 IMPLANT
DRAPE U-SHAPE 47X51 STRL (DRAPES) ×2 IMPLANT
DRILL QC LG EVOS 3.7 SN (DRILL) ×3
DRILL SURG TARGETER SM 2.5 (DRILL) ×1
DRSG ADAPTIC 3X8 NADH LF (GAUZE/BANDAGES/DRESSINGS) ×2 IMPLANT
DRSG MEPILEX POST OP 4X12 (GAUZE/BANDAGES/DRESSINGS) IMPLANT
DRSG MEPILEX POST OP 4X8 (GAUZE/BANDAGES/DRESSINGS) IMPLANT
ELECT REM PT RETURN 9FT ADLT (ELECTROSURGICAL) ×1
ELECTRODE REM PT RTRN 9FT ADLT (ELECTROSURGICAL) ×2 IMPLANT
EVACUATOR 1/8 PVC DRAIN (DRAIN) IMPLANT
EVACUATOR 3/16 PVC DRAIN (DRAIN) IMPLANT
GAUZE PAD ABD 8X10 STRL (GAUZE/BANDAGES/DRESSINGS) ×8 IMPLANT
GAUZE SPONGE 4X4 12PLY STRL (GAUZE/BANDAGES/DRESSINGS) ×2 IMPLANT
GLOVE BIO SURGEON STRL SZ7.5 (GLOVE) ×2 IMPLANT
GLOVE BIO SURGEON STRL SZ8 (GLOVE) ×2 IMPLANT
GLOVE BIOGEL PI IND STRL 7.5 (GLOVE) ×2 IMPLANT
GLOVE BIOGEL PI IND STRL 8 (GLOVE) ×2 IMPLANT
GLOVE SURG ORTHO LTX SZ7.5 (GLOVE) ×4 IMPLANT
GOWN STRL REUS W/ TWL LRG LVL3 (GOWN DISPOSABLE) ×4 IMPLANT
GOWN STRL REUS W/ TWL XL LVL3 (GOWN DISPOSABLE) ×2 IMPLANT
GOWN STRL REUS W/TWL LRG LVL3 (GOWN DISPOSABLE) ×2
GOWN STRL REUS W/TWL XL LVL3 (GOWN DISPOSABLE) ×1
K-WIRE FIXATION TT 2X255 (WIRE) ×1
KIT BASIN OR (CUSTOM PROCEDURE TRAY) ×2 IMPLANT
KIT TURNOVER KIT B (KITS) ×2 IMPLANT
KWIRE FIXATION TT 2X255 (WIRE) IMPLANT
NDL 22X1.5 STRL (OR ONLY) (MISCELLANEOUS) IMPLANT
NEEDLE 22X1.5 STRL (OR ONLY) (MISCELLANEOUS) IMPLANT
NS IRRIG 1000ML POUR BTL (IV SOLUTION) ×2 IMPLANT
PACK TOTAL JOINT (CUSTOM PROCEDURE TRAY) ×2 IMPLANT
PACK UNIVERSAL I (CUSTOM PROCEDURE TRAY) ×2 IMPLANT
PAD ARMBOARD 7.5X6 YLW CONV (MISCELLANEOUS) ×4 IMPLANT
PAD CAST 4YDX4 CTTN HI CHSV (CAST SUPPLIES) ×2 IMPLANT
PADDING CAST COTTON 4X4 STRL (CAST SUPPLIES) ×1
PADDING CAST COTTON 6X4 STRL (CAST SUPPLIES) ×2 IMPLANT
PLATE FEM EVOS 3.5X333 16H LT (Plate) IMPLANT
SCREW CORT ST EVOS 3.5X100 (Screw) IMPLANT
SCREW CORT ST EVOS 4.5X18 STL (Screw) IMPLANT
SCREW CORT ST EVOS 4.5X36 (Screw) IMPLANT
SCREW CORT ST EVOS 4.5X38 (Screw) IMPLANT
SCREW CORT ST EVOS 4.5X76 STL (Screw) IMPLANT
SCREW CORT ST EVOS 4.5X80 STL (Screw) IMPLANT
SCREW LOCK ST EVOS 4.5X32 STL (Screw) IMPLANT
SCREW LOCK ST EVOS 4.5X34 STL (Screw) IMPLANT
SCREW LOCK ST EVOS 4.5X36 STL (Screw) IMPLANT
SCREW LOCK ST EVOS 4.5X68 STL (Screw) IMPLANT
SPONGE T-LAP 18X18 ~~LOC~~+RFID (SPONGE) ×2 IMPLANT
STAPLER VISISTAT 35W (STAPLE) ×2 IMPLANT
SUCTION TUBE FRAZIER 10FR DISP (SUCTIONS) ×2 IMPLANT
SUT PROLENE 0 CT 2 (SUTURE) IMPLANT
SUT VIC AB 0 CT1 27 (SUTURE) ×2
SUT VIC AB 0 CT1 27XBRD ANBCTR (SUTURE) ×4 IMPLANT
SUT VIC AB 1 CT1 27 (SUTURE) ×2
SUT VIC AB 1 CT1 27XBRD ANBCTR (SUTURE) ×4 IMPLANT
SUT VIC AB 2-0 CT1 27 (SUTURE) ×2
SUT VIC AB 2-0 CT1 TAPERPNT 27 (SUTURE) ×4 IMPLANT
SYR 20ML ECCENTRIC (SYRINGE) IMPLANT
TOWEL GREEN STERILE (TOWEL DISPOSABLE) ×4 IMPLANT
TOWEL GREEN STERILE FF (TOWEL DISPOSABLE) ×2 IMPLANT
TRAY FOLEY MTR SLVR 16FR STAT (SET/KITS/TRAYS/PACK) IMPLANT
WATER STERILE IRR 1000ML POUR (IV SOLUTION) ×4 IMPLANT

## 2023-06-26 NOTE — Transfer of Care (Signed)
Immediate Anesthesia Transfer of Care Note  Patient: Justin Hurst  Procedure(s) Performed: OPEN REDUCTION INTERNAL FIXATION (ORIF) DISTAL FEMUR FRACTURE (Left)  Patient Location: PACU  Anesthesia Type:GA combined with regional for post-op pain  Level of Consciousness: awake, alert , and oriented  Airway & Oxygen Therapy: Patient Spontanous Breathing and Patient connected to nasal cannula oxygen  Post-op Assessment: Report given to RN and Post -op Vital signs reviewed and stable  Post vital signs: Reviewed and stable  Last Vitals:  Vitals Value Taken Time  BP 178/94 06/26/23 1428  Temp 36.8 C 06/26/23 1426  Pulse 87 06/26/23 1432  Resp 18 06/26/23 1432  SpO2 98 % 06/26/23 1432  Vitals shown include unfiled device data.  Last Pain:  Vitals:   06/26/23 1426  TempSrc:   PainSc: 0-No pain         Complications: No notable events documented.

## 2023-06-26 NOTE — Progress Notes (Signed)
No changes overnight.   The risks and benefits of surgery for left femur periprosthetic fracture repair were discussed with the patient, including the possibility of infection, nerve injury, vessel injury, wound breakdown, arthritis, symptomatic hardware, DVT/ PE, loss of motion, malunion, nonunion, and need for further surgery among others.  These risks were acknowledged and consent provided to proceed.  Myrene Galas, MD [facility], [facility] [phone]

## 2023-06-26 NOTE — Anesthesia Procedure Notes (Signed)
Anesthesia Regional Block: Adductor canal block   Pre-Anesthetic Checklist: , timeout performed,  Correct Patient, Correct Site, Correct Laterality,  Correct Procedure, Correct Position, site marked,  Risks and benefits discussed,  Surgical consent,  Pre-op evaluation,  At surgeon's request and post-op pain management  Laterality: Left  Prep: Dura Prep       Needles:  Injection technique: Single-shot  Needle Type: Echogenic Stimulator Needle     Needle Length: 10cm  Needle Gauge: 20     Additional Needles:   Procedures:,,,, ultrasound used (permanent image in chart),,    Narrative:  Start time: 06/26/2023 10:13 AM End time: 06/26/2023 10:17 AM Injection made incrementally with aspirations every 5 mL.  Performed by: Personally  Anesthesiologist: Atilano Median, DO  Additional Notes: Patient identified. Risks/Benefits/Options discussed with patient including but not limited to bleeding, infection, nerve damage, failed block, incomplete pain control. Patient expressed understanding and wished to proceed. All questions were answered. Sterile technique was used throughout the entire procedure. Please see nursing notes for vital signs. Aspirated in 5cc intervals with injection for negative confirmation. Patient was given instructions on fall risk and not to get out of bed. All questions and concerns addressed with instructions to call with any issues or inadequate analgesia.

## 2023-06-26 NOTE — Anesthesia Preprocedure Evaluation (Addendum)
Anesthesia Evaluation  Patient identified by MRN, date of birth, ID band Patient awake    Reviewed: Allergy & Precautions, NPO status , Patient's Chart, lab work & pertinent test results  Airway Mallampati: II  TM Distance: >3 FB Neck ROM: Full    Dental no notable dental hx.    Pulmonary neg pulmonary ROS   Pulmonary exam normal        Cardiovascular negative cardio ROS  Rhythm:Regular Rate:Normal     Neuro/Psych   Anxiety     negative neurological ROS     GI/Hepatic negative GI ROS, Neg liver ROS,,,  Endo/Other  negative endocrine ROS    Renal/GU negative Renal ROS  negative genitourinary   Musculoskeletal  (+) Arthritis , Osteoarthritis,  Left distal femur fx   Abdominal Normal abdominal exam  (+)   Peds  Hematology negative hematology ROS (+)   Anesthesia Other Findings   Reproductive/Obstetrics                             Anesthesia Physical Anesthesia Plan  ASA: 2  Anesthesia Plan: General   Post-op Pain Management: Regional block*   Induction: Intravenous  PONV Risk Score and Plan: 2 and Ondansetron, Dexamethasone, Midazolam and Treatment may vary due to age or medical condition  Airway Management Planned: Mask and Oral ETT  Additional Equipment: None  Intra-op Plan:   Post-operative Plan: Extubation in OR  Informed Consent: I have reviewed the patients History and Physical, chart, labs and discussed the procedure including the risks, benefits and alternatives for the proposed anesthesia with the patient or authorized representative who has indicated his/her understanding and acceptance.     Dental advisory given  Plan Discussed with: CRNA  Anesthesia Plan Comments:        Anesthesia Quick Evaluation

## 2023-06-26 NOTE — TOC CAGE-AID Note (Signed)
Transition of Care [facility]) - CAGE-AID Screening   Patient Details  Name: Justin Hurst MRN: [id number] Date of Birth: [birth year]  Transition of Care [facility] CM/SW Contact:    Katha Hamming, RN Phone Number: 06/26/2023, 8:04 PM   Clinical Narrative:  Resources offered, pt declined.  CAGE-AID Screening:    Have You Ever Felt You Ought to Cut Down on Your Drinking or Drug Use?: Yes Have People Annoyed You By Critizing Your Drinking Or Drug Use?: Yes Have You Felt Bad Or Guilty About Your Drinking Or Drug Use?: No Have You Ever Had a Drink or Used Drugs First Thing In The Morning to Steady Your Nerves or to Get Rid of a Hangover?: No CAGE-AID Score: 2  Substance Abuse Education Offered: Yes  Substance abuse interventions: Other (must comment) (pt refused)

## 2023-06-26 NOTE — Plan of Care (Signed)

## 2023-06-26 NOTE — Anesthesia Procedure Notes (Signed)
Procedure Name: Intubation Date/Time: 06/26/2023 10:58 AM  Performed by: Marena Chancy, CRNAPre-anesthesia Checklist: Patient identified, Emergency Drugs available, Suction available and Patient being monitored Patient Re-evaluated:Patient Re-evaluated prior to induction Oxygen Delivery Method: Circle System Utilized Preoxygenation: Pre-oxygenation with 100% oxygen Induction Type: IV induction Ventilation: Mask ventilation without difficulty Laryngoscope Size: Mac and 4 Grade View: Grade I Tube type: Oral Tube size: 7.5 mm Number of attempts: 1 Airway Equipment and Method: Stylet and Oral airway Placement Confirmation: ETT inserted through vocal cords under direct vision, positive ETCO2 and breath sounds checked- equal and bilateral Tube secured with: Tape Dental Injury: Teeth and Oropharynx as per pre-operative assessment

## 2023-06-26 NOTE — Op Note (Signed)
06/26/2023  2:53 PM  PATIENT:  Justin Hurst  67 y.o. male  PRE-OPERATIVE DIAGNOSIS:  LEFT PERIPROSTHETIC SUPRACONDYLAR FEMUR FRACTURE  POST-OPERATIVE DIAGNOSIS: LEFT PERIPROSTHETIC SUPRACONDYLAR FEMUR FRACTURE   PROCEDURE:  OPEN REDUCTION INTERNAL FIXATION OF LEFT PERIPROSTHETIC FEMUR FRACTURE WITH SMITH NEPHEW PLATE  SURGEON:  Parish Dubose, MD  PHYSICIAN ASSISTANT: Montez Morita, PA-C  ANESTHESIA:   GENERAL  I/O:  Total I/O In: 1600 [I.V.:1400; IV Piggyback:200] Out: 300 [Urine:250; Blood:50]  SPECIMEN:  No Specimen  TOURNIQUET:  NONE  COMPLICATIONS: NONE  DICTATION: Note written in EPIC  DISPOSITION: TO PACU  CONDITION: STABLE  DELAY START OF DVT PROPHYLAXIS BECAUSE OF BLEEDING RISK: NO  BRIEF SUMMARY OF INDICATION FOR PROCEDURE:  Justin Hurst is a 67 y.o. who sustained a comminuted femur fracture around a long stemmed femoral component and also had evidence of stress fracture in the midshaft area involving the lateral cortex at the proximal end. The risks and benefits of surgery were discussed with the patient and family, including the possibility of infection, nerve injury, vessel injury, wound breakdown, arthritis, symptomatic hardware, DVT/ PE, loss of motion, malunion, nonunion, heart attack, stroke, death, implant loosening, and need for further surgery among others.  These risks were acknowledged and consent provided to proceed.  BRIEF SUMMARY OF PROCEDURE:  The patient was taken to the operating room where general anesthesia was induced and after receipt of preoperative antibiotics.  The left lower extremity was prepped and draped in usual sterile fashion.  No tourniquet was used during the procedure.  A towel bump was placed underneath the femur and towel bumps to restore appropriate alignment and length. C-arm was brought in to confirm the appropriate position of the distal Incision and for placement of the cerclage about 3 cm below the tip of the stem.   An incision was then made.  Dissection was carried down to the IT band in both locations.  It was split in line with the incision.  The deep retractor was placed.  A small area of vastus fibers were spread and the Bennett placed.  My assistant pulled and maintained traction and dialed in the rotation for alignment. I then passed the Arthrex suture fibertape cerclage achieving tensioned compression. I then introduced the [employer] plate.  I placed a pin distally parallel to the femoral component and joint line of the tibial tray and then checked the position on both AP and lateral views proximally, placing a single screw distally and a drill bit through a proximal hole. I did custom bend the plate to facilitate passage of screws up into the femoral head. I then placed multiple screws in the articular block, checking their trajector and alignment with fluoro, followed by additional standard screws proximally and lastly the two 3.5 screws into the femoral head, confirming appropriate position of all screws on orthogonal views. Wounds were irrigated thoroughly and then closed in standard layered fashion using #1 Vicryl for the tensor, 0 Vicryl for the deep subcu, 2-0 Vicryl and 3-0 vertical mattress sutures for the skin.  A sterile gently compressive dressing was then applied with an Ace wrap from foot to thigh as well as a knee immobilizer until the patient wakes up adequately from anesthesia at which time she will be allowed unrestricted range of motion. Montez Morita, PA-C, assisted me throughout and an assistant was necessary to obtain and maintain reduction during provisional and definitive fixation and also assisted with wound closure.   PROGNOSIS:  PT/ OT to assist with  touch down weightbearing and unrestricted range of motion of the knee without bracing.  Formal pharmacologic DVT prophylaxis with Lovenox. F/u in 10-14 days for removal of sutures.     Justin Hurst. Carola Frost, M.D.

## 2023-06-26 NOTE — Progress Notes (Signed)
Justin Hurst PROGRESS NOTE    Justin Hurst  Justin Hurst DOB: 04/30/1956 DOA: 06/25/2023 PCP: [facility]    Brief Narrative:  \ Justin Hurst is a 67 y.o. male with PMH significant for depression, anxiety, left knee replacement x 3 presented  to the hospital after mechanical fall without preceding dizziness or lightheadedness.  He had tripped and fell.  In the ED, he was hemodynamically stable.  Labs were within normal range except for WBC at 14.4.  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  Patient was then considered for admission to the hospital for further evaluation and treatment.  Assessment plan.  Left periprosthetic distal femur fracture. Patient with history of left knee replacement x 3 presented status post fall at home.  Orthopedics on board.  CT scan of the knee on the left showed acute periprosthetic spiral fracture extending from proximal to distal aspect of the left femoral stem prosthesis.  Continue pain control.   NPO for surgical intervention..  Leukocytosis: Likely reactive.  Has normalized at this time.   Anxiety/depression Continue Zoloft and alprazolam.   Elevated blood pressure: Likely due to the pain.  Improved at this time.  On as needed hydralazine    DVT prophylaxis: enoxaparin (LOVENOX) injection 40 mg Start: 06/25/23 1300   Code Status:     Code Status: Full Code  Disposition: Uncertain at this time.  Might need rehabilitation.  Status is: Inpatient Remains inpatient appropriate because: Need for surgical intervention   Family Communication: Spoke with the patient's spouse at bedside  Consultants:  Orthopedics  Procedures:  None yet  Antimicrobials:  Preoperative IV antibiotic x 1  Anti-infectives (From admission, onward)    Start      Dose/Rate Route Frequency Ordered Stop   06/26/23 0600  ceFAZolin (ANCEF) IVPB 2g/100 mL premix        2 g 200 mL/hr over 30 Minutes Intravenous On call to O.R. 06/25/23 1646 06/27/23 0559        Subjective: Today, patient was seen and examined at bedside.  Complains of mild knee pain.  Family at bedside  Objective: Vitals:   06/25/23 2342 06/26/23 0451 06/26/23 0730 06/26/23 1004  BP: (!) 151/92 130/81 113/78 (!) 135/93  Pulse: 92 80 77 78  Resp:  18 18 20   Temp:  98.4 F (36.9 C) 98.2 F (36.8 C) 98.1 F (36.7 C)  TempSrc:  Oral Oral Oral  SpO2: 93% 96% 96% 92%  Weight:      Height:        Intake/Output Summary (Last 24 hours) at 06/26/2023 1018 Last data filed at 06/26/2023 0300 Gross per 24 hour  Intake 600 ml  Output --  Net 600 ml   Filed Weights   06/25/23 0748  Weight: 83.9 kg    Physical Examination: Body mass index is 29.86 kg/m.  General:  Average built, not in obvious distress HENT:   No scleral pallor or icterus noted. Oral mucosa is moist.  Chest:  Diminished breath sounds bilaterally. No crackles or wheezes.  CVS: S1 &S2 heard. No murmur.  Regular rate and rhythm. Abdomen: Soft, nontender, nondistended.  Bowel sounds are heard.   Extremities: No cyanosis, clubbing or edema.  Left lower extremity externally rotated with knee scar.  Tenderness over the lower femoral region. Psych: Alert, awake and oriented,  normal mood CNS:  No cranial nerve deficits.  Power equal in all extremities.   Skin: Warm and dry.  No rashes noted.  Data Reviewed:   CBC: Recent Labs  Lab 06/25/23 0750 06/25/23 1702 06/26/23 0516  WBC 14.5* 10.5 8.7  NEUTROABS 12.5*  --   --   HGB 14.5 14.5 13.1  HCT 43.6 43.1 39.8  MCV 94.6 91.5 92.8  PLT 252 250 218    Basic Metabolic Panel: Recent Labs  Lab 06/25/23 0750 06/25/23 1702 06/26/23 0516  NA 137  --  135  K 4.1  --  3.5  CL 104  --  100  CO2 25  --  26  GLUCOSE 111*  --  102*  BUN 22  --  25*  CREATININE  0.88 1.01 0.96  CALCIUM 8.8*  --  8.7*  MG  --   --  2.1  PHOS  --   --  2.7    Liver Function Tests: Recent Labs  Lab 06/26/23 0516  AST 24  ALT 27  ALKPHOS 91  BILITOT 0.8  PROT 6.7  ALBUMIN 3.4*     Radiology Studies: CT Knee Left Wo Contrast  Result Date: 06/25/2023 CLINICAL DATA:  Knee trauma.  Fall. EXAM: CT OF THE LOWER LEFT EXTREMITY WITHOUT CONTRAST TECHNIQUE: Multidetector CT imaging of the lower left extremity was performed according to the standard protocol. RADIATION DOSE REDUCTION: This exam was performed according to the departmental dose-optimization program which includes automated exposure control, adjustment of the mA and/or kV according to patient size and/or use of iterative reconstruction technique. COMPARISON:  Same day left femur radiographs at 8:41 a.m. FINDINGS: Bones/Joint/Cartilage Postsurgical changes related to left total knee arthroplasty with long femoral stem component. There is an acute periprosthetic spiral fracture extending from the proximal through the distal femoral stem component. The proximal margin of the fracture extends through the lateral mid femoral cortical diaphysis at the level of the proximal tip of the femoral stem component with approximately 6 mm of diastasis. Additional cortical breakthrough is noted along the medial mid femoral diaphyseal cortex as well as the lateral distal metadiaphyseal femoral cortex, at the level of the most proximal cerclage wire. There is approximately 4 mm of cortical step-off along the posteromedial mid femoral diaphysis. Four distal metaphyseal cerclage wires are intact. There is essentially near complete loss of cortical bone overlying the anteromedial aspect of the distal femoral stem component extending to the level of the proximal femoral condylar prosthetic component, measuring approximately 6 cm in craniocaudal dimension (knee series 5, image 63). There is periprosthetic lucency surrounding the deep aspect  of the tibial tray prosthesis component, most pronounced anteriorly and medially (knee series 5, image 60). No additional fracture identified. There is a knee joint effusion. 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 Ligaments Suboptimally assessed by CT. Soft tissues/Muscles There is mild soft tissue swelling overlying the distal femur. Edema is noted within the quadriceps musculature surrounding the fracture, described above. No intramuscular collection. IMPRESSION: 1. Left total knee arthroplasty with acute periprosthetic spiral fracture extending from the proximal to the distal aspects of the left femoral stem prosthesis component. There is mild diastasis proximally and cortical step-off at the posterior mid femoral diaphysis. 2. Near complete loss of cortical bone overlying the anteromedial aspect of the distal femoral stem component. 3. Periprosthetic lucency surrounding the deep aspect of the tibial prosthesis component, most pronounced anteriorly and medially. These findings are concerning for loosening. Electronically Signed  By: Hart Robinsons M.D.   On: 06/25/2023 16:20   CT FEMUR LEFT WO CONTRAST  Result Date: 06/25/2023 CLINICAL DATA:  Knee trauma.  Fall. EXAM: CT OF THE LOWER LEFT EXTREMITY WITHOUT CONTRAST TECHNIQUE: Multidetector CT imaging of the lower left extremity was performed according to the standard protocol. RADIATION DOSE REDUCTION: This exam was performed according to the departmental dose-optimization program which includes automated exposure control, adjustment of the mA and/or kV according to patient size and/or use of iterative reconstruction technique. COMPARISON:  Same day left femur radiographs at 8:41 a.m. FINDINGS: Bones/Joint/Cartilage Postsurgical changes related to left total knee arthroplasty with long femoral stem component. There is an acute periprosthetic spiral fracture extending from the proximal through the distal femoral stem component. The  proximal margin of the fracture extends through the lateral mid femoral cortical diaphysis at the level of the proximal tip of the femoral stem component with approximately 6 mm of diastasis. Additional cortical breakthrough is noted along the medial mid femoral diaphyseal cortex as well as the lateral distal metadiaphyseal femoral cortex, at the level of the most proximal cerclage wire. There is approximately 4 mm of cortical step-off along the posteromedial mid femoral diaphysis. Four distal metaphyseal cerclage wires are intact. There is essentially near complete loss of cortical bone overlying the anteromedial aspect of the distal femoral stem component extending to the level of the proximal femoral condylar prosthetic component, measuring approximately 6 cm in craniocaudal dimension (knee series 5, image 63). There is periprosthetic lucency surrounding the deep aspect of the tibial tray prosthesis component, most pronounced anteriorly and medially (knee series 5, image 60). No additional fracture identified. There is a knee joint effusion. 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 Ligaments Suboptimally assessed by CT. Soft tissues/Muscles There is mild soft tissue swelling overlying the distal femur. Edema is noted within the quadriceps musculature surrounding the fracture, described above. No intramuscular collection. IMPRESSION: 1. Left total knee arthroplasty with acute periprosthetic spiral fracture extending from the proximal to the distal aspects of the left femoral stem prosthesis component. There is mild diastasis proximally and cortical step-off at the posterior mid femoral diaphysis. 2. Near complete loss of cortical bone overlying the anteromedial aspect of the distal femoral stem component. 3. Periprosthetic lucency surrounding the deep aspect of the tibial prosthesis component, most pronounced anteriorly and medially. These findings are concerning for loosening. Electronically  Signed   By: Hart Robinsons M.D.   On: 06/25/2023 16:20   DG FEMUR MIN 2 VIEWS LEFT  Result Date: 06/25/2023 CLINICAL DATA:  Fall.  Left hip pain. EXAM: LEFT FEMUR 2 VIEWS; PORTABLE PELVIS 1-2 VIEWS COMPARISON:  Left knee radiographs 04/05/2023 FINDINGS: Pelvis: Mildly decreased bone mineralization. Mild pubic symphysis joint space narrowing with mild subchondral sclerosis and mild superior osteophytosis. Mild-to-moderate left and mild right superomedial femoroacetabular joint space narrowing. Moderate left L3-4 and mild left L4-5 disc space narrowing with moderate left L3-4 and L4-5 left endplate osteophytes. Left femur: Postsurgical changes are seen of total left knee arthroplasty with long femoral stem component, similar to 06/25/2023. There is an acute periprosthetic spiral fracture around the proximal, mid, and distal portions of the left femoral stem prosthesis component. This fracture extends through the lateral cortex of the mid femoral diaphysis just lateral to the proximal tip of the femoral stem with approximately 5 mm cortical step-off. There is up to approximately 5 mm AP dimension diastasis of a more distal spiral fracture. There are again multiple distal femoral metadiaphyseal cerclage  wires. There is again high-grade bone thinning at the distal medial aspect of the femoral prosthesis, greatest at the metaphysis. There are again partially visualized lucencies around the deep aspect of the tibial tray prosthesis component. IMPRESSION: Redemonstration of total left knee arthroplasty with long femoral stem component. Acute periprosthetic spiral fracture around the proximal, mid, and distal portions of the left femoral stem prosthesis component. Electronically Signed   By: Neita Garnet M.D.   On: 06/25/2023 09:30   DG Pelvis Portable  Result Date: 06/25/2023 CLINICAL DATA:  Fall.  Left hip pain. EXAM: LEFT FEMUR 2 VIEWS; PORTABLE PELVIS 1-2 VIEWS COMPARISON:  Left knee radiographs 04/05/2023  FINDINGS: Pelvis: Mildly decreased bone mineralization. Mild pubic symphysis joint space narrowing with mild subchondral sclerosis and mild superior osteophytosis. Mild-to-moderate left and mild right superomedial femoroacetabular joint space narrowing. Moderate left L3-4 and mild left L4-5 disc space narrowing with moderate left L3-4 and L4-5 left endplate osteophytes. Left femur: Postsurgical changes are seen of total left knee arthroplasty with long femoral stem component, similar to 06/25/2023. There is an acute periprosthetic spiral fracture around the proximal, mid, and distal portions of the left femoral stem prosthesis component. This fracture extends through the lateral cortex of the mid femoral diaphysis just lateral to the proximal tip of the femoral stem with approximately 5 mm cortical step-off. There is up to approximately 5 mm AP dimension diastasis of a more distal spiral fracture. There are again multiple distal femoral metadiaphyseal cerclage wires. There is again high-grade bone thinning at the distal medial aspect of the femoral prosthesis, greatest at the metaphysis. There are again partially visualized lucencies around the deep aspect of the tibial tray prosthesis component. IMPRESSION: Redemonstration of total left knee arthroplasty with long femoral stem component. Acute periprosthetic spiral fracture around the proximal, mid, and distal portions of the left femoral stem prosthesis component. Electronically Signed   By: Neita Garnet M.D.   On: 06/25/2023 09:30      LOS: 1 day    Joycelyn Das, MD [facility] Available via Epic secure chat 7am-7pm After these hours, please refer to coverage provider listed on [email or web address] 06/26/2023, 10:18 AM

## 2023-06-27 ENCOUNTER — Encounter (HOSPITAL_COMMUNITY): Payer: Self-pay | Admitting: Family Medicine

## 2023-06-27 ENCOUNTER — Inpatient Hospital Stay (HOSPITAL_COMMUNITY): Payer: Medicare Other

## 2023-06-27 DIAGNOSIS — G894 Chronic pain syndrome: Secondary | ICD-10-CM | POA: Diagnosis not present

## 2023-06-27 DIAGNOSIS — M1711 Unilateral primary osteoarthritis, right knee: Secondary | ICD-10-CM

## 2023-06-27 DIAGNOSIS — S7225XA Nondisplaced subtrochanteric fracture of left femur, initial encounter for closed fracture: Secondary | ICD-10-CM | POA: Diagnosis not present

## 2023-06-27 DIAGNOSIS — M9712XA 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, initial encounter: Secondary | ICD-10-CM | POA: Diagnosis not present

## 2023-06-27 HISTORY — DX: Unilateral primary osteoarthritis, right knee: M17.11

## 2023-06-27 LAB — CBC
HCT: 34.5 % — ABNORMAL LOW (ref 39.0–52.0)
Hemoglobin: 11.5 g/dL — ABNORMAL LOW (ref 13.0–17.0)
MCH: 31.1 pg (ref 26.0–34.0)
MCHC: 33.3 g/dL (ref 30.0–36.0)
MCV: 93.2 fL (ref 80.0–100.0)
Platelets: 209 10*3/uL (ref 150–400)
RBC: 3.7 MIL/uL — ABNORMAL LOW (ref 4.22–5.81)
RDW: 13.6 % (ref 11.5–15.5)
WBC: 9.2 10*3/uL (ref 4.0–10.5)
nRBC: 0 % (ref 0.0–0.2)

## 2023-06-27 LAB — BASIC METABOLIC PANEL
Anion gap: 8 (ref 5–15)
BUN: 15 mg/dL (ref 8–23)
CO2: 26 mmol/L (ref 22–32)
Calcium: 8.3 mg/dL — ABNORMAL LOW (ref 8.9–10.3)
Chloride: 101 mmol/L (ref 98–111)
Creatinine, Ser: 0.99 mg/dL (ref 0.61–1.24)
GFR, Estimated: >60 mL/min (ref >60)
Glucose, Bld: 119 mg/dL — ABNORMAL HIGH (ref 70–99)
Potassium: 3.5 mmol/L (ref 3.5–5.1)
Sodium: 135 mmol/L (ref 135–145)

## 2023-06-27 LAB — MAGNESIUM: Magnesium: 2.2 mg/dL (ref 1.7–2.4)

## 2023-06-27 LAB — VITAMIN D 25 HYDROXY (VIT D DEFICIENCY, FRACTURES): Vit D, 25-Hydroxy: 11.67 ng/mL — ABNORMAL LOW (ref 30–100)

## 2023-06-27 NOTE — Progress Notes (Signed)
Inpatient Rehab Admissions Coordinator:   Consult received and chart reviewed.  Note therapy recommendations for SNF.  Unfortunately, [facility] Medicare will not approve CIR for a pt with a diagnosis of hip fracture.  Will sign off.   Estill Dooms, PT, DPT Admissions Coordinator [phone] 06/27/23  3:14 PM

## 2023-06-27 NOTE — Plan of Care (Signed)

## 2023-06-27 NOTE — Plan of Care (Signed)

## 2023-06-27 NOTE — Progress Notes (Signed)
Orthopedic Tech Progress Note Patient Details:  [name] 1956-02-11 [id number]  Patient ID: Justin Hurst, male   DOB: [birth year], 67 y.o.   MRN: [id number] [employer] contacted for outside vendor brace order. Darleen Crocker 06/27/2023, 2:06 PM

## 2023-06-27 NOTE — Anesthesia Postprocedure Evaluation (Signed)
Anesthesia Post Note  Patient: Justin Hurst  Procedure(s) Performed: OPEN REDUCTION INTERNAL FIXATION (ORIF) DISTAL FEMUR FRACTURE (Left)     Patient location during evaluation: PACU Anesthesia Type: General and Regional Level of consciousness: awake and alert Pain management: pain level controlled Vital Signs Assessment: post-procedure vital signs reviewed and stable Respiratory status: spontaneous breathing, nonlabored ventilation, respiratory function stable and patient connected to nasal cannula oxygen Cardiovascular status: blood pressure returned to baseline and stable Postop Assessment: no apparent nausea or vomiting Anesthetic complications: no   No notable events documented.  Last Vitals:  Vitals:   06/27/23 0449 06/27/23 0557  BP: 114/76 107/81  Pulse: 67 65  Resp: 18   Temp: 36.5 C 36.8 C  SpO2: 96% 98%    Last Pain:  Vitals:   06/27/23 0557  TempSrc: Oral  PainSc:                  Nelle Don Pamelia Botto

## 2023-06-27 NOTE — Evaluation (Signed)
Occupational Therapy Evaluation Patient Details Name: Justin Hurst MRN: [id number] DOB: 01-07-1956 Today's Date: 06/27/2023   History of Present Illness 67 yo male admitted 06/25/23 s/p fall with Lt femur fx s/p ORIF 10/3. PMhx: Lt TKA x 3, bil ankle fusions, depression, anxiety, chronic pain, lt femur fx with ORIF 2019   Clinical Impression   Pt was independent prior to admission. Presents with L LE and generalized weakness with inability to maintain TDWB with standing. Pt requires set up to total assist for ADLs. Began educating in ADLs from a sitting position, leaning side to side, option of using AE for LB bathing and dressing, use of drop arm commode and benefits of w/c until weight bearing precautions are lifted. Main floor of pt's home does not currently have an accessible bathroom (primary bathroom is under renovation), therefore pt is considering staying on lower level with full bathroom. Per chart, pt's insurance will not approve AIR, therefore patient will benefit from continued inpatient follow up therapy, <3 hours/day.       If plan is discharge home, recommend the following: Two people to help with walking and/or transfers;A lot of help with bathing/dressing/bathroom;Assistance with cooking/housework;Assist for transportation;Help with stairs or ramp for entrance    Functional Status Assessment  Patient has had a recent decline in their functional status and demonstrates the ability to make significant improvements in function in a reasonable and predictable amount of time.  Equipment Recommendations  Other (comment);Wheelchair (measurements OT);Wheelchair cushion (measurements OT) (drop arm commode)    Recommendations for Other Services       Precautions / Restrictions Precautions Precautions: Fall Required Braces or Orthoses: Knee Immobilizer - Left (ordered from outside vendor, not received) Restrictions Weight Bearing Restrictions: Yes LLE Weight Bearing: Touchdown  weight bearing      Mobility Bed Mobility Overal bed mobility: Needs Assistance Bed Mobility: Supine to Sit, Sit to Supine     Supine to sit: Min assist, HOB elevated, Used rails Sit to supine: Min assist        Transfers                   General transfer comment: pt unable to maintain TDWB due to other orthopedic problems, focus on lateral transfers      Balance Overall balance assessment: Needs assistance Sitting-balance support: No upper extremity supported, Feet supported Sitting balance-Leahy Scale: Fair                                     ADL either performed or assessed with clinical judgement   ADL Overall ADL's : Needs assistance/impaired Eating/Feeding: Independent;Sitting;Bed level   Grooming: Set up;Sitting;Bed level   Upper Body Bathing: Minimal assistance;Sitting   Lower Body Bathing: Total assistance;Sitting/lateral leans   Upper Body Dressing : Minimal assistance;Sitting   Lower Body Dressing: Total assistance;Sitting/lateral leans                 General ADL Comments: Educated pt in leaning side to side for LB bathing/dressing and pericare.     Vision Ability to See in Adequate Light: 0 Adequate Patient Visual Report: No change from baseline       Perception         Praxis         Pertinent Vitals/Pain Pain Assessment Pain Assessment: Faces Faces Pain Scale: Hurts a little bit Pain Location: LLE Pain Descriptors / Indicators: Discomfort Pain Intervention(s):  Limited activity within patient's tolerance, Monitored during session     Extremity/Trunk Assessment Upper Extremity Assessment Upper Extremity Assessment: Overall WFL for tasks assessed   Lower Extremity Assessment Lower Extremity Assessment: Defer to PT evaluation   Cervical / Trunk Assessment Cervical / Trunk Assessment: Normal   Communication Communication Communication: No apparent difficulties   Cognition Arousal: Alert Behavior  During Therapy: WFL for tasks assessed/performed Overall Cognitive Status: Within Functional Limits for tasks assessed                                       General Comments       Exercises     Shoulder Instructions      Home Living Family/patient expects to be discharged to:: Private residence Living Arrangements: Spouse/significant other Available Help at Discharge: Family;Available PRN/intermittently Type of Home: House Home Access: Stairs to enter Entrance Stairs-Number of Steps: 5   Home Layout: Two level;Able to live on main level with bedroom/bathroom Alternate Level Stairs-Number of Steps: one bathroom on main level is being renovated, second bath is inaccessible, pt considering staying in basement   Bathroom Shower/Tub: Producer, television/film/video: Standard     Home Equipment: Agricultural consultant (2 wheels);Cane - single point;Crutches;Shower seat          Prior Functioning/Environment Prior Level of Function : Independent/Modified Independent;Driving                        OT Problem List: Pain;Impaired balance (sitting and/or standing);Decreased knowledge of precautions      OT Treatment/Interventions: Self-care/ADL training;DME and/or AE instruction;Therapeutic activities;Patient/family education;Balance training    OT Goals(Current goals can be found in the care plan section) Acute Rehab OT Goals OT Goal Formulation: With patient Time For Goal Achievement: 07/11/23 Potential to Achieve Goals: Good ADL Goals Pt Will Perform Lower Body Bathing: with set-up;with adaptive equipment;sitting/lateral leans Pt Will Perform Lower Body Dressing: with set-up;sitting/lateral leans;with adaptive equipment Pt Will Transfer to Toilet: with min assist;with transfer board;bedside commode Pt Will Perform Toileting - Clothing Manipulation and hygiene: with set-up;sitting/lateral leans Additional ADL Goal #1: Pt will complete bed mobility mod I in  preparation for ADLs.  OT Frequency: Min 1X/week    Co-evaluation              AM-PAC OT "6 Clicks" Daily Activity     Outcome Measure Help from another person eating meals?: None Help from another person taking care of personal grooming?: A Little Help from another person toileting, which includes using toliet, bedpan, or urinal?: Total Help from another person bathing (including washing, rinsing, drying)?: A Lot Help from another person to put on and taking off regular upper body clothing?: A Little Help from another person to put on and taking off regular lower body clothing?: Total 6 Click Score: 14   End of Session    Activity Tolerance: Patient tolerated treatment well Patient left: in bed;with call bell/phone within reach;with family/visitor present  OT Visit Diagnosis: Pain;Muscle weakness (generalized) (M62.81)                Time: 1405-1440 OT Time Calculation (min): 35 min Charges:  OT General Charges $OT Visit: 1 Visit OT Evaluation $OT Eval Moderate Complexity: 1 Mod OT Treatments $Self Care/Home Management : 8-22 mins  Berna Spare, OTR/L Acute Rehabilitation Services Office: [phone]   Evern Bio 06/27/2023, 3:28  PM

## 2023-06-27 NOTE — Progress Notes (Signed)
[facility] PROGRESS NOTE    Justin Hurst  ZOX:[id number] DOB: 04/02/1956 DOA: 06/25/2023 PCP: [facility]    Brief Narrative:   Justin Hurst is a 67 y.o. male with PMH significant for depression, anxiety, left knee replacement x 3 presented  to the hospital after mechanical fall without preceding dizziness or lightheadedness.  He had tripped and fell.  In the ED, he was hemodynamically stable.  Labs were within normal range except for WBC at 14.4.  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  Patient was then considered for admission to the hospital for further evaluation and treatment.  Assessment plan.  Left periprosthetic distal femur fracture status post ORIF with plate insertion on 06/26/2023. Patient with history of left knee replacement x 3 presented status post fall at home.  CT scan of the knee on the left showed acute periprosthetic spiral fracture extending from proximal to distal aspect of the left femoral stem prosthesis.  Orthopedics on board and plan is touchdown weightbearing and unrestricted range of motion of the knee without patient.  Full pharmacological prophylaxis.  Follow-up in 10 to 14 days for removal of sutures.  PT OT has assessed the patient today and has recommended skilled nursing facility.  Will need to focus on adequate pain management for rehabilitation.  Leukocytosis: Likely reactive.  CBC normalized at this time.  Hemoglobin of 11.5.   Anxiety/depression Continue Zoloft and alprazolam.   Elevated blood pressure: Likely due to the pain.  Improved at this time.  On as needed hydralazine.  Focus on good pain management.    DVT prophylaxis: SCDs Start: 06/26/23 1547 enoxaparin (LOVENOX) injection 40 mg Start: 06/25/23 1300   Code Status:     Code Status:  Full Code  Disposition: PT has recommended skilled nursing facility placement at this time.  Status is: Inpatient  Remains inpatient appropriate because: Status post surgical intervention.   Family Communication: Spoke with the patient's spouse at bedside on 06/27/2023  Consultants:  Orthopedics  Procedures:  ORIF with plate insertion 06/26/2023  Antimicrobials:  Preoperative IV antibiotic x 1  Anti-infectives (From admission, onward)    Start     Dose/Rate Route Frequency Ordered Stop   06/26/23 1700  ceFAZolin (ANCEF) IVPB 2g/100 mL premix        2 g 200 mL/hr over 30 Minutes Intravenous Every 6 hours 06/26/23 1546 06/26/23 2342   06/26/23 0600  ceFAZolin (ANCEF) IVPB 2g/100 mL premix        2 g 200 mL/hr over 30 Minutes Intravenous On call to O.R. 06/25/23 1646 06/26/23 1115       Subjective: Today, patient was seen and examined at bedside.  Had some surgical pain.  Denies any nausea vomiting fever shortness of breath chest pain.  Objective: Vitals:   06/26/23 2343 06/27/23 0022 06/27/23 0449 06/27/23 0557  BP: (!) 126/92 124/79 114/76 107/81  Pulse: 84 77 67 65  Resp:  18 18   Temp:  98.1 F (36.7 C) 97.7 F (36.5 C) 98.2 F (36.8 C)  TempSrc:  Oral Oral Oral  SpO2: 95% 92% 96% 98%  Weight:      Height:        Intake/Output Summary (Last 24 hours) at 06/27/2023 0751 Last data filed at 06/27/2023 0644 Gross per 24 hour  Intake 2538.67 ml  Output 3050 ml  Net -511.33 ml   Filed Weights   06/25/23 0748  Weight: 83.9 kg    Physical Examination: Body mass index is 29.86 kg/m.   General:  Average built, not in obvious distress HENT:   No scleral pallor or icterus noted. Oral mucosa is moist.  Chest: Clear breath sounds bilaterally no crackles or wheezes.  CVS: S1 &S2 heard. No murmur.  Regular rate and rhythm. Abdomen: Soft, nontender, nondistended.  Bowel sounds are heard.   Extremities: No cyanosis, clubbing or edema.  Left lower extremity status  post ORIF. Psych: Alert, awake and oriented, normal mood CNS:  No cranial nerve deficits.  Power equal in all extremities.   Skin: Warm and dry.  No rashes noted.  Data Reviewed:   CBC: Recent Labs  Lab 06/25/23 0750 06/25/23 1702 06/26/23 0516 06/27/23 0618  WBC 14.5* 10.5 8.7 9.2  NEUTROABS 12.5*  --   --   --   HGB 14.5 14.5 13.1 11.5*  HCT 43.6 43.1 39.8 34.5*  MCV 94.6 91.5 92.8 93.2  PLT 252 250 218 209    Basic Metabolic Panel: Recent Labs  Lab 06/25/23 0750 06/25/23 1702 06/26/23 0516 06/27/23 0618  NA 137  --  135 135  K 4.1  --  3.5 3.5  CL 104  --  100 101  CO2 25  --  26 26  GLUCOSE 111*  --  102* 119*  BUN 22  --  25* 15  CREATININE 0.88 1.01 0.96 0.99  CALCIUM 8.8*  --  8.7* 8.3*  MG  --   --  2.1 2.2  PHOS  --   --  2.7  --     Liver Function Tests: Recent Labs  Lab 06/26/23 0516  AST 24  ALT 27  ALKPHOS 91  BILITOT 0.8  PROT 6.7  ALBUMIN 3.4*     Radiology Studies: DG FEMUR PORT MIN 2 VIEWS LEFT  Result Date: 06/26/2023 CLINICAL DATA:  Fracture, postop. EXAM: LEFT FEMUR PORTABLE 2 VIEWS COMPARISON:  Preoperative imaging. FINDINGS: Lateral plate and multi screw fixation of femoral shaft fracture. Previous revision knee arthroplasty with cerclage wire fixation. Recent postsurgical change includes air and edema in the soft tissues. IMPRESSION: ORIF of femoral shaft fracture without immediate postoperative complication. Electronically Signed   By: Narda Rutherford M.D.   On: 06/26/2023 17:20   DG FEMUR MIN 2 VIEWS LEFT  Result Date: 06/26/2023 CLINICAL DATA:  Elective surgery. EXAM: LEFT FEMUR 2 VIEWS COMPARISON:  Preoperative radiographs. FINDINGS: Twenty-one fluoroscopic spot views of the left femur obtained in the operating room. Sequential images during lateral plate and screw fixation of periprosthetic femur fracture. Fluoroscopy time 1 minutes 53 seconds. Dose 34.8 mGy. IMPRESSION: Intraoperative fluoroscopy during periprosthetic femur  fracture fixation. Electronically Signed   By: Narda Rutherford M.D.   On: 06/26/2023 16:35   DG C-Arm 1-60 Min-No Report  Result Date: 06/26/2023 Fluoroscopy was utilized by the requesting physician.  No radiographic interpretation.   DG C-Arm 1-60 Min-No Report  Result Date: 06/26/2023 Fluoroscopy was utilized by the requesting physician.  No radiographic interpretation.   DG C-Arm 1-60 Min-No Report  Result Date: 06/26/2023 Fluoroscopy was utilized by the requesting physician.  No radiographic interpretation.   CT Knee Left Wo Contrast  Result Date: 06/25/2023 CLINICAL DATA:  Knee trauma.  Fall. EXAM: CT OF THE LOWER LEFT EXTREMITY WITHOUT CONTRAST TECHNIQUE: Multidetector CT imaging of the lower left extremity was performed according to the standard protocol.  RADIATION DOSE REDUCTION: This exam was performed according to the departmental dose-optimization program which includes automated exposure control, adjustment of the mA and/or kV according to patient size and/or use of iterative reconstruction technique. COMPARISON:  Same day left femur radiographs at 8:41 a.m. FINDINGS: Bones/Joint/Cartilage Postsurgical changes related to left total knee arthroplasty with long femoral stem component. There is an acute periprosthetic spiral fracture extending from the proximal through the distal femoral stem component. The proximal margin of the fracture extends through the lateral mid femoral cortical diaphysis at the level of the proximal tip of the femoral stem component with approximately 6 mm of diastasis. Additional cortical breakthrough is noted along the medial mid femoral diaphyseal cortex as well as the lateral distal metadiaphyseal femoral cortex, at the level of the most proximal cerclage wire. There is approximately 4 mm of cortical step-off along the posteromedial mid femoral diaphysis. Four distal metaphyseal cerclage wires are intact. There is essentially near complete loss of cortical bone  overlying the anteromedial aspect of the distal femoral stem component extending to the level of the proximal femoral condylar prosthetic component, measuring approximately 6 cm in craniocaudal dimension (knee series 5, image 63). There is periprosthetic lucency surrounding the deep aspect of the tibial tray prosthesis component, most pronounced anteriorly and medially (knee series 5, image 60). No additional fracture identified. There is a knee joint effusion. 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 Ligaments Suboptimally assessed by CT. Soft tissues/Muscles There is mild soft tissue swelling overlying the distal femur. Edema is noted within the quadriceps musculature surrounding the fracture, described above. No intramuscular collection. IMPRESSION: 1. Left total knee arthroplasty with acute periprosthetic spiral fracture extending from the proximal to the distal aspects of the left femoral stem prosthesis component. There is mild diastasis proximally and cortical step-off at the posterior mid femoral diaphysis. 2. Near complete loss of cortical bone overlying the anteromedial aspect of the distal femoral stem component. 3. Periprosthetic lucency surrounding the deep aspect of the tibial prosthesis component, most pronounced anteriorly and medially. These findings are concerning for loosening. Electronically Signed   By: Hart Robinsons M.D.   On: 06/25/2023 16:20   CT FEMUR LEFT WO CONTRAST  Result Date: 06/25/2023 CLINICAL DATA:  Knee trauma.  Fall. EXAM: CT OF THE LOWER LEFT EXTREMITY WITHOUT CONTRAST TECHNIQUE: Multidetector CT imaging of the lower left extremity was performed according to the standard protocol. RADIATION DOSE REDUCTION: This exam was performed according to the departmental dose-optimization program which includes automated exposure control, adjustment of the mA and/or kV according to patient size and/or use of iterative reconstruction technique. COMPARISON:  Same day  left femur radiographs at 8:41 a.m. FINDINGS: Bones/Joint/Cartilage Postsurgical changes related to left total knee arthroplasty with long femoral stem component. There is an acute periprosthetic spiral fracture extending from the proximal through the distal femoral stem component. The proximal margin of the fracture extends through the lateral mid femoral cortical diaphysis at the level of the proximal tip of the femoral stem component with approximately 6 mm of diastasis. Additional cortical breakthrough is noted along the medial mid femoral diaphyseal cortex as well as the lateral distal metadiaphyseal femoral cortex, at the level of the most proximal cerclage wire. There is approximately 4 mm of cortical step-off along the posteromedial mid femoral diaphysis. Four distal metaphyseal cerclage wires are intact. There is essentially near complete loss of cortical bone overlying the anteromedial aspect of the distal femoral stem component extending to the level of the proximal femoral  condylar prosthetic component, measuring approximately 6 cm in craniocaudal dimension (knee series 5, image 63). There is periprosthetic lucency surrounding the deep aspect of the tibial tray prosthesis component, most pronounced anteriorly and medially (knee series 5, image 60). No additional fracture identified. There is a knee joint effusion. 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 Ligaments Suboptimally assessed by CT. Soft tissues/Muscles There is mild soft tissue swelling overlying the distal femur. Edema is noted within the quadriceps musculature surrounding the fracture, described above. No intramuscular collection. IMPRESSION: 1. Left total knee arthroplasty with acute periprosthetic spiral fracture extending from the proximal to the distal aspects of the left femoral stem prosthesis component. There is mild diastasis proximally and cortical step-off at the posterior mid femoral diaphysis. 2. Near complete  loss of cortical bone overlying the anteromedial aspect of the distal femoral stem component. 3. Periprosthetic lucency surrounding the deep aspect of the tibial prosthesis component, most pronounced anteriorly and medially. These findings are concerning for loosening. Electronically Signed   By: Hart Robinsons M.D.   On: 06/25/2023 16:20   DG FEMUR MIN 2 VIEWS LEFT  Result Date: 06/25/2023 CLINICAL DATA:  Fall.  Left hip pain. EXAM: LEFT FEMUR 2 VIEWS; PORTABLE PELVIS 1-2 VIEWS COMPARISON:  Left knee radiographs 04/05/2023 FINDINGS: Pelvis: Mildly decreased bone mineralization. Mild pubic symphysis joint space narrowing with mild subchondral sclerosis and mild superior osteophytosis. Mild-to-moderate left and mild right superomedial femoroacetabular joint space narrowing. Moderate left L3-4 and mild left L4-5 disc space narrowing with moderate left L3-4 and L4-5 left endplate osteophytes. Left femur: Postsurgical changes are seen of total left knee arthroplasty with long femoral stem component, similar to 06/25/2023. There is an acute periprosthetic spiral fracture around the proximal, mid, and distal portions of the left femoral stem prosthesis component. This fracture extends through the lateral cortex of the mid femoral diaphysis just lateral to the proximal tip of the femoral stem with approximately 5 mm cortical step-off. There is up to approximately 5 mm AP dimension diastasis of a more distal spiral fracture. There are again multiple distal femoral metadiaphyseal cerclage wires. There is again high-grade bone thinning at the distal medial aspect of the femoral prosthesis, greatest at the metaphysis. There are again partially visualized lucencies around the deep aspect of the tibial tray prosthesis component. IMPRESSION: Redemonstration of total left knee arthroplasty with long femoral stem component. Acute periprosthetic spiral fracture around the proximal, mid, and distal portions of the left femoral  stem prosthesis component. Electronically Signed   By: Neita Garnet M.D.   On: 06/25/2023 09:30   DG Pelvis Portable  Result Date: 06/25/2023 CLINICAL DATA:  Fall.  Left hip pain. EXAM: LEFT FEMUR 2 VIEWS; PORTABLE PELVIS 1-2 VIEWS COMPARISON:  Left knee radiographs 04/05/2023 FINDINGS: Pelvis: Mildly decreased bone mineralization. Mild pubic symphysis joint space narrowing with mild subchondral sclerosis and mild superior osteophytosis. Mild-to-moderate left and mild right superomedial femoroacetabular joint space narrowing. Moderate left L3-4 and mild left L4-5 disc space narrowing with moderate left L3-4 and L4-5 left endplate osteophytes. Left femur: Postsurgical changes are seen of total left knee arthroplasty with long femoral stem component, similar to 06/25/2023. There is an acute periprosthetic spiral fracture around the proximal, mid, and distal portions of the left femoral stem prosthesis component. This fracture extends through the lateral cortex of the mid femoral diaphysis just lateral to the proximal tip of the femoral stem with approximately 5 mm cortical step-off. There is up to approximately 5 mm AP dimension diastasis  of a more distal spiral fracture. There are again multiple distal femoral metadiaphyseal cerclage wires. There is again high-grade bone thinning at the distal medial aspect of the femoral prosthesis, greatest at the metaphysis. There are again partially visualized lucencies around the deep aspect of the tibial tray prosthesis component. IMPRESSION: Redemonstration of total left knee arthroplasty with long femoral stem component. Acute periprosthetic spiral fracture around the proximal, mid, and distal portions of the left femoral stem prosthesis component. Electronically Signed   By: Neita Garnet M.D.   On: 06/25/2023 09:30      LOS: 2 days    Joycelyn Das, MD [facility] Available via Epic secure chat 7am-7pm After these hours, please refer to coverage  provider listed on [email or web address] 06/27/2023, 7:51 AM

## 2023-06-27 NOTE — Progress Notes (Signed)
Patient ID: Justin Hurst, male   DOB: April 09, 1956, 67 y.o.   MRN: [id number]  Orthopaedic Trauma Service Progress Note  Patient ID: Justin Hurst MRN: [id number] DOB/AGE: 01-25-1956 67 y.o.  Subjective:  Pain controlled to L leg  Worked a little with therapy today   Last surgery on L knee was 2019  R knee mobility is limited due to end stage arthritis  He knows he needs a TKR  Has had steroid injections which no longer work  Has not done any viscosupplementation  Has not done bracing   R knee will be a rate limiter in his mobility and recovery from his L periprosthetic femur fracture   Really would like to dc home but would agree to CIR if available   S/p brain tumor resection last September at [facility] and then spent 2 months in [place] for radiation   Lives with wife  1 story house with full basement   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   Weight as of this encounter: 83.9 kg.   Intake/Output      10/03 0701 10/04 0700 10/04 0701 10/05 0700   P.O.     I.V. (mL/kg) 2138.7 (25.5)    IV Piggyback 400    Total Intake(mL/kg) 2538.7 (30.3)    Urine (mL/kg/hr) 3000 (1.5)    Blood 50    Total Output 3050    Net -511.3           LABS  Results for orders placed or performed during the hospital encounter of 06/25/23 (from the past 24 hour(s))  Surgical pcr screen     Status: Abnormal   Collection Time: 06/26/23 10:16 AM   Specimen: Nasal Mucosa; Nasal Swab  Result Value  Ref Range   MRSA, PCR POSITIVE (A) NEGATIVE   Staphylococcus aureus POSITIVE (A) NEGATIVE  Basic metabolic panel     Status: Abnormal   Collection Time: 06/27/23  6:18 AM  Result Value Ref Range   Sodium 135 135 - 145 mmol/L   Potassium 3.5 3.5 - 5.1 mmol/L   Chloride 101 98 - 111 mmol/L   CO2 26 22 - 32 mmol/L   Glucose, Bld 119 (H) 70 - 99 mg/dL   BUN 15 8 - 23 mg/dL   Creatinine, Ser 4.09 0.61 - 1.24 mg/dL   Calcium 8.3 (L) 8.9 - 10.3 mg/dL   GFR, Estimated >81 >19 mL/min   Anion gap 8 5 - 15  CBC     Status: Abnormal   Collection Time: 06/27/23  6:18 AM  Result Value Ref Range   WBC 9.2 4.0 - 10.5 K/uL   RBC 3.70 (L) 4.22 - 5.81 MIL/uL   Hemoglobin 11.5 (L) 13.0 - 17.0 g/dL   HCT 14.7 (  Patient ID: Justin Hurst, male   DOB: April 09, 1956, 67 y.o.   MRN: [id number]

## 2023-06-27 NOTE — Evaluation (Signed)
Physical Therapy Evaluation Patient Details Name: Justin Hurst MRN: [id number] DOB: 1956-03-13 Today's Date: 06/27/2023  History of Present Illness  67 yo male admitted 06/25/23 s/p fall with Lt femur fx s/p ORIF 10/3. PMhx: Lt TKA x 3, bil ankle fusions, depression, anxiety, chronic pain, lt femur fx with ORIF 2019  Clinical Impression  Pt pleasant with initial pain 8/10 but received dilaudid prior to mobility with pain 6/10 end of session. Pt with slow processing and difficulty accurately following commands (may be due to medication). Pt with decreased strength, ROM, transfers and mobility who will benefit from acute therapy to maximize mobility, safety and independence. Pt normally cares for himself without AD. Wife cannot be present 24hr and pt would need to be able to transfer to [facility] on his own to be able to return home. Patient will benefit from continued inpatient follow up therapy, <3 hours/day          If plan is discharge home, recommend the following: A lot of help with walking and/or transfers;A lot of help with bathing/dressing/bathroom;Assistance with cooking/housework;Assist for transportation;Help with stairs or ramp for entrance   Can travel by private vehicle   No    Equipment Recommendations BSC/3in1;Wheelchair (measurements PT);Wheelchair cushion (measurements PT)  Recommendations for Other Services  OT consult    Functional Status Assessment Patient has had a recent decline in their functional status and demonstrates the ability to make significant improvements in function in a reasonable and predictable amount of time.     Precautions / Restrictions Precautions Precautions: Fall Restrictions Weight Bearing Restrictions: Yes LLE Weight Bearing: Touchdown weight bearing      Mobility  Bed Mobility Overal bed mobility: Needs Assistance Bed Mobility: Supine to Sit     Supine to sit: Min assist, HOB elevated, Used rails     General bed mobility comments:  HOB 35 degrees with physical assist to move LLE toward EOB with cues for sequence and increased time    Transfers Overall transfer level: Needs assistance Equipment used: Rolling walker (2 wheels) Transfers: Sit to/from Stand, Bed to chair/wheelchair/BSC Sit to Stand: Min assist, +2 physical assistance   Step pivot transfers: Mod assist, +2 safety/equipment       General transfer comment: min assist to rise from bed with cues for hand placement and sequence. P.T. foot under pt LLE to monitor TTWB status with pt requiring frequent and max cues to off weight LLE. Pt unable to maintain NWB to hop and able to perform a few short hops with mod assist to control WB, cues for sequence and +2 for safety/IV/chair follow to pivot to chair. In chair pt performed partial lateral scoot toward bed to practice sequence for return with nursing with mod cues, min assist    Ambulation/Gait               General Gait Details: not currently able  Stairs            Wheelchair Mobility     Tilt Bed    Modified Rankin (Stroke Patients Only)       Balance Overall balance assessment: Needs assistance Sitting-balance support: No upper extremity supported, Feet supported Sitting balance-Leahy Scale: Fair     Standing balance support: Bilateral upper extremity supported, Reliant on assistive device for balance Standing balance-Leahy Scale: Poor Standing balance comment: min assist with RW to maintain TTwB  Pertinent Vitals/Pain Pain Assessment Pain Assessment: 0-10 Pain Score: 6  Pain Location: LLE after dilaudid Pain Descriptors / Indicators: Aching, Guarding Pain Intervention(s): Limited activity within patient's tolerance, Repositioned, Monitored during session, Premedicated before session    Home Living Family/patient expects to be discharged to:: Private residence Living Arrangements: Spouse/significant other Available Help at  Discharge: Family;Available PRN/intermittently Type of Home: House Home Access: Stairs to enter   Entrance Stairs-Number of Steps: 5   Home Layout: Two level;Able to live on main level with bedroom/bathroom Home Equipment: Rolling Walker (2 wheels);Cane - single point;Crutches;Shower seat      Prior Function Prior Level of Function : Independent/Modified Independent;Driving                     Extremity/Trunk Assessment   Upper Extremity Assessment Upper Extremity Assessment: Generalized weakness    Lower Extremity Assessment Lower Extremity Assessment: LLE deficits/detail LLE Deficits / Details: grossly 1/5 with significant difficulty moving and positioning leg, knee flexion limited to grossly 30 degrees due to pain    Cervical / Trunk Assessment Cervical / Trunk Assessment: Normal  Communication   Communication Communication: No apparent difficulties  Cognition Arousal: Alert Behavior During Therapy: WFL for tasks assessed/performed Overall Cognitive Status: Impaired/Different from baseline Area of Impairment: Following commands                       Following Commands: Follows one step commands with increased time                General Comments      Exercises     Assessment/Plan    PT Assessment Patient needs continued PT services  PT Problem List Decreased strength;Decreased mobility;Decreased range of motion;Decreased activity tolerance;Decreased cognition;Decreased balance;Decreased knowledge of use of DME;Pain;Decreased coordination       PT Treatment Interventions DME instruction;Therapeutic exercise;Gait training;Stair training;Functional mobility training;Therapeutic activities;Patient/family education;Balance training;Cognitive remediation    PT Goals (Current goals can be found in the Care Plan section)  Acute Rehab PT Goals Patient Stated Goal: return home, watch tv PT Goal Formulation: With patient/family Time For Goal  Achievement: 07/11/23 Potential to Achieve Goals: Good    Frequency Min 1X/week     Co-evaluation               AM-PAC PT "6 Clicks" Mobility  Outcome Measure Help needed turning from your back to your side while in a flat bed without using bedrails?: A Little Help needed moving from lying on your back to sitting on the side of a flat bed without using bedrails?: A Little Help needed moving to and from a bed to a chair (including a wheelchair)?: A Lot Help needed standing up from a chair using your arms (e.g., wheelchair or bedside chair)?: A Lot Help needed to walk in hospital room?: Total Help needed climbing 3-5 steps with a railing? : Total 6 Click Score: 12    End of Session   Activity Tolerance: Patient tolerated treatment well Patient left: in chair;with call bell/phone within reach;with chair alarm set Nurse Communication: Mobility status PT Visit Diagnosis: Other abnormalities of gait and mobility (R26.89);Muscle weakness (generalized) (M62.81);History of falling (Z91.81)    Time: 1610-9604 PT Time Calculation (min) (ACUTE ONLY): 28 min   Charges:   PT Evaluation $PT Eval Moderate Complexity: 1 Mod PT Treatments $Therapeutic Activity: 8-22 mins PT General Charges $$ ACUTE PT VISIT: 1 Visit         Bennett Vanscyoc P, PT  Acute Rehabilitation Services Office: [phone]   Cristine Polio 06/27/2023, 10:03 AM

## 2023-06-28 DIAGNOSIS — G894 Chronic pain syndrome: Secondary | ICD-10-CM | POA: Diagnosis not present

## 2023-06-28 DIAGNOSIS — M9712XA 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, initial encounter: Secondary | ICD-10-CM | POA: Diagnosis not present

## 2023-06-28 DIAGNOSIS — S7225XA Nondisplaced subtrochanteric fracture of left femur, initial encounter for closed fracture: Secondary | ICD-10-CM | POA: Diagnosis not present

## 2023-06-28 LAB — BASIC METABOLIC PANEL
Anion gap: 5 (ref 5–15)
BUN: 13 mg/dL (ref 8–23)
CO2: 29 mmol/L (ref 22–32)
Calcium: 8.6 mg/dL — ABNORMAL LOW (ref 8.9–10.3)
Chloride: 103 mmol/L (ref 98–111)
Creatinine, Ser: 1.02 mg/dL (ref 0.61–1.24)
GFR, Estimated: >60 mL/min (ref >60)
Glucose, Bld: 97 mg/dL (ref 70–99)
Potassium: 4 mmol/L (ref 3.5–5.1)
Sodium: 137 mmol/L (ref 135–145)

## 2023-06-28 LAB — CBC
HCT: 36.5 % — ABNORMAL LOW (ref 39.0–52.0)
Hemoglobin: 12 g/dL — ABNORMAL LOW (ref 13.0–17.0)
MCH: 31.5 pg (ref 26.0–34.0)
MCHC: 32.9 g/dL (ref 30.0–36.0)
MCV: 95.8 fL (ref 80.0–100.0)
Platelets: 227 10*3/uL (ref 150–400)
RBC: 3.81 MIL/uL — ABNORMAL LOW (ref 4.22–5.81)
RDW: 13.6 % (ref 11.5–15.5)
WBC: 7.5 10*3/uL (ref 4.0–10.5)
nRBC: 0 % (ref 0.0–0.2)

## 2023-06-28 LAB — MAGNESIUM: Magnesium: 2.1 mg/dL (ref 1.7–2.4)

## 2023-06-28 NOTE — Plan of Care (Signed)

## 2023-06-28 NOTE — Plan of Care (Signed)

## 2023-06-28 NOTE — Progress Notes (Signed)
Physical Therapy Treatment Patient Details Name: Justin Hurst MRN: [id number] DOB: 03-17-1956 Today's Date: 06/28/2023   History of Present Illness 67 yo male admitted 06/25/23 s/p fall with Lt femur fx s/p ORIF 10/3. PMhx: Lt TKA x 3, bil ankle fusions, depression, anxiety, chronic pain, lt femur fx with ORIF 2019    PT Comments  Pt is slowly progressing with goals. Currently pt is Min A +2 for standing to maintain TDWB status, CGA for bed mobility and CGA for lateral transfer w/o sliding board from EOB to recliner. Due to pt current stability and ability to maintain TDWB did not progress gait this session. Pt will benefit from skilled physical therapy services < 3 hours/day on discharge from acute care hospital setting in order to improve independence, decrease risk for falls, injury and re-hospitalization. Pt may progress to home at W/C level 3x/weekly skilled physical therapy services with total assist for stairs. Pt tolerated treatment session well.    If plan is discharge home, recommend the following: Assistance with cooking/housework;Assist for transportation;Help with stairs or ramp for entrance;A little help with walking and/or transfers   Can travel by private vehicle     No  Equipment Recommendations  BSC/3in1;Wheelchair (measurements PT);Wheelchair cushion (measurements PT);Other (comment) (sliding board)       Precautions / Restrictions Precautions Precautions: Fall Required Braces or Orthoses: Knee Immobilizer - Left (not yet received; from outside vendor) Restrictions Weight Bearing Restrictions: Yes LLE Weight Bearing: Touchdown weight bearing     Mobility  Bed Mobility Overal bed mobility: Needs Assistance Bed Mobility: Supine to Sit     Supine to sit: Contact guard     General bed mobility comments: HOB slightly elevated, increased time with CGA for safety overall no physical assist needed.    Transfers Overall transfer level: Needs assistance Equipment  used: Rolling walker (2 wheels), None Transfers: Sit to/from Stand, Bed to chair/wheelchair/BSC Sit to Stand: Min assist, +2 physical assistance, From elevated surface          Lateral/Scoot Transfers: Contact guard assist General transfer comment: Pt with improved TDWB this session. 1x difficulty maintaining when distracted and returned to good adherence with cueing. Pt was able to perform sit to stand 3x during session and stand ~2 min maintaining WB status. Pt was able to perform lateral scoot at [facility] following instruction on technique for 3 point (bil UE, RLE)    Ambulation/Gait     General Gait Details: not currently able     Balance Overall balance assessment: Needs assistance Sitting-balance support: No upper extremity supported, Feet supported Sitting balance-Leahy Scale: Fair     Standing balance support: Bilateral upper extremity supported, Reliant on assistive device for balance Standing balance-Leahy Scale: Poor Standing balance comment: min assist with RW to maintain TTwB          Cognition Arousal: Alert Behavior During Therapy: WFL for tasks assessed/performed Overall Cognitive Status: Within Functional Limits for tasks assessed           General Comments General comments (skin integrity, edema, etc.): wrap in place over LLE; no noted drainage.      Pertinent Vitals/Pain Pain Assessment Pain Assessment: Faces Faces Pain Scale: Hurts a little bit Pain Location: LLE Pain Descriptors / Indicators: Discomfort Pain Intervention(s): Monitored during session, Limited activity within patient's tolerance     PT Goals (current goals can now be found in the care plan section) Acute Rehab PT Goals Patient Stated Goal: return home, watch tv PT Goal Formulation: With patient/family  Time For Goal Achievement: 07/11/23 Potential to Achieve Goals: Good Progress towards PT goals: Progressing toward goals    Frequency    Min 1X/week      PT Plan   Continue with current POC       AM-PAC PT "6 Clicks" Mobility   Outcome Measure  Help needed turning from your back to your side while in a flat bed without using bedrails?: A Little Help needed moving from lying on your back to sitting on the side of a flat bed without using bedrails?: A Little Help needed moving to and from a bed to a chair (including a wheelchair)?: A Little Help needed standing up from a chair using your arms (e.g., wheelchair or bedside chair)?: A Lot Help needed to walk in hospital room?: Total Help needed climbing 3-5 steps with a railing? : Total 6 Click Score: 13    End of Session Equipment Utilized During Treatment: Gait belt Activity Tolerance: Patient tolerated treatment well Patient left: in chair;with call bell/phone within reach Nurse Communication: Mobility status PT Visit Diagnosis: Other abnormalities of gait and mobility (R26.89);Muscle weakness (generalized) (M62.81);History of falling (Z91.81)     Time: 1208-1224 PT Time Calculation (min) (ACUTE ONLY): 16 min  Charges:    $Therapeutic Activity: 8-22 mins PT General Charges $$ ACUTE PT VISIT: 1 Visit                    Harrel Carina, DPT, CLT  Acute Rehabilitation Services Office: [phone] (Secure chat preferred)    Claudia Desanctis 06/28/2023, 1:27 PM

## 2023-06-28 NOTE — Progress Notes (Signed)
Justin Hurst PROGRESS NOTE    Justin Hurst  WUJ:[id number] DOB: August 16, 1956 DOA: 06/25/2023 PCP: [facility]    Brief Narrative:   Justin Hurst is a 67 y.o. male with PMH significant for depression, anxiety, left knee replacement x 3 presented  to the hospital after mechanical fall without preceding dizziness or lightheadedness.  He had tripped and fell.  In the ED, he was hemodynamically stable.  Labs were within normal range except for WBC at 14.4.  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  Patient was then considered for admission to the hospital for further evaluation and treatment.  Assessment plan.  Left periprosthetic distal femur fracture status post ORIF with plate insertion on 06/26/2023. Patient with history of left knee replacement x 3 presented status post fall at home.  CT scan of the knee on the left showed acute periprosthetic spiral fracture extending from proximal to distal aspect of the left femoral stem prosthesis.  Orthopedics was consulted and patient underwent ORIF with plate insertion on 06/26/2023.  At this time recommendation is  touchdown weightbearing and unrestricted range of motion of the knee without patient.  Full pharmacological prophylaxis.  Follow-up in 10 to 14 days for removal of sutures.  PT OT has assessed the patient and recommended skilled nursing facility.  Will need to focus on adequate pain management for rehabilitation.  Leukocytosis: Likely reactive.  CBC normalized at this time.  Hemoglobin of 12.0.   Anxiety/depression Continue Zoloft and alprazolam.   Elevated blood pressure: Likely due to the pain.  Improved at this time.  On as needed hydralazine.  Focus on good pain management.    DVT prophylaxis: SCDs Start: 06/26/23 1547 enoxaparin  (LOVENOX) injection 40 mg Start: 06/25/23 1300   Code Status:     Code Status: Full Code  Disposition: PT has recommended skilled nursing facility placement at this time.  Status is: Inpatient  Remains inpatient appropriate because: Status post surgical intervention.   Family Communication: Spoke with the patient's spouse at bedside on 06/28/2023  Consultants:  Orthopedics  Procedures:  ORIF with plate insertion 06/26/2023 by orthopedics.  Antimicrobials:  Preoperative IV antibiotic x 1  Anti-infectives (From admission, onward)    Start     Dose/Rate Route Frequency Ordered Stop   06/26/23 1700  ceFAZolin (ANCEF) IVPB 2g/100 mL premix        2 g 200 mL/hr over 30 Minutes Intravenous Every 6 hours 06/26/23 1546 06/26/23 2342   06/26/23 0600  ceFAZolin (ANCEF) IVPB 2g/100 mL premix        2 g 200 mL/hr over 30 Minutes Intravenous On call to O.R. 06/25/23 1646 06/26/23 1115       Subjective: Today, patient was seen and examined at bedside.  Complains of mild surgical site pain.  Denies any nausea vomiting fever chills or rigor.    Objective: Vitals:   06/27/23 1615 06/27/23 2123 06/28/23 0426 06/28/23 0831  BP: 126/76 115/75 135/83 (!) 154/90  Pulse: 73 81 72 81  Resp: 18 17 17 18   Temp: 98 F (36.7 C) 98.4 F (36.9 C) 98 F (36.7 C) 98.6 F (37 C)  TempSrc:  Oral Oral Oral  SpO2: 95% 93% 95% 94%  Weight:      Height:        Intake/Output Summary (Last 24 hours)  at 06/28/2023 0853 Last data filed at 06/28/2023 0609 Gross per 24 hour  Intake 1917.83 ml  Output 950 ml  Net 967.83 ml   Filed Weights   06/25/23 0748  Weight: 83.9 kg    Physical Examination: Body mass index is 29.86 kg/m.   General:  Average built, not in obvious distress HENT:   No scleral pallor or icterus noted. Oral mucosa is moist.  Chest: Clear breath sounds bilaterally no crackles or wheezes.  CVS: S1 &S2 heard. No murmur.  Regular rate and rhythm. Abdomen: Soft, nontender,  nondistended.  Bowel sounds are heard.   Extremities: No cyanosis, clubbing or edema.  Left lower extremity status post ORIF.  Distal capillary refill present.   Psych: Alert, awake and oriented, normal mood CNS:  No cranial nerve deficits.  Power equal in all extremities.   Skin: Warm and dry.  No rashes noted.  Data Reviewed:   CBC: Recent Labs  Lab 06/25/23 0750 06/25/23 1702 06/26/23 0516 06/27/23 0618 06/28/23 0624  WBC 14.5* 10.5 8.7 9.2 7.5  NEUTROABS 12.5*  --   --   --   --   HGB 14.5 14.5 13.1 11.5* 12.0*  HCT 43.6 43.1 39.8 34.5* 36.5*  MCV 94.6 91.5 92.8 93.2 95.8  PLT 252 250 218 209 227    Basic Metabolic Panel: Recent Labs  Lab 06/25/23 0750 06/25/23 1702 06/26/23 0516 06/27/23 0618 06/28/23 0624  NA 137  --  135 135 137  K 4.1  --  3.5 3.5 4.0  CL 104  --  100 101 103  CO2 25  --  26 26 29   GLUCOSE 111*  --  102* 119* 97  BUN 22  --  25* 15 13  CREATININE 0.88 1.01 0.96 0.99 1.02  CALCIUM 8.8*  --  8.7* 8.3* 8.6*  MG  --   --  2.1 2.2 2.1  PHOS  --   --  2.7  --   --     Liver Function Tests: Recent Labs  Lab 06/26/23 0516  AST 24  ALT 27  ALKPHOS 91  BILITOT 0.8  PROT 6.7  ALBUMIN 3.4*     Radiology Studies: DG Knee Right Port  Result Date: 06/27/2023 CLINICAL DATA:  Pain. EXAM: PORTABLE RIGHT KNEE - 2 VIEW COMPARISON:  Right knee 04/05/2023 FINDINGS: Mild joint space loss of the medial compartment. Small osteophytes seen of the medial compartment and patellofemoral joint. There is subchondral cyst formation and sclerosis along the medial compartment both the femoral condyle in the tibia. Adjacent small bone fragments. Chondrocalcinosis of the mediolateral compartment. No significant joint effusion on lateral view. No fracture or dislocation. Preserved bone mineralization. IMPRESSION: Tricompartment degenerative changes greatest of the medial compartment. Chondrocalcinosis. Electronically Signed   By: Karen Kays M.D.   On: 06/27/2023  16:54   DG FEMUR PORT MIN 2 VIEWS LEFT  Result Date: 06/26/2023 CLINICAL DATA:  Fracture, postop. EXAM: LEFT FEMUR PORTABLE 2 VIEWS COMPARISON:  Preoperative imaging. FINDINGS: Lateral plate and multi screw fixation of femoral shaft fracture. Previous revision knee arthroplasty with cerclage wire fixation. Recent postsurgical change includes air and edema in the soft tissues. IMPRESSION: ORIF of femoral shaft fracture without immediate postoperative complication. Electronically Signed   By: Narda Rutherford M.D.   On: 06/26/2023 17:20   DG FEMUR MIN 2 VIEWS LEFT  Result Date: 06/26/2023 CLINICAL DATA:  Elective surgery. EXAM: LEFT FEMUR 2 VIEWS COMPARISON:  Preoperative radiographs. FINDINGS: Twenty-one fluoroscopic spot views of the  left femur obtained in the operating room. Sequential images during lateral plate and screw fixation of periprosthetic femur fracture. Fluoroscopy time 1 minutes 53 seconds. Dose 34.8 mGy. IMPRESSION: Intraoperative fluoroscopy during periprosthetic femur fracture fixation. Electronically Signed   By: Narda Rutherford M.D.   On: 06/26/2023 16:35   DG C-Arm 1-60 Min-No Report  Result Date: 06/26/2023 Fluoroscopy was utilized by the requesting physician.  No radiographic interpretation.   DG C-Arm 1-60 Min-No Report  Result Date: 06/26/2023 Fluoroscopy was utilized by the requesting physician.  No radiographic interpretation.   DG C-Arm 1-60 Min-No Report  Result Date: 06/26/2023 Fluoroscopy was utilized by the requesting physician.  No radiographic interpretation.      LOS: 3 days    Joycelyn Das, MD [facility] Available via Epic secure chat 7am-7pm After these hours, please refer to coverage provider listed on [email or web address] 06/28/2023, 8:53 AM

## 2023-06-29 DIAGNOSIS — M9712XA 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, initial encounter: Secondary | ICD-10-CM | POA: Diagnosis not present

## 2023-06-29 DIAGNOSIS — G894 Chronic pain syndrome: Secondary | ICD-10-CM | POA: Diagnosis not present

## 2023-06-29 DIAGNOSIS — S7225XA Nondisplaced subtrochanteric fracture of left femur, initial encounter for closed fracture: Secondary | ICD-10-CM | POA: Diagnosis not present

## 2023-06-29 MED ORDER — DIPHENHYDRAMINE HCL 25 MG PO CAPS
25.0000 mg | ORAL_CAPSULE | Freq: Four times a day (QID) | ORAL | Status: DC | PRN
  Administered 2023-06-29: 25 mg via ORAL
  Filled 2023-06-29: qty 1

## 2023-06-29 NOTE — TOC Initial Note (Signed)
Transition of Care [facility]) - Initial/Assessment Note    Patient Details  Name: Justin Hurst MRN: [id number] Date of Birth: 07/08/1956  Transition of Care [facility]) CM/SW Contact:    Jimmy Picket, LCSW Phone Number: 06/29/2023, 11:42 AM  Clinical Narrative:                  CSW received SNF consult. CSW met with pt and wife at bedside. CSW introduced self and explained role at the hospital. Pt reports that PTA He lived at home with Wife and was independent with mobility and ADL's.   CSW reviewed PT/OT recommendations for SNF. Pt reports he is OK with going to SNF. Pt gave CSW permission to fax out to facilities in the area. Pt has no preference of facility at this time. CSW gave pt medicare.gov rating list to review. CSW explained insurance auth process.  CSW will continue to follow.   Expected Discharge Plan: Skilled Nursing Facility Barriers to Discharge: Continued Medical Work up   Patient Goals and CMS Choice Patient states their goals for this hospitalization and ongoing recovery are:: To make a full recovery CMS Medicare.gov Compare Post Acute Care list provided to:: Patient Choice offered to / list presented to : Patient [place] ownership interest in [facility].provided to:: Patient    Expected Discharge Plan and Services       Living arrangements for the past 2 months: Skilled Nursing Facility                                      Prior Living Arrangements/Services Living arrangements for the past 2 months: Skilled Nursing Facility Lives with:: Self Patient language and need for interpreter reviewed:: Yes Do you feel safe going back to the place where you live?: Yes      Need for Family Participation in Patient Care: Yes (Comment) Care giver support system in place?: Yes (comment)   Criminal Activity/Legal Involvement Pertinent to Current Situation/Hospitalization: No - Comment as needed  Activities of Daily Living   ADL Screening  (condition at time of admission) Independently performs ADLs?: Yes (appropriate for developmental age) Is the patient deaf or have difficulty hearing?: No Does the patient have difficulty seeing, even when wearing glasses/contacts?: No Does the patient have difficulty concentrating, remembering, or making decisions?: No  Permission Sought/Granted Permission sought to share information with : Facility Industrial/product designer granted to share information with : Yes, Verbal Permission Granted     Permission granted to share info w AGENCY: SNF        Emotional Assessment Appearance:: Appears stated age Attitude/Demeanor/Rapport: Engaged Affect (typically observed): Accepting, Appropriate Orientation: : Oriented to Self, Oriented to Place, Oriented to  Time, Oriented to Situation Alcohol / Substance Use: Never Used Psych Involvement: No (comment)  Admission diagnosis:  Closed left subtrochanteric femur fracture (HCC) [S72.22XA] Fall, initial encounter [W19.XXXA] Patient Active Problem List   Diagnosis Date Noted   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   Osteoarthritis 11/09/2021   Chronic pain syndrome 10/02/2018   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 03/09/2018   Acute pain of left knee 02/13/2018   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   Localized osteoarthritis of left ankle 11/11/2016   Mass of joint of left ankle 11/11/2016   PCP:  [facility]:   [facility] [address] [place] - [address] [place] [address] Phone: [phone] Fax: [phone]  [facility] [place] [id number] [place], [place] - [address] AT [facility] [address] [place] [address] Phone: [phone] Fax: [phone]     Social Determinants of Health (SDOH) Social History: SDOH Screenings   Food Insecurity: No Food Insecurity (06/25/2023)  Housing: Low Risk   (06/25/2023)  Transportation Needs: No Transportation Needs (06/25/2023)  Utilities: Not At Risk (06/25/2023)  Tobacco Use: Low Risk  (06/25/2023)   SDOH Interventions:     Readmission Risk Interventions     No data to display

## 2023-06-29 NOTE — NC FL2 (Signed)
[place] MEDICAID FL2 LEVEL OF CARE FORM     IDENTIFICATION  Patient Name: Justin Hurst Birthdate: [birth year] Sex: male Admission Date (Current Location): 06/25/2023  [facility] [place] Number:  Producer, television/film/video and Address:  [facility], 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 03/09/2018   Acute pain of left knee 02/13/2018   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   Localized osteoarthritis of left ankle 11/11/2016   Mass of joint of left ankle 11/11/2016    Orientation RESPIRATION BLADDER Height & Weight     Time, Self, Situation, Place    Continent Weight: 185 lb (83.9 kg) Height:  5\' 6"  (167.6 cm)  BEHAVIORAL SYMPTOMS/MOOD NEUROLOGICAL BOWEL NUTRITION STATUS      Continent Diet  AMBULATORY STATUS COMMUNICATION OF NEEDS Skin   Extensive Assist Verbally Surgical wounds                       Personal Care Assistance Level of Assistance  Bathing, Feeding, Dressing Bathing Assistance: Limited assistance Feeding assistance: Independent Dressing Assistance: Limited assistance     Functional Limitations Info  Sight, Hearing, Speech Sight Info: Adequate Hearing Info: Adequate Speech Info: Adequate    SPECIAL CARE FACTORS FREQUENCY  PT (By licensed PT), OT (By licensed OT)     PT Frequency: 5x a week OT Frequency: 5x a week            Contractures  Contractures Info: Not present    Additional Factors Info  Allergies, Code Status Code Status Info: Full Allergies Info: NKA           Current Medications (06/29/2023):  This is the current hospital active medication list Current Facility-Administered Medications  Medication Dose Route Frequency Provider Last Rate Last Admin   acetaminophen (TYLENOL) tablet 1,000 mg  1,000 mg Oral Q8H Justin Morita, PA-C   1,000 mg at 06/28/23 1703   acetaminophen (TYLENOL) tablet 325-650 mg  325-650 mg Oral Q6H PRN Justin Morita, PA-C   650 mg at 06/29/23 0720   ALPRAZolam (XANAX) tablet 1 mg  1 mg Oral QHS Justin Morita, PA-C   1 mg at 06/28/23 2104   docusate sodium (COLACE) capsule 100 mg  100 mg Oral BID Justin Morita, PA-C   100 mg at 06/29/23 0858   enoxaparin (LOVENOX) injection 40 mg  40 mg Subcutaneous Q24H Justin Morita, PA-C   40 mg at 06/28/23 1327   hydrALAZINE (APRESOLINE) injection 10 mg  10 mg Intravenous Q8H PRN Justin Morita, PA-C       HYDROmorphone (DILAUDID) injection 0.5-1 mg  0.5-1 mg Intravenous Q4H PRN Justin Morita, PA-C   1 mg at 06/28/23 6644   lidocaine (LIDODERM) 5 % 1 patch  1 patch Transdermal Q24H Justin Morita, PA-C   1 patch at 06/28/23 1327   menthol-cetylpyridinium (CEPACOL) lozenge 3 mg  1 lozenge Oral PRN Justin Hurst,  Mellody Dance, PA-C       Or   phenol (CHLORASEPTIC) mouth spray 1 spray  1 spray Mouth/Throat PRN Justin Morita, PA-C       methocarbamol (ROBAXIN) tablet 500 mg  500 mg Oral TID Justin Morita, PA-C   500 mg at 06/29/23 4098   Or   methocarbamol (ROBAXIN) 500 mg in dextrose 5 % 50 mL IVPB  500 mg Intravenous TID Justin Morita, PA-C       metoCLOPramide (REGLAN) tablet 5-10 mg  5-10 mg Oral Q8H PRN Justin Morita, PA-C       Or   metoCLOPramide (REGLAN) injection 5-10 mg  5-10 mg Intravenous Q8H PRN Justin Morita, PA-C       ondansetron [facility]) tablet 4 mg  4 mg Oral Q6H PRN Justin Morita, PA-C       Or   ondansetron [facility]) injection 4 mg  4 mg Intravenous Q6H PRN Justin Morita, PA-C        oxyCODONE (Oxy IR/ROXICODONE) immediate release tablet 5-10 mg  5-10 mg Oral Q4H PRN Justin Morita, PA-C   10 mg at 06/29/23 0720   polyethylene glycol (MIRALAX / GLYCOLAX) packet 17 g  17 g Oral Daily PRN Justin Morita, PA-C       senna (SENOKOT) tablet 8.6 mg  1 tablet Oral BID Justin Morita, PA-C   8.6 mg at 06/29/23 0858   sertraline (ZOLOFT) tablet 100 mg  100 mg Oral QPM Justin Morita, PA-C   100 mg at 06/28/23 2104   simethicone (MYLICON) 40 MG/0.6ML suspension 40 mg  40 mg Oral QID PRN Justin Morita, PA-C         Discharge Medications: Please see discharge summary for a list of discharge medications.  Relevant Imaging Results:  Relevant Lab Results:   Additional Information SSN [id number]  Justin Hurst, [place]

## 2023-06-29 NOTE — Plan of Care (Signed)

## 2023-06-29 NOTE — Progress Notes (Signed)
Patient ID: Justin Hurst MRN: [id number] DOB/AGE: [birth year] 67 y.o.  Subjective:  Pain controlled to L leg  Been denied CIR due to insurance, patient and family eager to get him more comfortable on walker in order to be able to return home with HHPT. Asking for options to work on exercises outside of inpatient PT visits.   Unloader brace of right knee has not yet been delivered. Otherwise no complaints  Objective:   VITALS:   Vitals:   06/28/23 0831 06/28/23 1600 06/28/23 2046 06/29/23 0456  BP: (!) 154/90 (!) 156/88 (!) 144/89 125/75  Pulse: 81 78 88 78  Resp: 18 18 18 17   Temp: 98.6 F (37 C) 97.8 F (36.6 C) 99.6 F (37.6 C) 98.4 F (36.9 C)  TempSrc: Oral Oral Oral Oral  SpO2: 94% 96% 97% 96%  Weight:      Height:        Estimated body mass index is 29.86 kg/m as calculated from the following:   Height as of this encounter: 5\' 6"  (1.676 m).   Weight as of this encounter: 83.9 kg.   Intake/Output      10/05 0701 10/06 0700 10/06 0701 10/07 0700   P.O.     I.V. (mL/kg) 260.5 (3.1)    Total Intake(mL/kg) 260.5 (3.1)    Urine (mL/kg/hr) 200 (0.1)    Stool 0    Total Output 200    Net +60.5           LABS  No results found for this or any previous visit (from the past 24 hour(s)).    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 06/25/23 1646 06/26/23 1115     .  POD/HD#: 58  67 y/o male s/p fall with L periprosthetic femur fracture around total knee components  -ORIF left periprosthetic femur fracture Weightbearing Touchdown weightbearing left leg  ROM/Activity Activity as tolerated while maintaining weightbearing restrictions Knee range of motion as tolerated  Wound care Dressing changes as needed starting today Ice and elevate for swelling and pain control  Therapy - - worked with PT yesterday, eager to work with them today. So far no PT assigned today, but wanted to receive exercises from PT that he can do in bed to increase strength outside of PT visits.   End-stage DJD right knee Patient has had multiple steroid injections and they are no longer effective  Would recommend him discussing with his total joint surgeon possibility of viscosupplementation.  Unloader brace has been ordered, not yet delivered  - Pain management:  Multimodal  - ABL  anemia/Hemodynamics  As above  Monitor  - Medical issues   Per primary   - DVT/PE prophylaxis:  Lovenox   Recommend DOAC at dc x 30 days----> eliquis 2.5 mg BID  - ID:   Periop abx  - Metabolic Bone Disease:  Vitamin d deficiency    supplement - Activity:  As above  - Impediments to fracture healing:  Vitamin d deficiency    - Dispo:  Ortho issue stable  PT/OT evals  CIR consult     Janine Ores, PA-C

## 2023-06-29 NOTE — Progress Notes (Signed)
[facility] PROGRESS NOTE    Justin Hurst  XLK:[id number] DOB: [birth year] DOA: 06/25/2023 PCP: [facility]    Brief Narrative:   Justin Hurst is a 67 y.o. male with PMH significant for depression, anxiety, left knee replacement x 3 presented  to the hospital after mechanical fall without preceding dizziness or lightheadedness.  He had tripped and fell.  In the ED, he was hemodynamically stable.  Labs were within normal range except for WBC at 14.4.  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  Patient was then considered for admission to the hospital for further evaluation and treatment.  Assessment plan.  Left periprosthetic distal femur fracture status post ORIF with plate insertion on 06/26/2023. Patient with history of left knee replacement x 3 presented status post fall at home.  CT scan of the knee on the left showed acute periprosthetic spiral fracture extending from proximal to distal aspect of the left femoral stem prosthesis.  Orthopedics was consulted and patient underwent ORIF with plate insertion on 06/26/2023.  At this time, recommendation is  touchdown weightbearing and unrestricted range of motion of the knee without patient.  Full pharmacological prophylaxis.  Follow-up in 10 to 14 days for removal of sutures. .  Will need to focus on adequate pain management for rehabilitation. PT OT has assessed the patient and recommended skilled nursing facility  Leukocytosis: Likely reactive.  CBC normalized at this time.  Hemoglobin of 12.0.   Anxiety/depression Continue Zoloft and alprazolam.   Elevated blood pressure: Likely due to the pain.  Improved at this time.  On as needed hydralazine.  Focus on good pain management.    DVT prophylaxis: SCDs Start: 06/26/23 1547 enoxaparin  (LOVENOX) injection 40 mg Start: 06/25/23 1300   Code Status:     Code Status: Full Code  Disposition: PT has recommended skilled nursing facility placement at this time.  Status is: Inpatient  Remains inpatient appropriate because: Status post surgical intervention.   Family Communication: Spoke with the patient's spouse at bedside on 06/29/2023  Consultants:  Orthopedics  Procedures:  ORIF with plate insertion 06/26/2023 by orthopedics.  Antimicrobials:  Preoperative IV antibiotic x 1  Anti-infectives (From admission, onward)    Start     Dose/Rate Route Frequency Ordered Stop   06/26/23 1700  ceFAZolin (ANCEF) IVPB 2g/100 mL premix        2 g 200 mL/hr over 30 Minutes Intravenous Every 6 hours 06/26/23 1546 06/26/23 2342   06/26/23 0600  ceFAZolin (ANCEF) IVPB 2g/100 mL premix        2 g 200 mL/hr over 30 Minutes Intravenous On call to O.R. 06/25/23 1646 06/26/23 1115       Subjective: Today, patient was seen and examined at bedside.  Denies overt pain shortness of breath nausea vomiting fever chills or dyspnea.  He was able to sit up in the bedside yesterday.  Patient's spouse at bedside.   Objective: Vitals:   06/28/23 0831 06/28/23 1600 06/28/23 2046 06/29/23 0456  BP: (!) 154/90 (!) 156/88 (!) 144/89 125/75  Pulse: 81 78 88 78  Resp: 18 18 18 17   Temp: 98.6 F (37 C) 97.8 F (36.6 C) 99.6 F (37.6 C) 98.4 F (36.9 C)  TempSrc: Oral Oral Oral Oral  SpO2: 94% 96% 97% 96%  Weight:  Height:        Intake/Output Summary (Last 24 hours) at 06/29/2023 0910 Last data filed at 06/28/2023 2100 Gross per 24 hour  Intake 260.52 ml  Output 200 ml  Net 60.52 ml   Filed Weights   06/25/23 0748  Weight: 83.9 kg    Physical Examination: Body mass index is 29.86 kg/m.   General:  Average built, not in obvious distress HENT:   No scleral pallor or icterus noted. Oral mucosa is moist.  Chest: Clear breath sounds bilaterally no crackles or wheezes.  CVS:  S1 &S2 heard. No murmur.  Regular rate and rhythm. Abdomen: Soft, nontender, nondistended.  Bowel sounds are heard.   Extremities:  Left lower extremity status post ORIF covered with Ace wrap..  Distal capillary refill present.   Psych: Alert, awake and oriented, normal mood CNS:  No cranial nerve deficits.  Moves extremities. Skin: Warm and dry.  No rashes noted.  Data Reviewed:   CBC: Recent Labs  Lab 06/25/23 0750 06/25/23 1702 06/26/23 0516 06/27/23 0618 06/28/23 0624  WBC 14.5* 10.5 8.7 9.2 7.5  NEUTROABS 12.5*  --   --   --   --   HGB 14.5 14.5 13.1 11.5* 12.0*  HCT 43.6 43.1 39.8 34.5* 36.5*  MCV 94.6 91.5 92.8 93.2 95.8  PLT 252 250 218 209 227    Basic Metabolic Panel: Recent Labs  Lab 06/25/23 0750 06/25/23 1702 06/26/23 0516 06/27/23 0618 06/28/23 0624  NA 137  --  135 135 137  K 4.1  --  3.5 3.5 4.0  CL 104  --  100 101 103  CO2 25  --  26 26 29   GLUCOSE 111*  --  102* 119* 97  BUN 22  --  25* 15 13  CREATININE 0.88 1.01 0.96 0.99 1.02  CALCIUM 8.8*  --  8.7* 8.3* 8.6*  MG  --   --  2.1 2.2 2.1  PHOS  --   --  2.7  --   --     Liver Function Tests: Recent Labs  Lab 06/26/23 0516  AST 24  ALT 27  ALKPHOS 91  BILITOT 0.8  PROT 6.7  ALBUMIN 3.4*     Radiology Studies: DG Knee Right Port  Result Date: 06/27/2023 CLINICAL DATA:  Pain. EXAM: PORTABLE RIGHT KNEE - 2 VIEW COMPARISON:  Right knee 04/05/2023 FINDINGS: Mild joint space loss of the medial compartment. Small osteophytes seen of the medial compartment and patellofemoral joint. There is subchondral cyst formation and sclerosis along the medial compartment both the femoral condyle in the tibia. Adjacent small bone fragments. Chondrocalcinosis of the mediolateral compartment. No significant joint effusion on lateral view. No fracture or dislocation. Preserved bone mineralization. IMPRESSION: Tricompartment degenerative changes greatest of the medial compartment. Chondrocalcinosis.  Electronically Signed   By: Karen Kays M.D.   On: 06/27/2023 16:54      LOS: 4 days    Joycelyn Das, MD [facility] Available via Epic secure chat 7am-7pm After these hours, please refer to coverage provider listed on [email or web address] 06/29/2023, 9:10 AM

## 2023-06-30 DIAGNOSIS — M9712XA 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, initial encounter: Secondary | ICD-10-CM | POA: Diagnosis not present

## 2023-06-30 DIAGNOSIS — M1711 Unilateral primary osteoarthritis, right knee: Secondary | ICD-10-CM

## 2023-06-30 DIAGNOSIS — G894 Chronic pain syndrome: Secondary | ICD-10-CM | POA: Diagnosis not present

## 2023-06-30 LAB — BASIC METABOLIC PANEL
Anion gap: 11 (ref 5–15)
BUN: 16 mg/dL (ref 8–23)
CO2: 25 mmol/L (ref 22–32)
Calcium: 9 mg/dL (ref 8.9–10.3)
Chloride: 101 mmol/L (ref 98–111)
Creatinine, Ser: 1.11 mg/dL (ref 0.61–1.24)
GFR, Estimated: >60 mL/min (ref >60)
Glucose, Bld: 103 mg/dL — ABNORMAL HIGH (ref 70–99)
Potassium: 3.7 mmol/L (ref 3.5–5.1)
Sodium: 137 mmol/L (ref 135–145)

## 2023-06-30 LAB — CBC
HCT: 32.9 % — ABNORMAL LOW (ref 39.0–52.0)
Hemoglobin: 10.9 g/dL — ABNORMAL LOW (ref 13.0–17.0)
MCH: 30.9 pg (ref 26.0–34.0)
MCHC: 33.1 g/dL (ref 30.0–36.0)
MCV: 93.2 fL (ref 80.0–100.0)
Platelets: 276 10*3/uL (ref 150–400)
RBC: 3.53 MIL/uL — ABNORMAL LOW (ref 4.22–5.81)
RDW: 13.5 % (ref 11.5–15.5)
WBC: 7.2 10*3/uL (ref 4.0–10.5)
nRBC: 0 % (ref 0.0–0.2)

## 2023-06-30 LAB — MAGNESIUM: Magnesium: 2 mg/dL (ref 1.7–2.4)

## 2023-06-30 MED ORDER — CHLORHEXIDINE GLUCONATE CLOTH 2 % EX PADS
6.0000 | MEDICATED_PAD | Freq: Every day | CUTANEOUS | Status: DC
  Administered 2023-07-01: 6 via TOPICAL

## 2023-06-30 MED ORDER — HYDROCORTISONE 1 % EX CREA
TOPICAL_CREAM | Freq: Two times a day (BID) | CUTANEOUS | Status: DC
  Filled 2023-06-30: qty 28

## 2023-06-30 MED ORDER — MUPIROCIN 2 % EX OINT
1.0000 | TOPICAL_OINTMENT | Freq: Two times a day (BID) | CUTANEOUS | Status: DC
  Administered 2023-06-30 – 2023-07-01 (×3): 1 via NASAL
  Filled 2023-06-30: qty 22

## 2023-06-30 NOTE — Progress Notes (Signed)
Justin Hurst [facility] PROGRESS NOTE    Justin Hurst  ZOX:[id number] DOB: 12-Aug-1956 DOA: 06/25/2023 PCP: [facility]    Brief Narrative:   Justin Hurst is a 67 y.o. male with PMH significant for depression, anxiety, left knee replacement x 3 presented  to the hospital after mechanical fall .He had tripped and fell.  In the ED, he was hemodynamically stable.  Labs were within normal range except for WBC at 14.4.  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  Patient was then considered for admission to the hospital for further evaluation and treatment.  Assessment plan.  Left periprosthetic distal femur fracture status post ORIF with plate insertion on 06/26/2023.  CT scan of the knee on the left showed acute periprosthetic spiral fracture extending from proximal to distal aspect of the left femoral stem prosthesis.  Orthopedics was consulted and patient underwent ORIF with plate insertion on 06/26/2023.  At this time, recommendation is  touchdown weightbearing and unrestricted range of motion of the knee without patient.  Full pharmacological prophylaxis.  Follow-up in 10 to 14 days for removal of sutures. PT OT has assessed the patient and recommended skilled nursing facility.  Continue pain management.  Leukocytosis: Likely reactive.  CBC normalized at this time.  Hemoglobin of 12.0.  Labs pending from today.   Anxiety/depression Continue Zoloft and alprazolam.  Itchy rash on the back.  Likely sweat rash.  Will add hydrocortisone cream..  Keep dry.   Elevated blood pressure: Likely secondary to pain.  Improved.    DVT prophylaxis: SCDs Start: 06/26/23 1547 enoxaparin (LOVENOX) injection 40 mg Start: 06/25/23 1300   Code Status:     Code Status: Full Code  Disposition: PT has recommended skilled  nursing facility placement at this time.  Medically stable for disposition.  Status is: Inpatient  Remains inpatient appropriate because: Status post surgical intervention.   Family Communication:  Spoke with the patient's spouse at bedside on 06/30/2023  Consultants:  Orthopedics  Procedures:  ORIF with plate insertion 06/26/2023 by orthopedics.  Antimicrobials:  Preoperative IV antibiotic x 1  Anti-infectives (From admission, onward)    Start     Dose/Rate Route Frequency Ordered Stop   06/26/23 1700  ceFAZolin (ANCEF) IVPB 2g/100 mL premix        2 g 200 mL/hr over 30 Minutes Intravenous Every 6 hours 06/26/23 1546 06/26/23 2342   06/26/23 0600  ceFAZolin (ANCEF) IVPB 2g/100 mL premix        2 g 200 mL/hr over 30 Minutes Intravenous On call to O.R. 06/25/23 1646 06/26/23 1115       Subjective: Today, patient was seen and examined at bedside.  Patient overall feels better.  Has some itchy rash on his back.  Denies any nausea vomiting or overt pain.     Objective: Vitals:   06/29/23 2206 06/30/23 0001 06/30/23 0517 06/30/23 0857  BP: (!) 140/86  122/79 104/71  Pulse: 82  75 84  Resp: 18  18 20   Temp:  98 F (36.7 C) 98.8 F (37.1 C) 98.2 F (36.8 C)  TempSrc:  Oral Oral Oral  SpO2: 96%  95% 92%  Weight:      Height:        Intake/Output Summary (Last 24 hours) at 06/30/2023 0948 Last data filed at 06/29/2023 1802 Gross per 24  hour  Intake --  Output 700 ml  Net -700 ml   Filed Weights   06/25/23 0748  Weight: 83.9 kg    Physical Examination: Body mass index is 29.86 kg/m.   General:  Average built, not in obvious distress HENT:   No scleral pallor or icterus noted. Oral mucosa is moist.  Chest: Clear breath sounds bilaterally no crackles or wheezes.  CVS: S1 &S2 heard. No murmur.  Regular rate and rhythm. Abdomen: Soft, nontender, nondistended.  Bowel sounds are heard.   Extremities:  Left lower extremity status post ORIF covered with Ace wrap..   Distal capillary refill present.   Psych: Alert, awake and oriented, normal mood CNS:  No cranial nerve deficits.  Moves extremities. Skin: Warm and dry.  Mild erythematous maculopapular eruptions on the back only.  Data Reviewed:   CBC: Recent Labs  Lab 06/25/23 0750 06/25/23 1702 06/26/23 0516 06/27/23 0618 06/28/23 0624  WBC 14.5* 10.5 8.7 9.2 7.5  NEUTROABS 12.5*  --   --   --   --   HGB 14.5 14.5 13.1 11.5* 12.0*  HCT 43.6 43.1 39.8 34.5* 36.5*  MCV 94.6 91.5 92.8 93.2 95.8  PLT 252 250 218 209 227    Basic Metabolic Panel: Recent Labs  Lab 06/25/23 0750 06/25/23 1702 06/26/23 0516 06/27/23 0618 06/28/23 0624  NA 137  --  135 135 137  K 4.1  --  3.5 3.5 4.0  CL 104  --  100 101 103  CO2 25  --  26 26 29   GLUCOSE 111*  --  102* 119* 97  BUN 22  --  25* 15 13  CREATININE 0.88 1.01 0.96 0.99 1.02  CALCIUM 8.8*  --  8.7* 8.3* 8.6*  MG  --   --  2.1 2.2 2.1  PHOS  --   --  2.7  --   --     Liver Function Tests: Recent Labs  Lab 06/26/23 0516  AST 24  ALT 27  ALKPHOS 91  BILITOT 0.8  PROT 6.7  ALBUMIN 3.4*     Radiology Studies: No results found.    LOS: 5 days    Joycelyn Das, MD [facility] Available via Epic secure chat 7am-7pm After these hours, please refer to coverage provider listed on [email or web address] 06/30/2023, 9:48 AM

## 2023-06-30 NOTE — Progress Notes (Signed)
Physical Therapy Treatment Patient Details Name: Justin Hurst MRN: [id number] DOB: 11-26-1955 Today's Date: 06/30/2023   History of Present Illness 67 yo male admitted 06/25/23 s/p fall with Lt femur fx s/p ORIF 10/3. PMhx: Lt TKA x 3, bil ankle fusions, depression, anxiety, chronic pain, lt femur fx with ORIF 2019    PT Comments  Patient progressing with different transfer strategies.  Educated in use of stedy as safer for nursing.  Wife continues to feel she cannot manage him at home till he can walk.  He was able to complete HEP and eager to work on gaining strength while not mobilizing much.  Patient reports waiting on hinged knee brace for R knee due to lateral instability.  PT will continue to follow in the acute setting.  Recommend inpatient rehab < 3 hours/day prior to d/c home.     If plan is discharge home, recommend the following: Assistance with cooking/housework;Assist for transportation;Help with stairs or ramp for entrance;A little help with walking and/or transfers   Can travel by private vehicle     No  Equipment Recommendations  BSC/3in1;Wheelchair (measurements PT);Wheelchair cushion (measurements PT);Other (comment)    Recommendations for Other Services       Precautions / Restrictions Precautions Precautions: Fall Required Braces or Orthoses: Other Brace Other Brace: for R knee hinged brace, though has not yet recieved Restrictions Weight Bearing Restrictions: Yes LLE Weight Bearing: Touchdown weight bearing     Mobility  Bed Mobility Overal bed mobility: Needs Assistance Bed Mobility: Supine to Sit     Supine to sit: Contact guard     General bed mobility comments: increased time, HOB up and some assist for L LE    Transfers Overall transfer level: Needs assistance Equipment used: None Transfers: Bed to chair/wheelchair/BSC       Squat pivot transfers: +2 physical assistance, Min assist     General transfer comment: demonstrated squat  pivot to R and pt performed with A of 2 for safety keeping TDWB on L; also demonstrated Stedy transfer with pt and wife to indicate how to get to [employer] without drop arm and RN aware    Ambulation/Gait                   Stairs             Wheelchair Mobility     Tilt Bed    Modified Rankin (Stroke Patients Only)       Balance Overall balance assessment: Needs assistance Sitting-balance support: Feet supported Sitting balance-Leahy Scale: Good         Standing balance comment: no standing this session                            Cognition Arousal: Alert Behavior During Therapy: WFL for tasks assessed/performed Overall Cognitive Status: Within Functional Limits for tasks assessed                                          Exercises General Exercises - Lower Extremity Ankle Circles/Pumps: AROM, Both, Supine Quad Sets: AROM, 10 reps, Left, Supine Long Arc Quad: AROM, 10 reps, Both, Seated Heel Slides: AROM, Left, 10 reps, Supine Hip ABduction/ADduction: AROM, 10 reps, Left, Supine Straight Leg Raises: AAROM, 10 reps, Left, Supine Other Exercises Other Exercises: armchair pushups x 5 in recliner after demonstration  General Comments General comments (skin integrity, edema, etc.): foam dressing on lateral aspect L LE; issued handout for exercises (TKA)      Pertinent Vitals/Pain Pain Assessment Pain Assessment: Faces Faces Pain Scale: Hurts little more Pain Location: L LE with exercises Pain Descriptors / Indicators: Sore, Burning Pain Intervention(s): Monitored during session, Repositioned, Ice applied    Home Living                          Prior Function            PT Goals (current goals can now be found in the care plan section) Progress towards PT goals: Progressing toward goals    Frequency    Min 1X/week      PT Plan      Co-evaluation              AM-PAC PT "6 Clicks" Mobility    Outcome Measure  Help needed turning from your back to your side while in a flat bed without using bedrails?: A Little Help needed moving from lying on your back to sitting on the side of a flat bed without using bedrails?: A Little Help needed moving to and from a bed to a chair (including a wheelchair)?: Total Help needed standing up from a chair using your arms (e.g., wheelchair or bedside chair)?: Total Help needed to walk in hospital room?: Total Help needed climbing 3-5 steps with a railing? : Total 6 Click Score: 10    End of Session Equipment Utilized During Treatment: Gait belt Activity Tolerance: Patient tolerated treatment well Patient left: in chair;with call bell/phone within reach   PT Visit Diagnosis: Other abnormalities of gait and mobility (R26.89);Muscle weakness (generalized) (M62.81);History of falling (Z91.81)     Time: 4132-4401 PT Time Calculation (min) (ACUTE ONLY): 31 min  Charges:    $Therapeutic Exercise: 8-22 mins $Therapeutic Activity: 8-22 mins PT General Charges $$ ACUTE PT VISIT: 1 Visit                     Justin Hurst, PT Acute Rehabilitation Services [phone] 06/30/2023    Justin Hurst 06/30/2023, 5:44 PM

## 2023-06-30 NOTE — Care Management Important Message (Signed)
Important Message  Patient Details  Name: Justin Hurst MRN: [id number] Date of Birth: 1955/12/08   Important Message Given:  Yes - Medicare IM   Patient has a contact precaution order in place mail copy to the patient home address.   Vinetta Brach 06/30/2023, 2:47 PM

## 2023-06-30 NOTE — Discharge Instructions (Addendum)
Orthopaedic Trauma Service Discharge Instructions   General Discharge Instructions  WEIGHT BEARING STATUS: Touchdown weightbearing left leg   RANGE OF MOTION/ACTIVITY: left knee motion as tolerated, activity as tolerated while maintaining weightbearing restrictions   Bone health:  labs show vitamin d deficiency. Recommend supplementing with vitamin d3 5000 IUs daily   Review the following resource for additional information regarding bone health  [email or web address].cy  Wound Care: daily wound care as needed upon returning home   Discharge Wound Care Instructions  Do NOT apply any ointments, solutions or lotions to pin sites or surgical wounds.  These prevent needed drainage and even though solutions like hydrogen peroxide kill bacteria, they also damage cells lining the pin sites that help fight infection.  Applying lotions or ointments can keep the wounds moist and can cause them to breakdown and open up as well. This can increase the risk for infection. When in doubt call the office.  Surgical incisions should be dressed daily.  If any drainage is noted, use one layer of adaptic or Mepitel, then gauze, Kerlix, and an ace wrap. Alternatively you can use a silicone foam dressing instead of adaptic and gauze. Would still use an ace wrap or a compression sock to help with swelling   NetCamper[employer]cz [email or web address]://dennis-soto.com/?pd_rd_[email or web address]&[email or web address]  http://rojas.com/  These dressing supplies should be available at local medical supply stores ([employer], [employer], etc). They are not usually carried at places like [facility], [facility], [employer], etc  Once the incision is completely dry and without drainage, it may be left open to air out.  Showering may begin 36-48 hours  later.  Cleaning gently with soap and water.  Traumatic wounds should be dressed daily as well.    One layer of adaptic, gauze, Kerlix, then ace wrap.  The adaptic can be discontinued once the draining has ceased    If you have a wet to dry dressing: wet the gauze with saline the squeeze as much saline out so the gauze is moist (not soaking wet), place moistened gauze over wound, then place a dry gauze over the moist one, followed by Kerlix wrap, then ace wrap.  DVT/PE prophylaxis: eliquis 2.5 mg tablet every 12 hours x 30 days   Diet: as you were eating previously.  Can use over the counter stool softeners and bowel preparations, such as Miralax, to help with bowel movements.  Narcotics can be constipating.  Be sure to drink plenty of fluids  PAIN MEDICATION USE AND EXPECTATIONS  You have likely been given narcotic medications to help control your pain.  After a traumatic event that results in an fracture (broken bone) with or without surgery, it is ok to use narcotic pain medications to help control one's pain.  We understand that everyone responds to pain differently and each individual patient will be evaluated on a regular basis for the continued need for narcotic medications. Ideally, narcotic medication use should last no more than 6-8 weeks (coinciding with fracture healing).   As a patient it is your responsibility as well to monitor narcotic medication use and report the amount and frequency you use these medications when you come to your office visit.   We would also advise that if you are using narcotic medications, you should take a dose prior to therapy to maximize you participation.  IF YOU ARE ON NARCOTIC MEDICATIONS IT IS NOT PERMISSIBLE TO OPERATE A MOTOR VEHICLE (MOTORCYCLE/CAR/TRUCK/MOPED) OR HEAVY MACHINERY DO NOT MIX NARCOTICS WITH OTHER CNS (CENTRAL NERVOUS SYSTEM) DEPRESSANTS SUCH AS ALCOHOL  POST-OPERATIVE OPIOID TAPER INSTRUCTIONS: It is important to wean off of your  opioid medication as soon as possible. If you do not need pain medication after your surgery it is ok to stop day one. Opioids include: Codeine, Hydrocodone(Norco, Vicodin), Oxycodone(Percocet, oxycontin) and hydromorphone amongst others.  Long term and even short term use of opiods can cause: Increased pain response Dependence Constipation Depression Respiratory depression And more.  Withdrawal symptoms can include Flu like symptoms Nausea, vomiting And more Techniques to manage these symptoms Hydrate well Eat regular healthy meals Stay active Use relaxation techniques(deep breathing, meditating, yoga) Do Not substitute Alcohol to help with tapering If you have been on opioids for less than two weeks and do not have pain than it is ok to stop all together.  Plan to wean off of opioids This plan should start within one week post op of your fracture surgery  Maintain the same interval or time between taking each dose and first decrease the dose.  Cut the total daily intake of opioids by one tablet each day Next start to increase the time between doses. The last dose that should be eliminated is the evening dose.    STOP SMOKING OR USING NICOTINE PRODUCTS!!!!  As discussed nicotine severely impairs your body's ability to heal surgical and traumatic wounds but also impairs bone healing.  Wounds and bone heal by forming microscopic blood vessels (angiogenesis) and nicotine is a vasoconstrictor (essentially, shrinks blood vessels).  Therefore, if vasoconstriction occurs to these microscopic blood vessels they essentially disappear and are unable to deliver necessary nutrients to the healing tissue.  This is one modifiable factor that you can do to dramatically increase your chances of healing your injury.    (This means no smoking, no nicotine gum, patches, etc)  DO NOT USE NONSTEROIDAL ANTI-INFLAMMATORY DRUGS (NSAID'S)  Using products such as Advil (ibuprofen), Aleve (naproxen), Motrin  (ibuprofen) for additional pain control during fracture healing can delay and/or prevent the healing response.  If you would like to take over the counter (OTC) medication, Tylenol (acetaminophen) is ok.  However, some narcotic medications that are given for pain control contain acetaminophen as well. Therefore, you should not exceed more than 4000 mg of tylenol in a day if you do not have liver disease.  Also note that there are may OTC medicines, such as cold medicines and allergy medicines that my contain tylenol as well.  If you have any questions about medications and/or interactions please ask your doctor/PA or your pharmacist.      ICE AND ELEVATE INJURED/OPERATIVE EXTREMITY  Using ice and elevating the injured extremity above your heart can help with swelling and pain control.  Icing in a pulsatile fashion, such as 20 minutes on and 20 minutes off, can be followed.    Do not place ice directly on skin. Make sure there is a barrier between to skin and the ice pack.    Using frozen items such as frozen peas works well as the conform nicely to the are that needs to be iced.  USE AN ACE WRAP OR TED HOSE FOR SWELLING CONTROL  In addition to icing and elevation, Ace wraps or TED hose are used to help limit and resolve swelling.  It is recommended to use Ace wraps or TED hose until you are informed to stop.    When using Ace Wraps start the wrapping distally (farthest away from the body) and wrap proximally (closer to the body)   Example: If you had surgery  on your leg or thing and you do not have a splint on, start the ace wrap at the toes and work your way up to the thigh        If you had surgery on your upper extremity and do not have a splint on, start the ace wrap at your fingers and work your way up to the upper arm  IF YOU ARE IN A SPLINT OR CAST DO NOT REMOVE IT FOR ANY REASON   If your splint gets wet for any reason please contact the office immediately. You may shower in your splint or  cast as long as you keep it dry.  This can be done by wrapping in a cast cover or garbage back (or similar)  Do Not stick any thing down your splint or cast such as pencils, money, or hangers to try and scratch yourself with.  If you feel itchy take benadryl as prescribed on the bottle for itching  IF YOU ARE IN A CAM BOOT (BLACK BOOT)  You may remove boot periodically. Perform daily dressing changes as noted below.  Wash the liner of the boot regularly and wear a sock when wearing the boot. It is recommended that you sleep in the boot until told otherwise    Call office for the following: Temperature greater than 101F Persistent nausea and vomiting Severe uncontrolled pain Redness, tenderness, or signs of infection (pain, swelling, redness, odor or green/yellow discharge around the site) Difficulty breathing, headache or visual disturbances Hives Persistent dizziness or light-headedness Extreme fatigue Any other questions or concerns you may have after discharge  In an emergency, call 911 or go to an Emergency Department at a nearby hospital  HELPFUL INFORMATION  If you had a block, it will wear off between 8-24 hrs postop typically.  This is period when your pain may go from nearly zero to the pain you would have had postop without the block.  This is an abrupt transition but nothing dangerous is happening.  You may take an extra dose of narcotic when this happens.  You should wean off your narcotic medicines as soon as you are able.  Most patients will be off or using minimal narcotics before their first postop appointment.   We suggest you use the pain medication the first night prior to going to bed, in order to ease any pain when the anesthesia wears off. You should avoid taking pain medications on an empty stomach as it will make you nauseous.  Do not drink alcoholic beverages or take illicit drugs when taking pain medications.  In most states it is against the law to drive  while you are in a splint or sling.  And certainly against the law to drive while taking narcotics.  You may return to work/school in the next couple of days when you feel up to it.   Pain medication may make you constipated.  Below are a few solutions to try in this order: Decrease the amount of pain medication if you aren't having pain. Drink lots of decaffeinated fluids. Drink prune juice and/or each dried prunes  If the first 3 don't work start with additional solutions Take Colace - an over-the-counter stool softener Take Senokot - an over-the-counter laxative Take Miralax - a stronger over-the-counter laxative     CALL THE OFFICE WITH ANY QUESTIONS OR CONCERNS: [phone]   VISIT OUR WEBSITE FOR ADDITIONAL INFORMATION: [email or web address]

## 2023-06-30 NOTE — TOC Progression Note (Addendum)
Transition of Care [facility]) - Progression Note    Patient Details  Name: Justin Hurst MRN: [id number] Date of Birth: January 15, 1956  [facility]) CM/SW Contact  Rogue Rafalski A [place], [place] Phone Number: 06/30/2023, 10:35 AM  Clinical Narrative:     Update 1445 CSW started [place] for [facility].  Auth ID [id number], status pending. Bed available tomorrow. Pt medically stable for DC. DC pending auth approval.   CSW presented bed offers at bedside with pt and pt's spouse, Lupita Leash. They stated they wanted [facility] for placement. Bed offer provided at facility.   They stated possible option for CIR. SNF placement as back up. CSW to complete authorization once plan has been established and medical stability solidified.   TOC will continue to follow.   Expected Discharge Plan: Skilled Nursing Facility Barriers to Discharge: Continued Medical Work up  Expected Discharge Plan and Services       Living arrangements for the past 2 months: Skilled Nursing Facility                                       Social Determinants of Health (SDOH) Interventions SDOH Screenings   Food Insecurity: No Food Insecurity (06/25/2023)  Housing: Low Risk  (06/25/2023)  Transportation Needs: No Transportation Needs (06/25/2023)  Utilities: Not At Risk (06/25/2023)  Tobacco Use: Low Risk  (06/25/2023)    Readmission Risk Interventions     No data to display

## 2023-07-01 ENCOUNTER — Other Ambulatory Visit (HOSPITAL_COMMUNITY): Payer: Self-pay

## 2023-07-01 DIAGNOSIS — M9712XA 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, initial encounter: Secondary | ICD-10-CM | POA: Diagnosis not present

## 2023-07-01 DIAGNOSIS — M1711 Unilateral primary osteoarthritis, right knee: Secondary | ICD-10-CM | POA: Diagnosis not present

## 2023-07-01 DIAGNOSIS — G894 Chronic pain syndrome: Secondary | ICD-10-CM | POA: Diagnosis not present

## 2023-07-01 MED ORDER — OXYCODONE HCL 5 MG PO TABS: 5.0000 mg | ORAL_TABLET | Freq: Four times a day (QID) | ORAL | 0 refills | Status: DC | PRN

## 2023-07-01 MED ORDER — METHOCARBAMOL 500 MG PO TABS: 500.0000 mg | ORAL_TABLET | Freq: Three times a day (TID) | ORAL | 0 refills | Status: DC | PRN

## 2023-07-01 MED ORDER — VITAMIN D (ERGOCALCIFEROL) 1.25 MG (50000 UNIT) PO CAPS
50000.0000 [IU] | ORAL_CAPSULE | ORAL | Status: DC
  Administered 2023-07-01: 50000 [IU] via ORAL
  Filled 2023-07-01: qty 1

## 2023-07-01 MED ORDER — CHLORHEXIDINE GLUCONATE 4 % EX SOLN
1.0000 | CUTANEOUS | 1 refills | Status: DC
  Filled 2023-07-01: qty 948, 42d supply, fill #0

## 2023-07-01 MED ORDER — VITAMIN D (ERGOCALCIFEROL) 1.25 MG (50000 UNIT) PO CAPS: 50000.0000 [IU] | ORAL_CAPSULE | ORAL | Status: DC

## 2023-07-01 MED ORDER — DOCUSATE SODIUM 100 MG PO CAPS: 100.0000 mg | ORAL_CAPSULE | Freq: Two times a day (BID) | ORAL | Status: DC

## 2023-07-01 MED ORDER — APIXABAN 2.5 MG PO TABS: 2.5000 mg | ORAL_TABLET | Freq: Two times a day (BID) | ORAL | 0 refills | Status: DC

## 2023-07-01 MED ORDER — HYDROCORTISONE 1 % EX CREA: TOPICAL_CREAM | Freq: Two times a day (BID) | CUTANEOUS | Status: AC

## 2023-07-01 MED ORDER — DIPHENHYDRAMINE HCL 25 MG PO CAPS: 25.0000 mg | ORAL_CAPSULE | Freq: Four times a day (QID) | ORAL | Status: DC | PRN

## 2023-07-01 MED ORDER — MUPIROCIN 2 % EX OINT
1.0000 | TOPICAL_OINTMENT | Freq: Two times a day (BID) | CUTANEOUS | 0 refills | Status: DC
  Filled 2023-07-01: qty 22, 11d supply, fill #0

## 2023-07-01 MED ORDER — ALPRAZOLAM 1 MG PO TABS: 1.0000 mg | ORAL_TABLET | Freq: Every day | ORAL | 0 refills | Status: AC

## 2023-07-01 MED ORDER — ACETAMINOPHEN 500 MG PO TABS: 1000.0000 mg | ORAL_TABLET | Freq: Three times a day (TID) | ORAL | 0 refills | Status: DC | PRN

## 2023-07-01 MED ORDER — POLYETHYLENE GLYCOL 3350 17 G PO PACK: 17.0000 g | PACK | Freq: Every day | ORAL | Status: DC | PRN

## 2023-07-01 NOTE — Progress Notes (Signed)
Occupational Therapy Treatment Patient Details Name: Justin Hurst MRN: [id number] DOB: [birth year] Today's Date: 07/01/2023   History of present illness 67 yo male admitted 06/25/23 s/p fall with Lt femur fx s/p ORIF 10/3. PMhx: Lt TKA x 3, bil ankle fusions, depression, anxiety, chronic pain, lt femur fx with ORIF 2019   OT comments  Pt presented agreeable to session. Pt completed bed mobility with supervision. Pt then completed sit to stand transfers initially from an elevated surface with mod assist to RW and then was able to progress to transfer from lower surface and with only min assist while this reporting therapist had foot placed under LLE to ensure they are following WB status. Pt was further educated on devices for LB dressing to increase in ability to complete. Patient will benefit from continued inpatient follow up therapy, <3 hours/day      If plan is discharge home, recommend the following:  A lot of help with walking and/or transfers;A lot of help with bathing/dressing/bathroom;Assistance with cooking/housework;Assist for transportation   Equipment Recommendations  Other (comment);Wheelchair (measurements OT);Wheelchair cushion (measurements OT) (drop arm commode)    Recommendations for Other Services      Precautions / Restrictions Precautions Precautions: Fall Required Braces or Orthoses: Other Brace Other Brace: for R knee hinged brace and for stability/comfort as needed Restrictions Weight Bearing Restrictions: Yes LLE Weight Bearing: Touchdown weight bearing       Mobility Bed Mobility Overal bed mobility: Needs Assistance Bed Mobility: Supine to Sit     Supine to sit: Supervision, HOB elevated          Transfers Overall transfer level: Needs assistance Equipment used: Rolling walker (2 wheels) Transfers: Sit to/from Stand Sit to Stand: Min assist, Mod assist   Squat pivot transfers: From elevated surface, Min assist Step pivot transfers: Min  assist, From elevated surface    Lateral/Scoot Transfers: Contact guard assist General transfer comment: Pt on first sit to stand required mod assist but then progressed to min assist. THis reporting therapis thad foot under LLE to ensure following WB status while completion of transfers and mobility     Balance Overall balance assessment: Needs assistance Sitting-balance support: Feet supported Sitting balance-Leahy Scale: Good     Standing balance support: Bilateral upper extremity supported, Reliant on assistive device for balance Standing balance-Leahy Scale: Poor                             ADL either performed or assessed with clinical judgement   ADL Overall ADL's : Needs assistance/impaired Eating/Feeding: Independent;Sitting;Bed level   Grooming: Set up;Sitting;Bed level   Upper Body Bathing: Set up;Sitting   Lower Body Bathing: Maximal assistance;Cueing for sequencing;Cueing for safety;Sitting/lateral leans   Upper Body Dressing : Set up;Sitting;Bed level   Lower Body Dressing: Maximal assistance;Cueing for safety;Cueing for sequencing;Sit to/from stand;Sitting/lateral leans   Toilet Transfer: Rolling walker (2 wheels);Stand-pivot;Minimal assistance   Toileting- Clothing Manipulation and Hygiene: Maximal assistance;Total assistance;Sit to/from stand       Functional mobility during ADLs: Minimal assistance;Moderate assistance;Rolling walker (2 wheels)      Extremity/Trunk Assessment Upper Extremity Assessment Upper Extremity Assessment: Overall WFL for tasks assessed   Lower Extremity Assessment Lower Extremity Assessment: Defer to PT evaluation        Vision   Vision Assessment?: No apparent visual deficits   Perception     Praxis      Cognition Arousal: Alert Behavior During Therapy: [facility] for  tasks assessed/performed Overall Cognitive Status: Within Functional Limits for tasks assessed                         Following  Commands: Follows multi-step commands consistently                Exercises      Shoulder Instructions       General Comments      Pertinent Vitals/ Pain       Pain Assessment Pain Assessment: Faces Faces Pain Scale: Hurts a little bit Pain Location: LLE soreness from exercises Pain Descriptors / Indicators: Sore Pain Intervention(s): Limited activity within patient's tolerance, Monitored during session  Home Living                                          Prior Functioning/Environment              Frequency  Min 1X/week        Progress Toward Goals  OT Goals(current goals can now be found in the care plan section)  Progress towards OT goals: Progressing toward goals  Acute Rehab OT Goals OT Goal Formulation: With patient Time For Goal Achievement: 07/11/23 Potential to Achieve Goals: Good ADL Goals Pt Will Perform Lower Body Bathing: with set-up;with adaptive equipment;sitting/lateral leans Pt Will Perform Lower Body Dressing: with set-up;sitting/lateral leans;with adaptive equipment Pt Will Transfer to Toilet: with min assist;with transfer board;bedside commode Pt Will Perform Toileting - Clothing Manipulation and hygiene: with set-up;sitting/lateral leans Additional ADL Goal #1: Pt will complete bed mobility mod I in preparation for ADLs.  Plan      Co-evaluation                 AM-PAC OT "6 Clicks" Daily Activity     Outcome Measure   Help from another person eating meals?: None Help from another person taking care of personal grooming?: A Little Help from another person toileting, which includes using toliet, bedpan, or urinal?: Total Help from another person bathing (including washing, rinsing, drying)?: A Lot Help from another person to put on and taking off regular upper body clothing?: A Little Help from another person to put on and taking off regular lower body clothing?: Total 6 Click Score: 14    End of  Session Equipment Utilized During Treatment: Gait belt;Rolling walker (2 wheels)  OT Visit Diagnosis: Pain;Muscle weakness (generalized) (M62.81) Pain - Right/Left: Left Pain - part of body: Hip   Activity Tolerance Patient tolerated treatment well   Patient Left in chair;with call bell/phone within reach;with chair alarm set   Nurse Communication Mobility status        Time: 4782-9562 OT Time Calculation (min): 36 min  Charges: OT General Charges $OT Visit: 1 Visit OT Treatments $Self Care/Home Management : 23-37 mins  Presley Raddle OTR/L  Acute Rehab Services  [phone] office number   Alphia Moh 07/01/2023, 12:05 PM

## 2023-07-01 NOTE — TOC Transition Note (Signed)
Transition of Care [facility]) - CM/SW Discharge Note   Patient Details  Name: Justin Hurst MRN: [id number] Date of Birth: 1955-12-09  Transition of Care [facility]) CM/SW Contact:  Ned Kakar A [place], LCSWA Phone Number: 07/01/2023, 1:29 PM   Clinical Narrative:     Patient will DC to: [facility]  Anticipated DC date: 07/01/23  Family notified: Virgel Gess  Transport by: Sharin Mons      Per MD patient ready for DC to [facility] . RN, patient, patient's family, and facility notified of DC. Discharge Summary and FL2 sent to facility. RN to call report prior to discharge ( Room 1202p, [phone] ). DC packet on chart. Ambulance transport requested for patient.     CSW will sign off for now as social work intervention is no longer needed. Please consult [place] again if new needs arise.   Final next level of care: Skilled Nursing Facility Barriers to Discharge: Barriers Resolved   Patient Goals and CMS Choice CMS Medicare.gov Compare Post Acute Care list provided to:: Patient Choice offered to / list presented to : Patient  Discharge Placement                Patient chooses bed at: [facility] Patient to be transferred to facility by: [facility] Name of family member notified: Leotha Voeltz Patient and family notified of of transfer: 07/01/23  Discharge Plan and Services Additional resources added to the After Visit Summary for                                       Social Determinants of Health (SDOH) Interventions SDOH Screenings   Food Insecurity: No Food Insecurity (06/25/2023)  Housing: Low Risk  (06/25/2023)  Transportation Needs: No Transportation Needs (06/25/2023)  Utilities: Not At Risk (06/25/2023)  Tobacco Use: Low Risk  (06/25/2023)     Readmission Risk Interventions     No data to display

## 2023-07-01 NOTE — Progress Notes (Signed)
Orthopaedic Trauma Service Progress Note  Patient ID: Justin Hurst MRN: [id number] DOB/AGE: 01-23-1956 67 y.o.  Subjective:  Doing well Moderate pain in L leg but tolerable R knee not bothering him currently   Would really like to dc home as he is not a candidate for CIR but agreeable to SNF   I fitted and adjusted off-loader brace to R leg, will see how this facilitates mobilization    ROS As above  Objective:   VITALS:   Vitals:   06/30/23 1802 06/30/23 2016 07/01/23 0515 07/01/23 0806  BP: 118/80 (!) 143/91 116/77 121/77  Pulse: 98 85 70 69  Resp: 18  18   Temp: 98.4 F (36.9 C) 98.1 F (36.7 C) 98 F (36.7 C)   TempSrc: Oral Oral Oral   SpO2: 94% 93% 97% 95%  Weight:      Height:        Estimated body mass index is 29.86 kg/m as calculated from the following:   Height as of this encounter: 5\' 6"  (1.676 m).   Weight as of this encounter: 83.9 kg.   Intake/Output      10/07 0701 10/08 0700 10/08 0701 10/09 0700   Urine (mL/kg/hr) 200 (0.1)    Total Output 200    Net -200         Urine Occurrence 1 x    Stool Occurrence 1 x      LABS  No results found for this or any previous visit (from the past 24 hour(s)).   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 06/25/23 1646 06/26/23 1115     .  POD/HD#: 71  67 y/o male s/p fall with L periprosthetic femur fracture around total knee components   -ORIF left periprosthetic femur fracture Weightbearing Touchdown weightbearing left leg               ROM/Activity  Activity as tolerated while maintaining weightbearing restrictions                         Knee range of motion as tolerated               Wound care                         Dressing changes as needed    Ok to leave open to the air    Ok to shower and clean with soap and water only                Ice and elevate for swelling and pain control               Therapy evaluations   -End-stage DJD right knee            Off loader brace  Only needs to be worn when mobilizing but can certainly wear as lon as he likes    - Pain management:             Multimodal   - ABL anemia/Hemodynamics             As above             Monitor   - Medical issues              Per primary    - DVT/PE prophylaxis:             Lovenox              Recommend DOAC at dc x 30 days----> eliquis 2.5 mg BID  - ID:              Periop abx completed    - Metabolic Bone Disease:             Vitamin d deficiency                          supplement - Activity:             As above   - Impediments to fracture healing:             Vitamin d deficiency               - Dispo:             Ortho issue stable             Stable for dc from ortho standpoint  Follow up with ortho in 10 days for xrays and suture removal                Mearl Latin, PA-C [phone] (C) 07/01/2023, 9:32 AM  [facility] [address] [place] [address] [phone] [phone]  (F)    After 5pm and on the weekends please log on to Amion, go to orthopaedics and the look under the [facility] Call for the provider(s) on call. You can also call our office at [phone] and then follow the prompts to be connected to the call team.  Patient ID: Justin Hurst, male   DOB: Dec 21, 1955, 67 y.o.   MRN: [id number]

## 2023-07-01 NOTE — Discharge Summary (Signed)
Physician Discharge Summary  MIECZYSLAW ZAPPA KGM:[id number] DOB: [birth year] DOA: 06/25/2023  PCP: [facility]  Admit date: 06/25/2023 Discharge date: 07/01/2023  Admitted From: Home  Discharge disposition: Skilled nursing facility   Recommendations for Outpatient Follow-Up:   Follow up with your primary care provider in one week.  Check CBC, BMP, magnesium in the next visit Follow-up with orthopedics Dr. Carola Frost in 10 days for x-rays and wound checkup. Touchdown weightbearing.  Change dressing as needed and okay to leave open to air and okay to shower.   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  Diet recommendation: Regular.  Wound care: Reinforce dressing.  Code status: Full.   History of Present Illness:   Justin Hurst is a 67 y.o. male with PMH significant for depression, anxiety, left knee replacement x 3 presented  to the hospital after mechanical fall .He had tripped and fell.  In the ED, he was hemodynamically stable.  Labs were within normal range except for WBC at 14.4.  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  Patient was then considered for admission to the hospital for further evaluation and treatment.   Hospital Course:   Following conditions were addressed during hospitalization as listed below,  Left periprosthetic distal femur fracture status post ORIF with plate insertion on 06/26/2023.  CT scan of the knee on the left showed acute periprosthetic spiral fracture extending from proximal to distal aspect of the left femoral stem prosthesis.  Orthopedics was consulted and patient underwent  ORIF with plate insertion on 06/26/2023.  At this time, recommendation is  touchdown weightbearing and unrestricted range of motion of the knee.  Full pharmacological prophylaxis with Eliquis will be provided on discharge..  Follow-up in 10 days for removal of sutures. PT OT has assessed the patient and recommended skilled nursing facility.  Pain has been reasonably controlled.   Leukocytosis: Likely reactive.  CBC normalized at this time.  Hemoglobin of 10.9.   Anxiety/depression Continue Zoloft and alprazolam.   Itchy rash on the back.  Likely sweat rash.  Continue hydrocortisone and Benadryl on discharge as needed.  Elevated blood pressure: Likely secondary to pain.  Improved.  Not on antihypertensives at home.  Vitamin D deficiency.  Will give vitamin D 50,000 unit weekly for next 5 weeks.  Would recommend continuation of vitamin D as outpatient.   Disposition.  At this time, patient is stable for disposition to skilled nursing facility with outpatient orthopedics follow-up.  Medical Consultants:   Orthopedics  Procedures:    ORIF with plate insertion 06/26/2023 by orthopedics.  Subjective:   Today, patient was seen and examined at bedside.  Overall feels better.  Pain under control.  Discharge Exam:   Vitals:   07/01/23 0515 07/01/23 0806  BP: 116/77 121/77  Pulse: 70 69  Resp: 18   Temp: 98 F (36.7 C)   SpO2: 97% 95%   Vitals:   06/30/23 1802 06/30/23 2016 07/01/23 0515 07/01/23 0806  BP: 118/80 (!) 143/91 116/77 121/77  Pulse: 98 85 70 69  Resp: 18  18   Temp: 98.4 F (36.9 C) 98.1 F (36.7 C) 98 F (36.7 C)   TempSrc:  Oral Oral Oral   SpO2: 94% 93% 97% 95%  Weight:      Height:       Body mass index is 29.86 kg/m.  General: Alert awake, not in obvious distress HENT: pupils equally reacting to light,  No scleral pallor or icterus noted. Oral mucosa is moist.  Chest:  Clear breath sounds.  Diminished breath sounds bilaterally. No crackles or wheezes.   CVS: S1 &S2 heard. No murmur.  Regular rate and rhythm. Abdomen: Soft, nontender, nondistended.  Bowel sounds are heard.   Extremities: No cyanosis, clubbing.  Left lower extremity status post ORIF covered with Ace wrap. Psych: Alert, awake and oriented, normal mood CNS:  No cranial nerve deficits.  Power equal in all extremities.   Skin: Warm and dry.  Mild maculopapular eruption on the back only  The results of significant diagnostics from this hospitalization (including imaging, microbiology, ancillary and laboratory) are listed below for reference.     Diagnostic Studies:   DG FEMUR PORT MIN 2 VIEWS LEFT  Result Date: 06/26/2023 CLINICAL DATA:  Fracture, postop. EXAM: LEFT FEMUR PORTABLE 2 VIEWS COMPARISON:  Preoperative imaging. FINDINGS: Lateral plate and multi screw fixation of femoral shaft fracture. Previous revision knee arthroplasty with cerclage wire fixation. Recent postsurgical change includes air and edema in the soft tissues. IMPRESSION: ORIF of femoral shaft fracture without immediate postoperative complication. Electronically Signed   By: Narda Rutherford M.D.   On: 06/26/2023 17:20   DG FEMUR MIN 2 VIEWS LEFT  Result Date: 06/26/2023 CLINICAL DATA:  Elective surgery. EXAM: LEFT FEMUR 2 VIEWS COMPARISON:  Preoperative radiographs. FINDINGS: Twenty-one fluoroscopic spot views of the left femur obtained in the operating room. Sequential images during lateral plate and screw fixation of periprosthetic femur fracture. Fluoroscopy time 1 minutes 53 seconds. Dose 34.8 mGy. IMPRESSION: Intraoperative fluoroscopy during periprosthetic femur fracture fixation. Electronically Signed   By: Narda Rutherford M.D.   On: 06/26/2023 16:35   DG C-Arm 1-60 Min-No Report  Result Date: 06/26/2023 Fluoroscopy was utilized by the requesting physician.  No radiographic interpretation.   DG C-Arm 1-60 Min-No Report  Result Date: 06/26/2023 Fluoroscopy was utilized by the requesting physician.   No radiographic interpretation.   DG C-Arm 1-60 Min-No Report  Result Date: 06/26/2023 Fluoroscopy was utilized by the requesting physician.  No radiographic interpretation.   CT Knee Left Wo Contrast  Result Date: 06/25/2023 CLINICAL DATA:  Knee trauma.  Fall. EXAM: CT OF THE LOWER LEFT EXTREMITY WITHOUT CONTRAST TECHNIQUE: Multidetector CT imaging of the lower left extremity was performed according to the standard protocol. RADIATION DOSE REDUCTION: This exam was performed according to the departmental dose-optimization program which includes automated exposure control, adjustment of the mA and/or kV according to patient size and/or use of iterative reconstruction technique. COMPARISON:  Same day left femur radiographs at 8:41 a.m. FINDINGS: Bones/Joint/Cartilage Postsurgical changes related to left total knee arthroplasty with long femoral stem component. There is an acute periprosthetic spiral fracture extending from the proximal through the distal femoral stem component. The proximal margin of the fracture extends through the lateral mid femoral cortical diaphysis at the level of the proximal tip of the femoral stem component with approximately 6 mm of diastasis. Additional cortical breakthrough is noted along the medial mid femoral diaphyseal cortex as well as the lateral distal metadiaphyseal femoral cortex, at the level of the most proximal cerclage wire. There is approximately 4 mm of cortical step-off along the posteromedial mid femoral diaphysis. Four distal metaphyseal cerclage wires  are intact. There is essentially near complete loss of cortical bone overlying the anteromedial aspect of the distal femoral stem component extending to the level of the proximal femoral condylar prosthetic component, measuring approximately 6 cm in craniocaudal dimension (knee series 5, image 63). There is periprosthetic lucency surrounding the deep aspect of the tibial tray prosthesis component, most pronounced  anteriorly and medially (knee series 5, image 60). No additional fracture identified. There is a knee joint effusion. 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 Ligaments Suboptimally assessed by CT. Soft tissues/Muscles There is mild soft tissue swelling overlying the distal femur. Edema is noted within the quadriceps musculature surrounding the fracture, described above. No intramuscular collection. IMPRESSION: 1. Left total knee arthroplasty with acute periprosthetic spiral fracture extending from the proximal to the distal aspects of the left femoral stem prosthesis component. There is mild diastasis proximally and cortical step-off at the posterior mid femoral diaphysis. 2. Near complete loss of cortical bone overlying the anteromedial aspect of the distal femoral stem component. 3. Periprosthetic lucency surrounding the deep aspect of the tibial prosthesis component, most pronounced anteriorly and medially. These findings are concerning for loosening. Electronically Signed   By: Hart Robinsons M.D.   On: 06/25/2023 16:20   CT FEMUR LEFT WO CONTRAST  Result Date: 06/25/2023 CLINICAL DATA:  Knee trauma.  Fall. EXAM: CT OF THE LOWER LEFT EXTREMITY WITHOUT CONTRAST TECHNIQUE: Multidetector CT imaging of the lower left extremity was performed according to the standard protocol. RADIATION DOSE REDUCTION: This exam was performed according to the departmental dose-optimization program which includes automated exposure control, adjustment of the mA and/or kV according to patient size and/or use of iterative reconstruction technique. COMPARISON:  Same day left femur radiographs at 8:41 a.m. FINDINGS: Bones/Joint/Cartilage Postsurgical changes related to left total knee arthroplasty with long femoral stem component. There is an acute periprosthetic spiral fracture extending from the proximal through the distal femoral stem component. The proximal margin of the fracture extends through the lateral  mid femoral cortical diaphysis at the level of the proximal tip of the femoral stem component with approximately 6 mm of diastasis. Additional cortical breakthrough is noted along the medial mid femoral diaphyseal cortex as well as the lateral distal metadiaphyseal femoral cortex, at the level of the most proximal cerclage wire. There is approximately 4 mm of cortical step-off along the posteromedial mid femoral diaphysis. Four distal metaphyseal cerclage wires are intact. There is essentially near complete loss of cortical bone overlying the anteromedial aspect of the distal femoral stem component extending to the level of the proximal femoral condylar prosthetic component, measuring approximately 6 cm in craniocaudal dimension (knee series 5, image 63). There is periprosthetic lucency surrounding the deep aspect of the tibial tray prosthesis component, most pronounced anteriorly and medially (knee series 5, image 60). No additional fracture identified. There is a knee joint effusion. 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 Ligaments Suboptimally assessed by CT. Soft tissues/Muscles There is mild soft tissue swelling overlying the distal femur. Edema is noted within the quadriceps musculature surrounding the fracture, described above. No intramuscular collection. IMPRESSION: 1. Left total knee arthroplasty with acute periprosthetic spiral fracture extending from the proximal to the distal aspects of the left femoral stem prosthesis component. There is mild diastasis proximally and cortical step-off at the posterior mid femoral diaphysis. 2. Near complete loss of cortical bone overlying the anteromedial aspect of the distal femoral stem component. 3. Periprosthetic lucency surrounding the deep aspect of the tibial prosthesis  component, most pronounced anteriorly and medially. These findings are concerning for loosening. Electronically Signed   By: Hart Robinsons M.D.   On: 06/25/2023 16:20    DG FEMUR MIN 2 VIEWS LEFT  Result Date: 06/25/2023 CLINICAL DATA:  Fall.  Left hip pain. EXAM: LEFT FEMUR 2 VIEWS; PORTABLE PELVIS 1-2 VIEWS COMPARISON:  Left knee radiographs 04/05/2023 FINDINGS: Pelvis: Mildly decreased bone mineralization. Mild pubic symphysis joint space narrowing with mild subchondral sclerosis and mild superior osteophytosis. Mild-to-moderate left and mild right superomedial femoroacetabular joint space narrowing. Moderate left L3-4 and mild left L4-5 disc space narrowing with moderate left L3-4 and L4-5 left endplate osteophytes. Left femur: Postsurgical changes are seen of total left knee arthroplasty with long femoral stem component, similar to 06/25/2023. There is an acute periprosthetic spiral fracture around the proximal, mid, and distal portions of the left femoral stem prosthesis component. This fracture extends through the lateral cortex of the mid femoral diaphysis just lateral to the proximal tip of the femoral stem with approximately 5 mm cortical step-off. There is up to approximately 5 mm AP dimension diastasis of a more distal spiral fracture. There are again multiple distal femoral metadiaphyseal cerclage wires. There is again high-grade bone thinning at the distal medial aspect of the femoral prosthesis, greatest at the metaphysis. There are again partially visualized lucencies around the deep aspect of the tibial tray prosthesis component. IMPRESSION: Redemonstration of total left knee arthroplasty with long femoral stem component. Acute periprosthetic spiral fracture around the proximal, mid, and distal portions of the left femoral stem prosthesis component. Electronically Signed   By: Neita Garnet M.D.   On: 06/25/2023 09:30   DG Pelvis Portable  Result Date: 06/25/2023 CLINICAL DATA:  Fall.  Left hip pain. EXAM: LEFT FEMUR 2 VIEWS; PORTABLE PELVIS 1-2 VIEWS COMPARISON:  Left knee radiographs 04/05/2023 FINDINGS: Pelvis: Mildly decreased bone mineralization. Mild  pubic symphysis joint space narrowing with mild subchondral sclerosis and mild superior osteophytosis. Mild-to-moderate left and mild right superomedial femoroacetabular joint space narrowing. Moderate left L3-4 and mild left L4-5 disc space narrowing with moderate left L3-4 and L4-5 left endplate osteophytes. Left femur: Postsurgical changes are seen of total left knee arthroplasty with long femoral stem component, similar to 06/25/2023. There is an acute periprosthetic spiral fracture around the proximal, mid, and distal portions of the left femoral stem prosthesis component. This fracture extends through the lateral cortex of the mid femoral diaphysis just lateral to the proximal tip of the femoral stem with approximately 5 mm cortical step-off. There is up to approximately 5 mm AP dimension diastasis of a more distal spiral fracture. There are again multiple distal femoral metadiaphyseal cerclage wires. There is again high-grade bone thinning at the distal medial aspect of the femoral prosthesis, greatest at the metaphysis. There are again partially visualized lucencies around the deep aspect of the tibial tray prosthesis component. IMPRESSION: Redemonstration of total left knee arthroplasty with long femoral stem component. Acute periprosthetic spiral fracture around the proximal, mid, and distal portions of the left femoral stem prosthesis component. Electronically Signed   By: Neita Garnet M.D.   On: 06/25/2023 09:30     Labs:   Basic Metabolic Panel: Recent Labs  Lab 06/25/23 0750 06/25/23 1702 06/26/23 0516 06/27/23 0618 06/28/23 0624 06/30/23 0929  NA 137  --  135 135 137 137  K 4.1  --  3.5 3.5 4.0 3.7  CL 104  --  100 101 103 101  CO2 25  --  26  26 29 25   GLUCOSE 111*  --  102* 119* 97 103*  BUN 22  --  25* 15 13 16   CREATININE 0.88 1.01 0.96 0.99 1.02 1.11  CALCIUM 8.8*  --  8.7* 8.3* 8.6* 9.0  MG  --   --  2.1 2.2 2.1 2.0  PHOS  --   --  2.7  --   --   --    GFR Estimated  Creatinine Clearance: 65.6 mL/min (by C-G formula based on SCr of 1.11 mg/dL). Liver Function Tests: Recent Labs  Lab 06/26/23 0516  AST 24  ALT 27  ALKPHOS 91  BILITOT 0.8  PROT 6.7  ALBUMIN 3.4*   No results for input(s): "LIPASE", "AMYLASE" in the last 168 hours. No results for input(s): "AMMONIA" in the last 168 hours. Coagulation profile No results for input(s): "INR", "PROTIME" in the last 168 hours.  CBC: Recent Labs  Lab 06/25/23 0750 06/25/23 1702 06/26/23 0516 06/27/23 0618 06/28/23 0624 06/30/23 0929  WBC 14.5* 10.5 8.7 9.2 7.5 7.2  NEUTROABS 12.5*  --   --   --   --   --   HGB 14.5 14.5 13.1 11.5* 12.0* 10.9*  HCT 43.6 43.1 39.8 34.5* 36.5* 32.9*  MCV 94.6 91.5 92.8 93.2 95.8 93.2  PLT 252 250 218 209 227 276   Cardiac Enzymes: No results for input(s): "CKTOTAL", "CKMB", "CKMBINDEX", "TROPONINI" in the last 168 hours. BNP: Invalid input(s): "POCBNP" CBG: No results for input(s): "GLUCAP" in the last 168 hours. D-Dimer No results for input(s): "DDIMER" in the last 72 hours. Hgb A1c No results for input(s): "HGBA1C" in the last 72 hours. Lipid Profile No results for input(s): "CHOL", "HDL", "LDLCALC", "TRIG", "CHOLHDL", "LDLDIRECT" in the last 72 hours. Thyroid function studies No results for input(s): "TSH", "T4TOTAL", "T3FREE", "THYROIDAB" in the last 72 hours.  Invalid input(s): "FREET3" Anemia work up No results for input(s): "VITAMINB12", "FOLATE", "FERRITIN", "TIBC", "IRON", "RETICCTPCT" in the last 72 hours. Microbiology Recent Results (from the past 240 hour(s))  Surgical pcr screen     Status: Abnormal   Collection Time: 06/26/23 10:16 AM   Specimen: Nasal Mucosa; Nasal Swab  Result Value Ref Range Status   MRSA, PCR POSITIVE (A) NEGATIVE Final    Comment: RESULT CALLED TO, READ BACK BY AND VERIFIED WITH: [id number] AT 1238 RN MARY NEAL, ADC    Staphylococcus aureus POSITIVE (A) NEGATIVE Final    Comment: (NOTE) The Xpert SA Assay (FDA  approved for NASAL specimens in patients 46 years of age and older), is one component of a comprehensive surveillance program. It is not intended to diagnose infection nor to guide or monitor treatment. Performed at [facility], [address], [place], [place] [address]      Discharge Instructions:   Discharge Instructions     Call MD for:  redness, tenderness, or signs of infection (pain, swelling, redness, odor or green/yellow discharge around incision site)   Complete by: As directed    Call MD for:  severe uncontrolled pain   Complete by: As directed    Call MD for:  temperature >100.4   Complete by: As directed    Diet general   Complete by: As directed    Discharge instructions   Complete by: As directed    Follow-up with your primary care provider at the skilled nursing facility in 3 to 5 days.  Check blood work at that time.  Follow-up with orthopedics in clinic in 10 days for  x-rays and follow-up.Marland Kitchen   Discharge wound care:   Complete by: As directed    Reinforce dressing.   Increase activity slowly   Complete by: As directed       Allergies as of 07/01/2023   No Known Allergies      Medication List     STOP taking these medications    GOODYS EXTRA STRENGTH PO   ibuprofen 200 MG tablet Commonly known as: ADVIL       TAKE these medications    acetaminophen 500 MG tablet Commonly known as: TYLENOL Take 2 tablets (1,000 mg total) by mouth every 8 (eight) hours as needed for mild pain or moderate pain.   ALPRAZolam 1 MG tablet Commonly known as: XANAX Take 1 tablet (1 mg total) by mouth at bedtime.   apixaban 2.5 MG Tabs tablet Commonly known as: Eliquis Take 1 tablet (2.5 mg total) by mouth 2 (two) times daily.   chlorhexidine 4 % external liquid Commonly known as: HIBICLENS Apply 15 mLs (1 Application total) topically as directed for 30 doses. Use as directed daily for 5 days every other week for 6 weeks.   diphenhydrAMINE 25 mg  capsule Commonly known as: BENADRYL Take 1 capsule (25 mg total) by mouth every 6 (six) hours as needed for itching or allergies.   docusate sodium 100 MG capsule Commonly known as: COLACE Take 1 capsule (100 mg total) by mouth 2 (two) times daily.   hydrocortisone cream 1 % Apply topically 2 (two) times daily for 5 days. Back rash   methocarbamol 500 MG tablet Commonly known as: ROBAXIN Take 1-2 tablets (500-1,000 mg total) by mouth every 8 (eight) hours as needed for muscle spasms. What changed:  how much to take when to take this reasons to take this   mupirocin ointment 2 % Commonly known as: BACTROBAN Place 1 Application into the nose 2 (two) times daily for 60 doses. Use as directed 2 times daily for 5 days every other week for 6 weeks.   mupirocin ointment 2 % Commonly known as: BACTROBAN Place 1 Application into the nose 2 (two) times daily.   oxyCODONE 5 MG immediate release tablet Commonly known as: Oxy IR/ROXICODONE Take 1-2 tablets (5-10 mg total) by mouth every 6 (six) hours as needed for moderate pain or severe pain.   polyethylene glycol 17 g packet Commonly known as: MIRALAX / GLYCOLAX Take 17 g by mouth daily as needed for mild constipation.   sertraline 100 MG tablet Commonly known as: ZOLOFT Take 100 mg by mouth every evening.   TUMS PO Take 1 tablet by mouth as needed (Acid Reflux).   Vitamin D (Ergocalciferol) 1.25 MG (50000 UNIT) Caps capsule Commonly known as: DRISDOL Take 1 capsule (50,000 Units total) by mouth every 7 (seven) days.               Discharge Care Instructions  (From admission, onward)           Start     Ordered   07/01/23 0000  Discharge wound care:       Comments: Reinforce dressing.   07/01/23 1017            Contact information for follow-up providers     Myrene Galas, MD. Schedule an appointment as soon as possible for a visit in 10 day(s).   Specialty: Orthopedic Surgery Contact  information: [address] [place] [address] [phone]  Contact information for after-discharge care     Destination     [facility] Preferred SNF .   Service: Skilled Nursing Contact information: [address] [place] [address] [phone]                      Time coordinating discharge: 39 minutes  Signed:  Adarius Tigges  [facility] 07/01/2023, 10:19 AM

## 2023-07-02 ENCOUNTER — Encounter (HOSPITAL_COMMUNITY): Payer: Self-pay | Admitting: Orthopedic Surgery

## 2023-10-01 DIAGNOSIS — M9712XD 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, subsequent encounter: Secondary | ICD-10-CM | POA: Diagnosis not present

## 2023-10-01 DIAGNOSIS — S72452D Displaced supracondylar fracture without intracondylar extension of lower end of left femur, subsequent encounter for closed fracture with routine healing: Secondary | ICD-10-CM | POA: Diagnosis not present

## 2023-10-21 DIAGNOSIS — M1711 Unilateral primary osteoarthritis, right knee: Secondary | ICD-10-CM | POA: Diagnosis not present

## 2023-11-04 DIAGNOSIS — Z01818 Encounter for other preprocedural examination: Secondary | ICD-10-CM | POA: Diagnosis not present

## 2023-11-04 DIAGNOSIS — M1711 Unilateral primary osteoarthritis, right knee: Secondary | ICD-10-CM | POA: Diagnosis not present

## 2023-11-04 DIAGNOSIS — E7800 Pure hypercholesterolemia, unspecified: Secondary | ICD-10-CM | POA: Diagnosis not present

## 2023-11-20 ENCOUNTER — Encounter (HOSPITAL_BASED_OUTPATIENT_CLINIC_OR_DEPARTMENT_OTHER): Payer: Self-pay

## 2023-11-20 ENCOUNTER — Emergency Department (HOSPITAL_BASED_OUTPATIENT_CLINIC_OR_DEPARTMENT_OTHER)
Admission: EM | Admit: 2023-11-20 | Discharge: 2023-11-20 | Disposition: A | Discharge status: Home / Self Care | Payer: Medicare Other | Attending: Emergency Medicine | Admitting: Emergency Medicine

## 2023-11-20 DIAGNOSIS — Z86011 Personal history of benign neoplasm of the brain: Secondary | ICD-10-CM | POA: Diagnosis not present

## 2023-11-20 DIAGNOSIS — W06XXXA Fall from bed, initial encounter: Secondary | ICD-10-CM | POA: Diagnosis not present

## 2023-11-20 DIAGNOSIS — Z7901 Long term (current) use of anticoagulants: Secondary | ICD-10-CM | POA: Diagnosis not present

## 2023-11-20 DIAGNOSIS — S0181XA Laceration without foreign body of other part of head, initial encounter: Secondary | ICD-10-CM | POA: Diagnosis not present

## 2023-11-20 DIAGNOSIS — W19XXXA Unspecified fall, initial encounter: Secondary | ICD-10-CM

## 2023-11-20 DIAGNOSIS — S0083XA Contusion of other part of head, initial encounter: Secondary | ICD-10-CM | POA: Diagnosis not present

## 2023-11-20 DIAGNOSIS — S0993XA Unspecified injury of face, initial encounter: Secondary | ICD-10-CM | POA: Diagnosis present

## 2023-11-20 DIAGNOSIS — S01412A Laceration without foreign body of left cheek and temporomandibular area, initial encounter: Secondary | ICD-10-CM | POA: Diagnosis not present

## 2023-11-20 MED ORDER — ACETAMINOPHEN 500 MG PO TABS
1000.0000 mg | ORAL_TABLET | Freq: Once | ORAL | Status: AC
  Administered 2023-11-20: 1000 mg via ORAL
  Filled 2023-11-20: qty 2

## 2023-11-20 NOTE — ED Provider Notes (Signed)
 [place] EMERGENCY DEPARTMENT AT [facility] Provider Note   CSN: [id number] Arrival date & time: 11/20/23  1102     History  Chief Complaint  Patient presents with   Fall   Facial Injury    Justin Hurst is a 68 y.o. male.  HPI Patient reports that he was sound asleep and rolled out of bed.  He reports he has a set of stairs next to his bed and hit his left stair device and a cut to his cheek.  Patient reports he immediately woke up.  He denies he had loss of consciousness.  He has not had any confusion.  The anticoagulants.  He reports he has some and had some headache at the time of the injury but now does not have significant headache.  No visual changes.  Gait has been at baseline.  No focal weakness numbness or tingling.  No neck pain.  Patient has history of chordoma brain tumor.  He is due for an MRI and follow-up.    Home Medications Prior to Admission medications   Medication Sig Start Date End Date Taking? Authorizing Provider  acetaminophen (TYLENOL) 500 MG tablet Take 2 tablets (1,000 mg total) by mouth every 8 (eight) hours as needed for mild pain or moderate pain. 07/01/23   Montez Morita, PA-C  ALPRAZolam Prudy Feeler) 1 MG tablet Take 1 tablet (1 mg total) by mouth at bedtime. 07/01/23 06/30/24  Pokhrel, Rebekah Chesterfield, MD  apixaban (ELIQUIS) 2.5 MG TABS tablet Take 1 tablet (2.5 mg total) by mouth 2 (two) times daily. 07/01/23 07/31/23  Montez Morita, PA-C  Calcium Carbonate Antacid (TUMS PO) Take 1 tablet by mouth as needed (Acid Reflux).    [provider]  chlorhexidine (HIBICLENS) 4 % external liquid Apply 15 mLs (1 Application total) topically as directed for 30 doses. Use as directed daily for 5 days every other week for 6 weeks. 07/01/23   Montez Morita, PA-C  diphenhydrAMINE (BENADRYL) 25 mg capsule Take 1 capsule (25 mg total) by mouth every 6 (six) hours as needed for itching or allergies. 07/01/23   Pokhrel, Rebekah Chesterfield, MD  docusate sodium (COLACE) 100 MG capsule  Take 1 capsule (100 mg total) by mouth 2 (two) times daily. 07/01/23   Montez Morita, PA-C  methocarbamol (ROBAXIN) 500 MG tablet Take 1-2 tablets (500-1,000 mg total) by mouth every 8 (eight) hours as needed for muscle spasms. 07/01/23   Montez Morita, PA-C  mupirocin ointment (BACTROBAN) 2 % Place 1 Application into the nose 2 (two) times daily. 07/01/23   Montez Morita, PA-C  oxyCODONE (OXY IR/ROXICODONE) 5 MG immediate release tablet Take 1-2 tablets (5-10 mg total) by mouth every 6 (six) hours as needed for moderate pain or severe pain. 07/01/23   Montez Morita, PA-C  polyethylene glycol (MIRALAX / GLYCOLAX) 17 g packet Take 17 g by mouth daily as needed for mild constipation. 07/01/23   Pokhrel, Rebekah Chesterfield, MD  sertraline (ZOLOFT) 100 MG tablet Take 100 mg by mouth every evening.     [provider]  Vitamin D, Ergocalciferol, (DRISDOL) 1.25 MG (50000 UNIT) CAPS capsule Take 1 capsule (50,000 Units total) by mouth every 7 (seven) days. 07/01/23   Pokhrel, Rebekah Chesterfield, MD      Allergies    Patient has no known allergies.    Review of Systems   Review of Systems  Physical Exam Updated Vital Signs BP 129/88 (BP Location: Right Arm)   Pulse 63   Temp 98.9 F (37.2 C)  Resp 16   SpO2 96%  Physical Exam Constitutional:      Comments: Alert nontoxic.  Well-nourished well-developed.  No acute distress.  HENT:     Head:     Comments: Patient has ecchymotic hematoma to the left zygoma.  There is a approximately 8 mm, shallow laceration without significant gaping.  No active bleeding.  No swelling of the entire periorbital area.    Left Ear: Tympanic membrane normal.     Mouth/Throat:     Mouth: Mucous membranes are moist.     Pharynx: Oropharynx is clear.     Comments: Dentition in good condition.  No dental fracture.  Normal range of motion of the jaw. Eyes:     Extraocular Movements: Extraocular movements intact.     Conjunctiva/sclera: Conjunctivae normal.     Pupils: Pupils are equal, round,  and reactive to light.     Comments: Patient has a small laceration on the zygoma that is just outside of the corner of the eye.  This is not contiguous with the eye.  There is no diffuse periorbital swelling at this time.  Extraocular motions are normal.  No signs of entrapment.  Neck:     Comments: No midline C-spine tenderness. Cardiovascular:     Rate and Rhythm: Normal rate and regular rhythm.  Pulmonary:     Effort: Pulmonary effort is normal.     Breath sounds: Normal breath sounds.  Chest:     Chest wall: No tenderness.  Abdominal:     General: There is no distension.     Palpations: Abdomen is soft.     Tenderness: There is no abdominal tenderness.  Musculoskeletal:        General: No deformity.     Cervical back: Neck supple.     Comments: Patient had a previous femur repair on the left.  No acute extremity injuries.  Skin:    General: Skin is warm and dry.  Neurological:     Comments: Patient is alert.  Speech is clear.  No focal motor deficits.  Psychiatric:        Mood and Affect: Mood normal.     ED Results / Procedures / Treatments   Labs (all labs ordered are listed, but only abnormal results are displayed) Labs Reviewed - No data to display  EKG None  Radiology No results found.  Procedures .Laceration Repair  Date/Time: 11/20/2023 2:05 PM  Performed by: Arby Barrette, MD Authorized by: Arby Barrette, MD   Consent:    Consent obtained:  Verbal   Consent given by:  Patient Laceration details:    Location:  Face   Face location:  L cheek   Length (cm):  0.8   Depth (mm):  2 Exploration:    Hemostasis achieved with:  Direct pressure   Contaminated: no   Treatment:    Area cleansed with:  Shur-Clens   Amount of cleaning:  Standard Skin repair:    Repair method:  Tissue adhesive Approximation:    Approximation:  Close Repair type:    Repair type:  Simple     Medications Ordered in ED Medications  acetaminophen (TYLENOL) tablet  1,000 mg (has no administration in time range)    ED Course/ Medical Decision Making/ A&P                                 Medical Decision Making  Patient presents as outlined.  He has focal ecchymosis and a minor laceration to the left zygoma.  Facial exam does not suggest any significant fracture.  Normal range of motion of jaw.  Normal extraocular motions.  No crepitus or appearance of displacement around the zygoma.  Dentition is normal and intact.  Patient is not on anticoagulant.  He did not have loss of consciousness.  Patient does not have any signs of confusion or persisting severe headache.  At this time I felt it was not critical that the patient get CT head.  I had long discussion with the patient and his wife at bedside.  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  He gets regular monitoring with MRI.  He is due soon for his MRI.  Family was hoping for an MRI here however there is no MRI available.  We discussed the limitations and the expected utility of doing a CT scan.  At this time with consideration of prior radiation exposure through imaging,  and lower probability for intracranial hemorrhage.Patient and his wife decline CT at this time.  They will be getting scheduled for MRI soon.  We reviewed return precautions for head injury and they are comfortable observing at home and returning if there should be any concerning changes.        Final Clinical Impression(s) / ED Diagnoses Final diagnoses:  Fall, initial encounter  Contusion of face, initial encounter  Facial laceration, initial encounter    Rx / DC Orders ED Discharge Orders     None         Arby Barrette, MD 11/20/23 1411

## 2023-11-20 NOTE — Discharge Instructions (Addendum)
 1.  You may lightly rinse over your wound.  Pat dry.  Do not apply any antibiotic ointment to the Dermabond. 2.  Follow head injury instructions.  Return immediately if you have any confusion, severe worsening headache, problems with your vision, vomiting or other concerning changes.

## 2023-11-20 NOTE — ED Triage Notes (Signed)
 Pt states he was sleeping, "must've been deep in a dream, rolled out of bed & hit something." Hx brain cancer, visitor states he's "due for an MRI anyway, if we can get one."

## 2023-11-21 ENCOUNTER — Other Ambulatory Visit (HOSPITAL_BASED_OUTPATIENT_CLINIC_OR_DEPARTMENT_OTHER): Payer: Self-pay | Admitting: Family Medicine

## 2023-11-21 DIAGNOSIS — C412 Malignant neoplasm of vertebral column: Secondary | ICD-10-CM

## 2023-11-24 ENCOUNTER — Other Ambulatory Visit: Payer: Self-pay | Admitting: *Deleted

## 2023-11-24 DIAGNOSIS — C412 Malignant neoplasm of vertebral column: Secondary | ICD-10-CM

## 2023-11-26 DIAGNOSIS — M9712XD 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, subsequent encounter: Secondary | ICD-10-CM | POA: Diagnosis not present

## 2023-11-26 DIAGNOSIS — S72452D Displaced supracondylar fracture without intracondylar extension of lower end of left femur, subsequent encounter for closed fracture with routine healing: Secondary | ICD-10-CM | POA: Diagnosis not present

## 2023-11-29 ENCOUNTER — Ambulatory Visit
Admission: RE | Admit: 2023-11-29 | Discharge: 2023-11-29 | Disposition: A | Discharge status: Home / Self Care | Source: Ambulatory Visit | Attending: Internal Medicine | Admitting: Internal Medicine

## 2023-11-29 DIAGNOSIS — G9389 Other specified disorders of brain: Secondary | ICD-10-CM | POA: Diagnosis not present

## 2023-11-29 DIAGNOSIS — C412 Malignant neoplasm of vertebral column: Secondary | ICD-10-CM

## 2023-11-29 MED ORDER — GADOPICLENOL 0.5 MMOL/ML IV SOLN
9.0000 mL | Freq: Once | INTRAVENOUS | Status: AC | PRN
  Administered 2023-11-29: 9 mL via INTRAVENOUS

## 2023-12-01 DIAGNOSIS — M1711 Unilateral primary osteoarthritis, right knee: Secondary | ICD-10-CM | POA: Diagnosis not present

## 2023-12-09 ENCOUNTER — Inpatient Hospital Stay: Attending: Internal Medicine | Admitting: Internal Medicine

## 2023-12-09 ENCOUNTER — Inpatient Hospital Stay: Payer: Self-pay | Admitting: Internal Medicine

## 2023-12-09 VITALS — BP 116/85 | HR 80 | Temp 97.3°F | Resp 17 | Ht 66.0 in | Wt 196.4 lb

## 2023-12-09 DIAGNOSIS — C410 Malignant neoplasm of bones of skull and face: Secondary | ICD-10-CM | POA: Diagnosis not present

## 2023-12-09 NOTE — Progress Notes (Signed)
 [facility] 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    Referring Provider: [facility] [address] [place],  [place] [address]  Oncologic History: Oncology History  Chordoma of clivus [facility])  06/06/2022 Surgery   Transnasal debulking resection with Dr. Madaline Brilliant at [facility]   09/24/2022 - 11/19/2022 Radiation Therapy   Proton beam RT at [facility], 70-70Gy    From [facility]: 10/2021: developed progressive double vision and horizontal diplopia 11/14/21: MRI brain/orbits with heterogeneous 2.0 cm cystic lesion involving the clivus with prepontine extension, abutment of the basilar artery, and mass effect on the adjacent pons. No pontine edema. 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 CTA head with fetal origin of bilateral PCA with diffusely diminutive/small caliber posterior circulation, clival osseous erosion with posterior projecting soft tissue mass. 05/14/22: MRI brain and total spine with stable appearance of clival enhancing mass without e/o drop metastases 06/06/22: undergoes endoscopic endonasal transclival tumor debulking with abdominal fat graft with Dr. Madaline Brilliant (NSGY) and Dr. Cristal Deer (OHNS) at [facility]. Some tumor adherent to the pons/basilar artery and could not be resected. No operative complications. Pathology c/w conventional chordoma, tumor cells  positive for Brachyury, EMA, S100. BAF 47 retained. Ki-67 < 5% 06/07/22: MRI brain with postsurgical changes with debulking of clival mass with improved mass effect. Small amount of surrounding mildly enhancing soft tissue anterior to the pons may reflect residual tumor. Radiation delayed due to insurance approval. 09/04/22: MRI brain with evolving surgical changes, residual prepontine soft tissue around the R lateral aspect of the basilar artery (0.8 x 0.6 cm), nodular enhancing soft tissue along the R clivus involving the region of Dorello's canal and along the posterior aspect of the R cavernous sinus unchanged, may also reflect residual tumor 09/10/22: Consult with Dr. Linna Hoff ([facility]) for discussion of proton therapy, plan for 70-76 Gy to residual tumor 09/24/22: Starts RT   Biomarkers:  History of Present Illness: The patient's records from the referring physician were obtained and reviewed and the patient interviewed to confirm this HPI.  Justin Hurst presents today for follow up of recently treated chordoma.  Radiation was completed (protons) at [facility] 1 year ago.  He denies any neurologic symptoms aside from mild occasional dizziness.  No additional double vision, which had been his presenting symptom.  No headaches or seizures.  His main functional limitation at this time is from knee pain, he has upcoming knee replacement scheduled.    Medications: Current Outpatient Medications on File Prior to Visit  Medication Sig Dispense Refill   atorvastatin (LIPITOR) 10 MG tablet Take 10 mg by mouth daily.     acetaminophen (TYLENOL) 500 MG tablet Take 2 tablets (1,000 mg total) by mouth every 8 (eight) hours as needed for mild pain or moderate pain. 60 tablet 0   ALPRAZolam (XANAX) 1 MG tablet Take 1 tablet (1 mg total) by mouth  at bedtime. 5 tablet 0   apixaban (ELIQUIS) 2.5 MG TABS tablet Take 1 tablet (2.5 mg total) by mouth 2 (two) times daily. 60 tablet 0   Calcium  Carbonate Antacid (TUMS PO) Take 1 tablet by mouth as needed (Acid Reflux).     chlorhexidine (HIBICLENS) 4 % external liquid Apply 15 mLs (1 Application total) topically as directed for 30 doses. Use as directed daily for 5 days every other week for 6 weeks. 946 mL 1   diphenhydrAMINE (BENADRYL) 25 mg capsule Take 1 capsule (25 mg total) by mouth every 6 (six) hours as needed for itching or allergies.     docusate sodium (COLACE) 100 MG capsule Take 1 capsule (100 mg total) by mouth 2 (two) times daily.     methocarbamol (ROBAXIN) 500 MG tablet Take 1-2 tablets (500-1,000 mg total) by mouth every 8 (eight) hours as needed for muscle spasms. 60 tablet 0   mupirocin ointment (BACTROBAN) 2 % Place 1 Application into the nose 2 (two) times daily. 22 g 0   oxyCODONE (OXY IR/ROXICODONE) 5 MG immediate release tablet Take 1-2 tablets (5-10 mg total) by mouth every 6 (six) hours as needed for moderate pain or severe pain. 60 tablet 0   polyethylene glycol (MIRALAX / GLYCOLAX) 17 g packet Take 17 g by mouth daily as needed for mild constipation.     sertraline (ZOLOFT) 100 MG tablet Take 100 mg by mouth every evening.      Vitamin D, Ergocalciferol, (DRISDOL) 1.25 MG (50000 UNIT) CAPS capsule Take 1 capsule (50,000 Units total) by mouth every 7 (seven) days.     No current facility-administered medications on file prior to visit.    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 Charlann Boxer Left Knee 03-09-18    KNEE ARTHROPLASTY     x2    KNEE ARTHROSCOPY     Left x3   ORIF FEMUR FRACTURE Left 03/09/2018   Procedure: Left knee femoral revision with open reduction internal fixation  of distal femur periprosthetic fracture;  Surgeon: Durene Romans, MD;  Location: [facility]S;  Service: Orthopedics;  Laterality: Left;  Adductor Block   ORIF FEMUR FRACTURE Left 06/26/2023   Procedure: OPEN REDUCTION INTERNAL FIXATION (ORIF) DISTAL FEMUR FRACTURE;  Surgeon: Myrene Galas, MD;  Location: [facility] OR;  Service: Orthopedics;  Laterality: Left;   SHOULDER ARTHROSCOPY     Left   TUMOR EXCISION  06/15/2022   brain   Social History:  Social History   Socioeconomic History   Marital status: Married    Spouse name: Not on file   Number of children: Not on file   Years of education: Not on file   Highest education level: Not on file  Occupational History   Not on file  Tobacco Use   Smoking status: Never   Smokeless tobacco: Never  Vaping Use   Vaping status: Never Used  Substance and Sexual Activity   Alcohol use: Yes    Alcohol/week: 10.0 standard drinks of alcohol    Types: 10 Cans of beer per  week   Drug use: Yes   Sexual activity: Yes  Other Topics Concern   Not on file  Social History Narrative   Not on file   Social Drivers of Health   Financial Resource Strain: Not on file  Food Insecurity: No Food Insecurity (06/25/2023)   Hunger Vital Sign    Worried About Running Out of Food in the Last Year: Never true    Ran Out of Food in the Last Year: Never true  Transportation Needs: No Transportation Needs (06/25/2023)   PRAPARE - Administrator, Civil Service (Medical): No    Lack of Transportation (Non-Medical): No  Physical Activity: Not on file  Stress: Not on file  Social Connections: Not on file  Intimate Partner Violence: Not At Risk (06/25/2023)   Humiliation, Afraid, Rape, and Kick questionnaire    Fear of Current or Ex-Partner: No    Emotionally Abused: No    Physically Abused: No    Sexually Abused: No   Family History: No family history on file.  Review of Systems: Constitutional: Doesn't report fevers, chills or abnormal weight loss Eyes:  Doesn't report blurriness of vision Ears, nose, mouth, throat, and face: Doesn't report sore throat Respiratory: Doesn't report cough, dyspnea or wheezes Cardiovascular: Doesn't report palpitation, chest discomfort  Gastrointestinal:  Doesn't report nausea, constipation, diarrhea GU: Doesn't report incontinence Skin: Doesn't report skin rashes Neurological: Per HPI Musculoskeletal: Doesn't report joint pain Behavioral/Psych: Doesn't report anxiety  Physical Exam: Vitals:   12/09/23 1410  BP: 116/85  Pulse: 80  Resp: 17  Temp: (!) 97.3 F (36.3 C)  SpO2: 97%   KPS: 80. General: Alert, cooperative, pleasant, in no acute distress Head: Normal EENT: No conjunctival injection or scleral icterus.  Lungs: Resp effort normal Cardiac: Regular rate Abdomen: Non-distended abdomen Skin: No rashes cyanosis or petechiae. Extremities: No clubbing or edema  Neurologic Exam: Mental Status: Awake, alert, attentive to examiner. Oriented to self and environment. Language is fluent with intact comprehension.  Cranial Nerves: Visual acuity is grossly normal. Visual fields are full. Extra-ocular movements intact. No ptosis. Face is symmetric Motor: Tone and bulk are normal. Power is full in both arms and legs. Reflexes are symmetric, no pathologic reflexes present.  Sensory: Intact to light touch Gait: Orthopedic limitations   Labs: I have reviewed the data as listed    Component Value Date/Time   NA 137 06/30/2023 0929   NA 142 11/09/2021 0932   K 3.7 06/30/2023 0929   CL 101 06/30/2023 0929   CO2 25 06/30/2023 0929   GLUCOSE 103 (H) 06/30/2023 0929   BUN 16 06/30/2023 0929   BUN 16 11/09/2021 0932   CREATININE 1.11 06/30/2023 0929   CALCIUM 9.0 06/30/2023 0929   PROT 6.7 06/26/2023 0516   ALBUMIN 3.4 (L) 06/26/2023 0516   AST 24 06/26/2023 0516   ALT 27 06/26/2023 0516   ALKPHOS 91 06/26/2023 0516   BILITOT 0.8 06/26/2023 0516   GFRNONAA >60 06/30/2023 0929   GFRAA >60  03/10/2018 0551   Lab Results  Component Value Date   WBC 7.2 06/30/2023   NEUTROABS 12.5 (H) 06/25/2023   HGB 10.9 (L) 06/30/2023   HCT 32.9 (L) 06/30/2023   MCV 93.2 06/30/2023   PLT 276 06/30/2023    Imaging: [facility] Clinician Interpretation: I have personally reviewed the CNS images as listed.  My interpretation, in the context of the patient's clinical presentation, is treatment effect vs true progression  MR Brain W Wo Contrast  Result Date: 11/29/2023 CLINICAL DATA:  Brain/CNS neoplasm, assess treatment response EXAM: MRI HEAD WITHOUT AND WITH CONTRAST TECHNIQUE: Multiplanar, multiecho pulse sequences of the brain and surrounding structures were obtained without and with intravenous contrast. CONTRAST:  9 mL Vueway COMPARISON:  None Available. FINDINGS: Brain: Interval debulking of the previously seen mass in the clivus with resection of the soft tissue component the previously extended into the basal cistern. Approximately 6 mm of peripheral enhancement in the right pons (series 119, image 64). Surrounding linear signal abnormality No evidence of acute infarct, midline shift, hydrocephalus. Vascular: Major arterial flow voids are maintained at the skull base. Skull and upper cervical spine: Persistent abnormal signal within the clivus. Sinuses/Orbits: Negative. IMPRESSION: 1. Interval debulking of the previously seen mass in the clivus with resection of the component that previously extended into the basal cistern. New 6 mm area of peripheral enhancement in the right pons is suspicious for tumor but could represent post treatment change or enhancing subacute infarct. Short interval follow-up MRI with contrast is recommended to assess stability. 2. Persistent abnormal signal within the clivus could be residual tumor and/or postoperative change and warrants attention on the follow-up. Electronically Signed   By: Feliberto Harts M.D.   On: 11/29/2023 19:50    Pathology: A-C. Skull mass centered  in clivus, resection:   Chordoma.   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 Dr. Eddie Candle has reviewed the case in consultation and concurs.   ** Patients and their health care providers are always welcome to call our office [phone]) to arrange to discuss the pathologic findings with me by phone or at the microscope (in person or virtually), or to email me at [email or web address]. As a surgical pathologist, a physician who diagnoses disease by microscopic examination of tissue, I am available as a member of the health care team to answer any questions you may have about the diagnosis.  **   Electronically signed by Marolyn Hammock, MD on 06/12/2022 at  3:56 PM    Assessment/Plan Chordoma of clivus [facility])  We appreciate the opportunity to participate in the care of Justin Hurst.  He is clinically stable from neurologic standpoint.  MRI brain unfortunately demonstrates a small novel focus of enhancement with accompanying T2 signal in the anterior right lateral pons. Etiology is uncertain; radiation necrosis would seem likely based on very high RT volume, timing since treatment, lack of symptoms.  Also possible is recurrence or transformation of chordoma.  He understands that our center has limited experience with complications of proton radiation.  Recommended very close monitoring, with repeat MRI brain in 1 month.  Discussed possibly starting steroids, but deferred given lack of focality on exam.    If following upcoming scan(s) tumor recurrence is deemed more likely, referral to sarcoma center at [facility] will be recommended.  He will also touch base with his surgical and oncologic teams at [facility], [facility] in the meantime for further input.  Screening for potential clinical trials was performed and discussed using eligibility criteria for active protocols at [facility], loco-regional tertiary centers, as well as national  database available on GroundTransfer.at.    The patient is not a candidate for a research protocol at this time due to no suitable study identified.   We spent twenty additional minutes teaching regarding the natural history, biology, and historical experience in the treatment of brain tumors. We then discussed in detail the current recommendations for therapy focusing on the mode  of administration, mechanism of action, anticipated toxicities, and quality of life issues associated with this plan. We also provided teaching sheets for the patient to take home as an additional resource.  We ask that Justin Hurst return to clinic in 1 months following next brain MRI, or sooner as needed.  All questions were answered. The patient knows to call the clinic with any problems, questions or concerns. No barriers to learning were detected.  The total time spent in the encounter was 60 minutes and more than 50% was on counseling and review of test results   Henreitta Leber, MD Medical Director of Neuro-Oncology [facility] [place] 12/09/23 2:19 PM

## 2023-12-10 ENCOUNTER — Telehealth: Payer: Self-pay | Admitting: Internal Medicine

## 2023-12-10 NOTE — Telephone Encounter (Signed)
 Completed - Patient scheduled appointments. Patient is aware of all appointment details.

## 2023-12-31 ENCOUNTER — Ambulatory Visit
Admission: RE | Admit: 2023-12-31 | Discharge: 2023-12-31 | Disposition: A | Discharge status: Home / Self Care | Source: Ambulatory Visit | Attending: Internal Medicine | Admitting: Internal Medicine

## 2023-12-31 DIAGNOSIS — G9389 Other specified disorders of brain: Secondary | ICD-10-CM | POA: Diagnosis not present

## 2023-12-31 DIAGNOSIS — C410 Malignant neoplasm of bones of skull and face: Secondary | ICD-10-CM

## 2023-12-31 DIAGNOSIS — Z9889 Other specified postprocedural states: Secondary | ICD-10-CM | POA: Diagnosis not present

## 2023-12-31 MED ORDER — GADOPICLENOL 0.5 MMOL/ML IV SOLN
9.0000 mL | Freq: Once | INTRAVENOUS | Status: AC | PRN
  Administered 2023-12-31: 9 mL via INTRAVENOUS

## 2024-01-05 ENCOUNTER — Inpatient Hospital Stay: Attending: Internal Medicine | Admitting: Internal Medicine

## 2024-01-05 VITALS — BP 134/88 | HR 72 | Temp 97.9°F | Resp 17 | Wt 198.1 lb

## 2024-01-05 DIAGNOSIS — Z923 Personal history of irradiation: Secondary | ICD-10-CM | POA: Insufficient documentation

## 2024-01-05 DIAGNOSIS — C410 Malignant neoplasm of bones of skull and face: Secondary | ICD-10-CM | POA: Diagnosis not present

## 2024-01-05 NOTE — Progress Notes (Signed)
 [facility] at [facility] 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 Madaline Brilliant at [facility]   09/24/2022 - 11/19/2022 Radiation Therapy   Proton beam RT at [facility], 70-70Gy    From [facility]: 10/2021: developed progressive double vision and horizontal diplopia 11/14/21: MRI brain/orbits with heterogeneous 2.0 cm cystic lesion involving the clivus with prepontine extension, abutment of the basilar artery, and mass effect on the adjacent pons. No pontine edema. 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 CTA head with fetal origin of bilateral PCA with diffusely diminutive/small caliber posterior circulation, clival osseous erosion with posterior projecting soft tissue mass. 05/14/22: MRI brain and total spine with stable appearance of clival enhancing mass without e/o drop metastases 06/06/22: undergoes endoscopic endonasal transclival tumor debulking with abdominal fat graft with Dr. Madaline Brilliant (NSGY) and Dr. Cristal Deer (OHNS) at [facility]. Some tumor adherent to the pons/basilar artery and could not be resected. No operative complications. Pathology c/w conventional chordoma, tumor cells positive for Brachyury, EMA, S100. BAF 47 retained. Ki-67 < 5% 06/07/22: MRI brain with postsurgical changes with debulking of clival mass with improved mass effect.  Small amount of surrounding mildly enhancing soft tissue anterior to the pons may reflect residual tumor. Radiation delayed due to insurance approval. 09/04/22: MRI brain with evolving surgical changes, residual prepontine soft tissue around the R lateral aspect of the basilar artery (0.8 x 0.6 cm), nodular enhancing soft tissue along the R clivus involving the region of Dorello's canal and along the posterior aspect of the R cavernous sinus unchanged, may also reflect residual tumor 09/10/22: Consult with Dr. Linna Hoff ([facility]) for discussion of proton therapy, plan for 70-76 Gy to residual tumor 09/24/22: Starts RT   Biomarkers:  Interval History: Justin Hurst presents today for follow up after recent MRI brain.  No new or progressive neurologic deficits reported.  He denies headaches, seizures.  Looking forward to surgery for his right knee so he can recover function and independence.  H+P (12/09/23) Patient presents today for follow up of recently treated chordoma.  Radiation was completed (protons) at [facility] 1 year ago.  He denies any neurologic symptoms aside from mild occasional dizziness.  No additional double vision, which had been his presenting symptom.  No headaches or seizures.  His main functional limitation at this time is from knee pain, he has upcoming knee replacement scheduled.    Medications: Current Outpatient Medications on File Prior to Visit  Medication Sig Dispense Refill   acetaminophen (TYLENOL) 500 MG tablet Take 2 tablets (1,000 mg total) by mouth every 8 (eight) hours as needed for mild pain or moderate pain. 60 tablet 0   ALPRAZolam (XANAX) 1 MG tablet Take 1 tablet (1 mg total) by mouth at bedtime. 5 tablet 0   apixaban (ELIQUIS) 2.5 MG TABS tablet Take 1 tablet (2.5 mg total) by  mouth 2 (two) times daily. 60 tablet 0   atorvastatin (LIPITOR) 10 MG tablet Take 10 mg by mouth daily.     Calcium Carbonate Antacid (TUMS PO) Take 1 tablet by mouth as  needed (Acid Reflux).     chlorhexidine (HIBICLENS) 4 % external liquid Apply 15 mLs (1 Application total) topically as directed for 30 doses. Use as directed daily for 5 days every other week for 6 weeks. 946 mL 1   Cholecalciferol (VITAMIN D-3 PO) Take 1 tablet by mouth every other day.     diphenhydrAMINE (BENADRYL) 25 mg capsule Take 1 capsule (25 mg total) by mouth every 6 (six) hours as needed for itching or allergies.     docusate sodium (COLACE) 100 MG capsule Take 1 capsule (100 mg total) by mouth 2 (two) times daily.     methocarbamol (ROBAXIN) 500 MG tablet Take 1-2 tablets (500-1,000 mg total) by mouth every 8 (eight) hours as needed for muscle spasms. 60 tablet 0   mupirocin ointment (BACTROBAN) 2 % Place 1 Application into the nose 2 (two) times daily. 22 g 0   oxyCODONE (OXY IR/ROXICODONE) 5 MG immediate release tablet Take 1-2 tablets (5-10 mg total) by mouth every 6 (six) hours as needed for moderate pain or severe pain. 60 tablet 0   polyethylene glycol (MIRALAX / GLYCOLAX) 17 g packet Take 17 g by mouth daily as needed for mild constipation.     sertraline (ZOLOFT) 100 MG tablet Take 100 mg by mouth every evening.      Vitamin D, Ergocalciferol, (DRISDOL) 1.25 MG (50000 UNIT) CAPS capsule Take 1 capsule (50,000 Units total) by mouth every 7 (seven) days.     No current facility-administered medications on file prior to visit.    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 Charlann Boxer Left Knee 03-09-18    KNEE ARTHROPLASTY     x2    KNEE ARTHROSCOPY     Left x3   ORIF FEMUR FRACTURE Left 03/09/2018   Procedure: Left knee femoral revision with open  reduction internal fixation of distal femur periprosthetic fracture;  Surgeon: Durene Romans, MD;  Location: [facility];  Service: Orthopedics;  Laterality: Left;  Adductor Block   ORIF FEMUR FRACTURE Left 06/26/2023   Procedure: OPEN REDUCTION INTERNAL FIXATION (ORIF) DISTAL FEMUR FRACTURE;  Surgeon: Myrene Galas, MD;  Location: [facility] OR;  Service: Orthopedics;  Laterality: Left;   SHOULDER ARTHROSCOPY     Left   TUMOR EXCISION  06/15/2022   brain   Social History:  Social History   Socioeconomic History   Marital status: Married    Spouse name: Not on file   Number of children: Not on file   Years of education: Not on file   Highest education level: Not on file  Occupational History   Not on file  Tobacco Use   Smoking status: Never   Smokeless tobacco: Never  Vaping Use   Vaping status: Never Used  Substance and Sexual Activity   Alcohol use: Yes    Alcohol/week: 10.0 standard drinks  of alcohol    Types: 10 Cans of beer per week   Drug use: Yes   Sexual activity: Yes  Other Topics Concern   Not on file  Social History Narrative   Not on file   Social Drivers of Health   Financial Resource Strain: Not on file  Food Insecurity: No Food Insecurity (06/25/2023)   Hunger Vital Sign    Worried About Running Out of Food in the Last Year: Never true    Ran Out of Food in the Last Year: Never true  Transportation Needs: No Transportation Needs (06/25/2023)   PRAPARE - Administrator, Civil Service (Medical): No    Lack of Transportation (Non-Medical): No  Physical Activity: Not on file  Stress: Not on file  Social Connections: Not on file  Intimate Partner Violence: Not At Risk (06/25/2023)   Humiliation, Afraid, Rape, and Kick questionnaire    Fear of Current or Ex-Partner: No    Emotionally Abused: No    Physically Abused: No    Sexually Abused: No   Family History: No family history on file.  Review of Systems: Constitutional: Doesn't report fevers, chills or  abnormal weight loss Eyes: Doesn't report blurriness of vision Ears, nose, mouth, throat, and face: Doesn't report sore throat Respiratory: Doesn't report cough, dyspnea or wheezes Cardiovascular: Doesn't report palpitation, chest discomfort  Gastrointestinal:  Doesn't report nausea, constipation, diarrhea GU: Doesn't report incontinence Skin: Doesn't report skin rashes Neurological: Per HPI Musculoskeletal: Doesn't report joint pain Behavioral/Psych: Doesn't report anxiety  Physical Exam: Vitals:   01/05/24 1240  BP: 134/88  Pulse: 72  Resp: 17  Temp: 97.9 F (36.6 C)  SpO2: 98%    KPS: 80. General: Alert, cooperative, pleasant, in no acute distress Head: Normal EENT: No conjunctival injection or scleral icterus.  Lungs: Resp effort normal Cardiac: Regular rate Abdomen: Non-distended abdomen Skin: No rashes cyanosis or petechiae. Extremities: No clubbing or edema  Neurologic Exam: Mental Status: Awake, alert, attentive to examiner. Oriented to self and environment. Language is fluent with intact comprehension.  Cranial Nerves: Visual acuity is grossly normal. Visual fields are full. Extra-ocular movements intact. No ptosis. Face is symmetric Motor: Tone and bulk are normal. Power is full in both arms and legs. Reflexes are symmetric, no pathologic reflexes present.  Sensory: Intact to light touch Gait: Orthopedic limitations   Labs: I have reviewed the data as listed    Component Value Date/Time   NA 137 06/30/2023 0929   NA 142 11/09/2021 0932   K 3.7 06/30/2023 0929   CL 101 06/30/2023 0929   CO2 25 06/30/2023 0929   GLUCOSE 103 (H) 06/30/2023 0929   BUN 16 06/30/2023 0929   BUN 16 11/09/2021 0932   CREATININE 1.11 06/30/2023 0929   CALCIUM 9.0 06/30/2023 0929   PROT 6.7 06/26/2023 0516   ALBUMIN 3.4 (L) 06/26/2023 0516   AST 24 06/26/2023 0516   ALT 27 06/26/2023 0516   ALKPHOS 91 06/26/2023 0516   BILITOT 0.8 06/26/2023 0516   GFRNONAA >60 06/30/2023  0929   GFRAA >60 03/10/2018 0551   Lab Results  Component Value Date   WBC 7.2 06/30/2023   NEUTROABS 12.5 (H) 06/25/2023   HGB 10.9 (L) 06/30/2023   HCT 32.9 (L) 06/30/2023   MCV 93.2 06/30/2023   PLT 276 06/30/2023    Imaging: [facility] Clinician Interpretation: I have personally reviewed the CNS images as listed.  My interpretation, in the context of the patient's clinical presentation, is  treatment effect vs true progression  No results found.   Pathology: A-C. Skull mass centered in clivus, resection:   Chordoma.   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 Dr. Encarnacion Harris has reviewed the case in consultation and concurs.   ** Patients and their health care providers are always welcome to call our office [phone]) to arrange to discuss the pathologic findings with me by phone or at the microscope (in person or virtually), or to email me at [email or web address]. As a surgical pathologist, a physician who diagnoses disease by microscopic examination of tissue, I am available as a member of the health care team to answer any questions you may have about the diagnosis.  **   Electronically signed by Natalia Bailer, MD on 06/12/2022 at  3:56 PM    Assessment/Plan Chordoma of clivus [facility])  Justin Hurst is clinically stable today.  MRI brain re-demonstrates focus of enhancement within pons, but with no apparent progression compared to last month's study.  This remains indeterminate for radiation injury (favored) vs atypical recurrence of chordoma.  Official MRI read remains pending.  Recommended continuing imaging surveillance only at this time.  No CNS contraindication to planned orthopedic surgery.  We ask that Justin Hurst return to clinic in 3 months following next brain MRI, or sooner as needed.  All questions were answered. The patient knows to call the clinic with any problems, questions or  concerns. No barriers to learning were detected.  The total time spent in the encounter was 40 minutes and more than 50% was on counseling and review of test results   Mamie Searles, MD Medical Director of Neuro-Oncology [facility] 01/05/24 12:36 PM

## 2024-01-07 ENCOUNTER — Telehealth: Payer: Self-pay | Admitting: Internal Medicine

## 2024-01-07 NOTE — Telephone Encounter (Signed)
 Patient scheduled appointments. Patient is aware of all appointment details.

## 2024-01-14 ENCOUNTER — Telehealth: Payer: Self-pay

## 2024-01-14 NOTE — Telephone Encounter (Signed)
 Received VM from pt's wife, stating that pt's doctors at [facility] have not received pt's most recent scan(s) that Dr. Mark Sil said he would share with them. She states they would like the scans to be sent via [employer]. Wife states that she would also like Dr. Mark Sil to share pt's recent scan(s) w/ his oncologist at [facility]. Will inform Dr. Mark Sil of requests.

## 2024-01-14 NOTE — Telephone Encounter (Signed)
 TC to pt to inform that his MRI has not been read yet, which is why his doctors at [facility] -  have not received anything. Informed that Dr. Mark Sil has requested that the images be shared w/ [facility]ology and that this should be done tomorrow. Suggested that he check to make sure the images have been received in a few days. He verbalizes understanding.

## 2024-01-15 ENCOUNTER — Telehealth: Payer: Self-pay | Admitting: *Deleted

## 2024-01-15 ENCOUNTER — Encounter: Payer: Self-pay | Admitting: *Deleted

## 2024-01-15 NOTE — Telephone Encounter (Signed)
 Request sent for [facility] to push images from MRI brain on 12/31/23 to [facility].

## 2024-02-04 NOTE — Progress Notes (Addendum)
 Anesthesia Review:  PCP: Sharry Deem  Cardiologist : none   PPM/ ICD: Device Orders: Rep Notified:  Chest x-ray : EKG : Echo : Stress test: Cardiac Cath :   Activity level: can do a flight of stairs without difficutly  Sleep Study/ CPAP : none  Fasting Blood Sugar :      / Checks Blood Sugar -- times a day:    Blood Thinner/ Instructions /Last Dose: ASA / Instructions/ Last Dose :    PT fell on 02/04/2024 per pt on left knee Large area scabbed over.  No sign of infection.     Hx of MRSA

## 2024-02-06 DIAGNOSIS — M1711 Unilateral primary osteoarthritis, right knee: Secondary | ICD-10-CM | POA: Diagnosis not present

## 2024-02-06 DIAGNOSIS — M25561 Pain in right knee: Secondary | ICD-10-CM | POA: Diagnosis not present

## 2024-02-09 NOTE — Patient Instructions (Signed)
 SURGICAL WAITING ROOM VISITATION  Patients having surgery or a procedure may have no more than 2 support people in the waiting area - these visitors may rotate.    Children under the age of 36 must have an adult with them who is not the patient.  Due to an increase in RSV and influenza rates and associated hospitalizations, children ages 30 and under may not visit patients in [facility] hospitals.  Visitors with respiratory illnesses are discouraged from visiting and should remain at home.  If the patient needs to stay at the hospital during part of their recovery, the visitor guidelines for inpatient rooms apply. Pre-op nurse will coordinate an appropriate time for 1 support person to accompany patient in pre-op.  This support person may not rotate.    Please refer to the [facility] website for the visitor guidelines for Inpatients (after your surgery is over and you are in a regular room).       Your procedure is scheduled on:  02/24/2024    Report to [facility] Main Entrance    Report to admitting at   [phone]   Call this number if you have problems the morning of surgery [phone]   Do not eat food :After Midnight.   After Midnight you may have the following liquids until _ 0415_____ AM  DAY OF SURGERY  Water  Non-Citrus Juices (without pulp, NO RED-Apple, White grape, White cranberry) Black Coffee (NO MILK/CREAM OR CREAMERS, sugar ok)  Clear Tea (NO MILK/CREAM OR CREAMERS, sugar ok) regular and decaf                             Plain Jell-O (NO RED)                                           Fruit ices (not with fruit pulp, NO RED)                                     Popsicles (NO RED)                                                               Sports drinks like Gatorade (NO RED)      The day of surgery:  Drink ONE (1) Pre-Surgery Clear Ensure or G2 at  0415AM  ( have completed by ) the morning of surgery. Drink in one sitting. Do not sip.  This drink was  given to you during your hospital  pre-op appointment visit. Nothing else to drink after completing the  Pre-Surgery Clear Ensure or G2.          If you have questions, please contact your surgeon's office.       Oral Hygiene is also important to reduce your risk of infection.                                    Remember - BRUSH YOUR TEETH THE MORNING OF SURGERY WITH  YOUR REGULAR TOOTHPASTE  DENTURES WILL BE REMOVED PRIOR TO SURGERY PLEASE DO NOT APPLY "Poly grip" OR ADHESIVES!!!   Do NOT smoke after Midnight   Stop all vitamins and herbal supplements 7 days before surgery.   Take these medicines the morning of surgery with A SIP OF WATER :  none   DO NOT TAKE ANY ORAL DIABETIC MEDICATIONS DAY OF YOUR SURGERY  Bring CPAP mask and tubing day of surgery.                              You may not have any metal on your body including hair pins, jewelry, and body piercing             Do not wear make-up, lotions, powders, perfumes/cologne, or deodorant  Do not wear nail polish including gel and S&S, artificial/acrylic nails, or any other type of covering on natural nails including finger and toenails. If you have artificial nails, gel coating, etc. that needs to be removed by a nail salon please have this removed prior to surgery or surgery may need to be canceled/ delayed if the surgeon/ anesthesia feels like they are unable to be safely monitored.   Do not shave  48 hours prior to surgery.               Men may shave face and neck.   Do not bring valuables to the hospital. [facility] IS NOT             RESPONSIBLE   FOR VALUABLES.   Contacts, glasses, dentures or bridgework may not be worn into surgery.   Bring small overnight bag day of surgery.   DO NOT BRING YOUR HOME MEDICATIONS TO THE HOSPITAL. PHARMACY WILL DISPENSE MEDICATIONS LISTED ON YOUR MEDICATION LIST TO YOU DURING YOUR ADMISSION IN THE HOSPITAL!    Patients discharged on the day of surgery will not be allowed to  drive home.  Someone NEEDS to stay with you for the first 24 hours after anesthesia.   Special Instructions: Bring a copy of your healthcare power of attorney and living will documents the day of surgery if you haven't scanned them before.              Please read over the following fact sheets you were given: IF YOU HAVE QUESTIONS ABOUT YOUR PRE-OP INSTRUCTIONS PLEASE CALL [phone]   If you received a COVID test during your pre-op visit  it is requested that you wear a mask when out in public, stay away from anyone that may not be feeling well and notify your surgeon if you develop symptoms. If you test positive for Covid or have been in contact with anyone that has tested positive in the last 10 days please notify you surgeon.      Pre-operative 5 CHG Bath Instructions   You can play a key role in reducing the risk of infection after surgery. Your skin needs to be as free of germs as possible. You can reduce the number of germs on your skin by washing with CHG (chlorhexidine  gluconate) soap before surgery. CHG is an antiseptic soap that kills germs and continues to kill germs even after washing.   DO NOT use if you have an allergy to chlorhexidine /CHG or antibacterial soaps. If your skin becomes reddened or irritated, stop using the CHG and notify one of our RNs at [phone].   Please shower with the CHG soap starting  4 days before surgery using the following schedule:     Please keep in mind the following:  DO NOT shave, including legs and underarms, starting the day of your first shower.   You may shave your face at any point before/day of surgery.  Place clean sheets on your bed the day you start using CHG soap. Use a clean washcloth (not used since being washed) for each shower. DO NOT sleep with pets once you start using the CHG.   CHG Shower Instructions:  If you choose to wash your hair and private area, wash first with your normal shampoo/soap.  After you use  shampoo/soap, rinse your hair and body thoroughly to remove shampoo/soap residue.  Turn the water  OFF and apply about 3 tablespoons (45 ml) of CHG soap to a CLEAN washcloth.  Apply CHG soap ONLY FROM YOUR NECK DOWN TO YOUR TOES (washing for 3-5 minutes)  DO NOT use CHG soap on face, private areas, open wounds, or sores.  Pay special attention to the area where your surgery is being performed.  If you are having back surgery, having someone wash your back for you may be helpful. Wait 2 minutes after CHG soap is applied, then you may rinse off the CHG soap.  Pat dry with a clean towel  Put on clean clothes/pajamas   If you choose to wear lotion, please use ONLY the CHG-compatible lotions on the back of this paper.     Additional instructions for the day of surgery: DO NOT APPLY any lotions, deodorants, cologne, or perfumes.   Put on clean/comfortable clothes.  Brush your teeth.  Ask your nurse before applying any prescription medications to the skin.      CHG Compatible Lotions   Aveeno Moisturizing lotion  Cetaphil Moisturizing Cream  Cetaphil Moisturizing Lotion  Clairol Herbal Essence Moisturizing Lotion, Dry Skin  Clairol Herbal Essence Moisturizing Lotion, Extra Dry Skin  Clairol Herbal Essence Moisturizing Lotion, Normal Skin  Curel Age Defying Therapeutic Moisturizing Lotion with Alpha Hydroxy  Curel Extreme Care Body Lotion  Curel Soothing Hands Moisturizing Hand Lotion  Curel Therapeutic Moisturizing Cream, Fragrance-Free  Curel Therapeutic Moisturizing Lotion, Fragrance-Free  Curel Therapeutic Moisturizing Lotion, Original Formula  Eucerin Daily Replenishing Lotion  Eucerin Dry Skin Therapy Plus Alpha Hydroxy Crme  Eucerin Dry Skin Therapy Plus Alpha Hydroxy Lotion  Eucerin Original Crme  Eucerin Original Lotion  Eucerin Plus Crme Eucerin Plus Lotion  Eucerin TriLipid Replenishing Lotion  Keri Anti-Bacterial Hand Lotion  Keri Deep Conditioning Original Lotion Dry  Skin Formula Softly Scented  Keri Deep Conditioning Original Lotion, Fragrance Free Sensitive Skin Formula  Keri Lotion Fast Absorbing Fragrance Free Sensitive Skin Formula  Keri Lotion Fast Absorbing Softly Scented Dry Skin Formula  Keri Original Lotion  Keri Skin Renewal Lotion Keri Silky Smooth Lotion  Keri Silky Smooth Sensitive Skin Lotion  Nivea Body Creamy Conditioning Oil  Nivea Body Extra Enriched Teacher, adult education Moisturizing Lotion Nivea Crme  Nivea Skin Firming Lotion  NutraDerm 30 Skin Lotion  NutraDerm Skin Lotion  NutraDerm Therapeutic Skin Cream  NutraDerm Therapeutic Skin Lotion  ProShield Protective Hand Cream  Provon moisturizing lotion

## 2024-02-11 ENCOUNTER — Other Ambulatory Visit: Payer: Self-pay

## 2024-02-11 ENCOUNTER — Encounter (HOSPITAL_COMMUNITY)
Admission: RE | Admit: 2024-02-11 | Discharge: 2024-02-11 | Disposition: A | Discharge status: Home / Self Care | Source: Ambulatory Visit | Attending: Orthopedic Surgery | Admitting: Orthopedic Surgery

## 2024-02-11 ENCOUNTER — Encounter (HOSPITAL_COMMUNITY): Payer: Self-pay

## 2024-02-11 VITALS — BP 123/75 | HR 67 | Temp 98.6°F | Resp 16 | Ht 66.0 in

## 2024-02-11 DIAGNOSIS — Z01818 Encounter for other preprocedural examination: Secondary | ICD-10-CM

## 2024-02-11 DIAGNOSIS — Z01812 Encounter for preprocedural laboratory examination: Secondary | ICD-10-CM | POA: Insufficient documentation

## 2024-02-11 HISTORY — DX: Dyspnea, unspecified: R06.00

## 2024-02-11 HISTORY — DX: Gastro-esophageal reflux disease without esophagitis: K21.9

## 2024-02-11 LAB — BASIC METABOLIC PANEL WITH GFR
Anion gap: 8 (ref 5–15)
BUN: 25 mg/dL — ABNORMAL HIGH (ref 8–23)
CO2: 26 mmol/L (ref 22–32)
Calcium: 9.6 mg/dL (ref 8.9–10.3)
Chloride: 104 mmol/L (ref 98–111)
Creatinine, Ser: 1.14 mg/dL (ref 0.61–1.24)
GFR, Estimated: >60 mL/min (ref >60)
Glucose, Bld: 88 mg/dL (ref 70–99)
Potassium: 4.8 mmol/L (ref 3.5–5.1)
Sodium: 138 mmol/L (ref 135–145)

## 2024-02-11 LAB — CBC
HCT: 41.8 % (ref 39.0–52.0)
Hemoglobin: 14 g/dL (ref 13.0–17.0)
MCH: 31.7 pg (ref 26.0–34.0)
MCHC: 33.5 g/dL (ref 30.0–36.0)
MCV: 94.6 fL (ref 80.0–100.0)
Platelets: 272 10*3/uL (ref 150–400)
RBC: 4.42 MIL/uL (ref 4.22–5.81)
RDW: 14.2 % (ref 11.5–15.5)
WBC: 7.1 10*3/uL (ref 4.0–10.5)
nRBC: 0 % (ref 0.0–0.2)

## 2024-02-12 LAB — SURGICAL PCR SCREEN
MRSA, PCR: POSITIVE — AB
Staphylococcus aureus: POSITIVE — AB

## 2024-02-12 NOTE — Progress Notes (Signed)
PCR results sent to Dr. Olin to review.   

## 2024-02-24 ENCOUNTER — Observation Stay (HOSPITAL_COMMUNITY)
Admission: RE | Admit: 2024-02-24 | Discharge: 2024-02-25 | Disposition: A | Discharge status: Home / Self Care | Source: Ambulatory Visit | Attending: Orthopedic Surgery | Admitting: Orthopedic Surgery

## 2024-02-24 ENCOUNTER — Ambulatory Visit (HOSPITAL_COMMUNITY): Payer: Self-pay | Admitting: Physician Assistant

## 2024-02-24 ENCOUNTER — Encounter (HOSPITAL_COMMUNITY)
Admission: RE | Disposition: A | Discharge status: Home / Self Care | Payer: Self-pay | Source: Ambulatory Visit | Attending: Orthopedic Surgery

## 2024-02-24 ENCOUNTER — Other Ambulatory Visit: Payer: Self-pay

## 2024-02-24 ENCOUNTER — Encounter (HOSPITAL_COMMUNITY): Payer: Self-pay | Admitting: Orthopedic Surgery

## 2024-02-24 ENCOUNTER — Ambulatory Visit (HOSPITAL_COMMUNITY)

## 2024-02-24 DIAGNOSIS — M1711 Unilateral primary osteoarthritis, right knee: Secondary | ICD-10-CM | POA: Diagnosis not present

## 2024-02-24 DIAGNOSIS — Z01818 Encounter for other preprocedural examination: Secondary | ICD-10-CM

## 2024-02-24 DIAGNOSIS — Z85828 Personal history of other malignant neoplasm of skin: Secondary | ICD-10-CM | POA: Diagnosis not present

## 2024-02-24 DIAGNOSIS — Z96651 Presence of right artificial knee joint: Principal | ICD-10-CM

## 2024-02-24 DIAGNOSIS — Z96652 Presence of left artificial knee joint: Secondary | ICD-10-CM | POA: Diagnosis not present

## 2024-02-24 DIAGNOSIS — Z85841 Personal history of malignant neoplasm of brain: Secondary | ICD-10-CM | POA: Diagnosis not present

## 2024-02-24 DIAGNOSIS — G8918 Other acute postprocedural pain: Secondary | ICD-10-CM | POA: Diagnosis not present

## 2024-02-24 HISTORY — DX: Malignant neoplasm of brain, unspecified: C71.9

## 2024-02-24 HISTORY — PX: TOTAL KNEE ARTHROPLASTY: SHX125

## 2024-02-24 HISTORY — DX: Unspecified malignant neoplasm of skin, unspecified: C44.90

## 2024-02-24 SURGERY — ARTHROPLASTY, KNEE, TOTAL
Anesthesia: Monitor Anesthesia Care | Site: Knee | Laterality: Right

## 2024-02-24 MED ORDER — TRANEXAMIC ACID-NACL 1000-0.7 MG/100ML-% IV SOLN
1000.0000 mg | INTRAVENOUS | Status: AC
  Administered 2024-02-24: 1000 mg via INTRAVENOUS
  Filled 2024-02-24: qty 100

## 2024-02-24 MED ORDER — OXYCODONE HCL 5 MG/5ML PO SOLN: 5.0000 mg | Freq: Once | ORAL | Status: DC | PRN

## 2024-02-24 MED ORDER — ONDANSETRON HCL 4 MG PO TABS: 4.0000 mg | ORAL_TABLET | Freq: Four times a day (QID) | ORAL | Status: DC | PRN

## 2024-02-24 MED ORDER — PROPOFOL 10 MG/ML IV BOLUS
INTRAVENOUS | Status: DC | PRN
  Administered 2024-02-24 (×2): 30 mg via INTRAVENOUS

## 2024-02-24 MED ORDER — DEXAMETHASONE SODIUM PHOSPHATE 10 MG/ML IJ SOLN
8.0000 mg | Freq: Once | INTRAMUSCULAR | Status: AC
  Administered 2024-02-24: 8 mg via INTRAVENOUS

## 2024-02-24 MED ORDER — SODIUM CHLORIDE 0.9% FLUSH: 3.0000 mL | INTRAVENOUS | Status: DC | PRN

## 2024-02-24 MED ORDER — TRANEXAMIC ACID-NACL 1000-0.7 MG/100ML-% IV SOLN
1000.0000 mg | Freq: Once | INTRAVENOUS | Status: AC
  Administered 2024-02-24: 1000 mg via INTRAVENOUS
  Filled 2024-02-24: qty 100

## 2024-02-24 MED ORDER — MIDAZOLAM HCL 5 MG/5ML IJ SOLN
INTRAMUSCULAR | Status: DC | PRN
  Administered 2024-02-24: 1 mg via INTRAVENOUS

## 2024-02-24 MED ORDER — ALBUMIN HUMAN 5 % IV SOLN
INTRAVENOUS | Status: DC | PRN
Start: 2024-02-24 — End: 2024-02-24

## 2024-02-24 MED ORDER — OXYCODONE HCL 5 MG PO TABS
10.0000 mg | ORAL_TABLET | ORAL | Status: DC | PRN
  Administered 2024-02-24 – 2024-02-25 (×3): 10 mg via ORAL
  Filled 2024-02-24 (×3): qty 2

## 2024-02-24 MED ORDER — VANCOMYCIN HCL IN DEXTROSE 1-5 GM/200ML-% IV SOLN
1000.0000 mg | INTRAVENOUS | Status: AC
  Administered 2024-02-24: 1000 mg via INTRAVENOUS
  Filled 2024-02-24: qty 200

## 2024-02-24 MED ORDER — CEFAZOLIN SODIUM-DEXTROSE 2-4 GM/100ML-% IV SOLN
2.0000 g | Freq: Four times a day (QID) | INTRAVENOUS | Status: AC
  Administered 2024-02-24 – 2024-02-25 (×2): 2 g via INTRAVENOUS
  Filled 2024-02-24 (×2): qty 100

## 2024-02-24 MED ORDER — PROPOFOL 1000 MG/100ML IV EMUL
INTRAVENOUS | Status: AC
  Filled 2024-02-24: qty 100

## 2024-02-24 MED ORDER — SODIUM CHLORIDE 0.9 % IR SOLN
Status: DC | PRN
  Administered 2024-02-24: 1000 mL

## 2024-02-24 MED ORDER — ALBUMIN HUMAN 5 % IV SOLN
INTRAVENOUS | Status: AC
  Filled 2024-02-24: qty 250

## 2024-02-24 MED ORDER — ROPIVACAINE HCL 5 MG/ML IJ SOLN
INTRAMUSCULAR | Status: DC | PRN
Start: 2024-02-24 — End: 2024-02-24
  Administered 2024-02-24: 25 mL via PERINEURAL

## 2024-02-24 MED ORDER — METHOCARBAMOL 500 MG PO TABS
500.0000 mg | ORAL_TABLET | Freq: Four times a day (QID) | ORAL | Status: DC | PRN
  Administered 2024-02-24: 500 mg via ORAL
  Filled 2024-02-24: qty 1

## 2024-02-24 MED ORDER — ONDANSETRON HCL 4 MG/2ML IJ SOLN: 4.0000 mg | Freq: Four times a day (QID) | INTRAMUSCULAR | Status: DC | PRN

## 2024-02-24 MED ORDER — BUPIVACAINE IN DEXTROSE 0.75-8.25 % IT SOLN
INTRATHECAL | Status: DC | PRN
  Administered 2024-02-24: 1.8 mL via INTRATHECAL

## 2024-02-24 MED ORDER — PROPOFOL 10 MG/ML IV BOLUS
INTRAVENOUS | Status: AC
  Filled 2024-02-24: qty 20

## 2024-02-24 MED ORDER — POVIDONE-IODINE 10 % EX SWAB: 2.0000 | Freq: Once | CUTANEOUS | Status: DC

## 2024-02-24 MED ORDER — FENTANYL CITRATE (PF) 100 MCG/2ML IJ SOLN
INTRAMUSCULAR | Status: DC | PRN
  Administered 2024-02-24: 50 ug via INTRAVENOUS

## 2024-02-24 MED ORDER — KETOROLAC TROMETHAMINE 30 MG/ML IJ SOLN
INTRAMUSCULAR | Status: AC
  Filled 2024-02-24: qty 1

## 2024-02-24 MED ORDER — OXYCODONE HCL 5 MG PO TABS: 5.0000 mg | ORAL_TABLET | Freq: Once | ORAL | Status: DC | PRN

## 2024-02-24 MED ORDER — DIPHENHYDRAMINE HCL 12.5 MG/5ML PO ELIX: 12.5000 mg | ORAL_SOLUTION | ORAL | Status: DC | PRN

## 2024-02-24 MED ORDER — OXYCODONE HCL 5 MG PO TABS
5.0000 mg | ORAL_TABLET | ORAL | Status: DC | PRN
  Administered 2024-02-24: 10 mg via ORAL
  Filled 2024-02-24: qty 2

## 2024-02-24 MED ORDER — MIDAZOLAM HCL 2 MG/2ML IJ SOLN
INTRAMUSCULAR | Status: AC
  Filled 2024-02-24: qty 2

## 2024-02-24 MED ORDER — ALUM & MAG HYDROXIDE-SIMETH 200-200-20 MG/5ML PO SUSP: 30.0000 mL | ORAL | Status: DC | PRN

## 2024-02-24 MED ORDER — SODIUM CHLORIDE 0.9% FLUSH
3.0000 mL | Freq: Two times a day (BID) | INTRAVENOUS | Status: DC
  Administered 2024-02-24: 3 mL via INTRAVENOUS
  Administered 2024-02-24 – 2024-02-25 (×2): 10 mL via INTRAVENOUS

## 2024-02-24 MED ORDER — FENTANYL CITRATE PF 50 MCG/ML IJ SOSY: 25.0000 ug | PREFILLED_SYRINGE | INTRAMUSCULAR | Status: DC | PRN

## 2024-02-24 MED ORDER — FENTANYL CITRATE (PF) 100 MCG/2ML IJ SOLN
INTRAMUSCULAR | Status: AC
  Filled 2024-02-24: qty 2

## 2024-02-24 MED ORDER — METOCLOPRAMIDE HCL 5 MG PO TABS: 5.0000 mg | ORAL_TABLET | Freq: Three times a day (TID) | ORAL | Status: DC | PRN

## 2024-02-24 MED ORDER — LACTATED RINGERS IV SOLN: INTRAVENOUS | Status: DC

## 2024-02-24 MED ORDER — BUPIVACAINE-EPINEPHRINE (PF) 0.25% -1:200000 IJ SOLN
INTRAMUSCULAR | Status: AC
  Filled 2024-02-24: qty 30

## 2024-02-24 MED ORDER — MUPIROCIN 2 % EX OINT: 1.0000 | TOPICAL_OINTMENT | Freq: Two times a day (BID) | CUTANEOUS | 0 refills | Status: AC

## 2024-02-24 MED ORDER — MENTHOL 3 MG MT LOZG: 1.0000 | LOZENGE | OROMUCOSAL | Status: DC | PRN

## 2024-02-24 MED ORDER — 0.9 % SODIUM CHLORIDE (POUR BTL) OPTIME
TOPICAL | Status: DC | PRN
  Administered 2024-02-24: 1000 mL

## 2024-02-24 MED ORDER — ACETAMINOPHEN 500 MG PO TABS
1000.0000 mg | ORAL_TABLET | Freq: Four times a day (QID) | ORAL | Status: DC
  Administered 2024-02-24 – 2024-02-25 (×3): 1000 mg via ORAL
  Filled 2024-02-24 (×4): qty 2

## 2024-02-24 MED ORDER — DEXMEDETOMIDINE HCL IN NACL 80 MCG/20ML IV SOLN
INTRAVENOUS | Status: AC
  Filled 2024-02-24: qty 20

## 2024-02-24 MED ORDER — ORAL CARE MOUTH RINSE: 15.0000 mL | OROMUCOSAL | Status: DC | PRN

## 2024-02-24 MED ORDER — CHLORHEXIDINE GLUCONATE 0.12 % MT SOLN
15.0000 mL | Freq: Once | OROMUCOSAL | Status: AC
  Administered 2024-02-24: 15 mL via OROMUCOSAL

## 2024-02-24 MED ORDER — DEXAMETHASONE SODIUM PHOSPHATE 10 MG/ML IJ SOLN
10.0000 mg | Freq: Once | INTRAMUSCULAR | Status: AC
  Administered 2024-02-25: 10 mg via INTRAVENOUS
  Filled 2024-02-24: qty 1

## 2024-02-24 MED ORDER — ORAL CARE MOUTH RINSE: 15.0000 mL | Freq: Once | OROMUCOSAL | Status: AC

## 2024-02-24 MED ORDER — CEFAZOLIN SODIUM-DEXTROSE 2-4 GM/100ML-% IV SOLN
2.0000 g | INTRAVENOUS | Status: AC
  Administered 2024-02-24: 2 g via INTRAVENOUS
  Filled 2024-02-24: qty 100

## 2024-02-24 MED ORDER — SENNA 8.6 MG PO TABS
2.0000 | ORAL_TABLET | Freq: Every day | ORAL | Status: DC
  Administered 2024-02-24: 17.2 mg via ORAL
  Filled 2024-02-24: qty 2

## 2024-02-24 MED ORDER — METOCLOPRAMIDE HCL 5 MG/ML IJ SOLN: 5.0000 mg | Freq: Three times a day (TID) | INTRAMUSCULAR | Status: DC | PRN

## 2024-02-24 MED ORDER — POLYVINYL ALCOHOL 1.4 % OP SOLN: 1.0000 [drp] | Freq: Three times a day (TID) | OPHTHALMIC | Status: DC | PRN

## 2024-02-24 MED ORDER — POLYETHYLENE GLYCOL 3350 17 G PO PACK
17.0000 g | PACK | Freq: Two times a day (BID) | ORAL | Status: DC
  Administered 2024-02-24 – 2024-02-25 (×3): 17 g via ORAL
  Filled 2024-02-24 (×3): qty 1

## 2024-02-24 MED ORDER — DEXAMETHASONE SODIUM PHOSPHATE 10 MG/ML IJ SOLN
INTRAMUSCULAR | Status: AC
  Filled 2024-02-24: qty 1

## 2024-02-24 MED ORDER — ASPIRIN 81 MG PO CHEW
81.0000 mg | CHEWABLE_TABLET | Freq: Two times a day (BID) | ORAL | Status: DC
Start: 2024-02-24 — End: 2024-02-25
  Administered 2024-02-24 – 2024-02-25 (×2): 81 mg via ORAL
  Filled 2024-02-24 (×2): qty 1

## 2024-02-24 MED ORDER — ALPRAZOLAM 0.5 MG PO TABS
1.0000 mg | ORAL_TABLET | Freq: Every day | ORAL | Status: DC
  Administered 2024-02-24: 1 mg via ORAL
  Filled 2024-02-24: qty 2

## 2024-02-24 MED ORDER — TAMSULOSIN HCL 0.4 MG PO CAPS
0.4000 mg | ORAL_CAPSULE | Freq: Every day | ORAL | Status: DC
  Administered 2024-02-24: 0.4 mg via ORAL
  Filled 2024-02-24: qty 1

## 2024-02-24 MED ORDER — PHENOL 1.4 % MT LIQD
1.0000 | OROMUCOSAL | Status: DC | PRN
Start: 2024-02-24 — End: 2024-02-25

## 2024-02-24 MED ORDER — SODIUM CHLORIDE (PF) 0.9 % IJ SOLN
INTRAMUSCULAR | Status: DC | PRN
  Administered 2024-02-24: 61 mL

## 2024-02-24 MED ORDER — SERTRALINE HCL 100 MG PO TABS
200.0000 mg | ORAL_TABLET | Freq: Every day | ORAL | Status: DC
  Administered 2024-02-24: 200 mg via ORAL
  Filled 2024-02-24: qty 2

## 2024-02-24 MED ORDER — ONDANSETRON HCL 4 MG/2ML IJ SOLN
INTRAMUSCULAR | Status: AC
  Filled 2024-02-24: qty 2

## 2024-02-24 MED ORDER — ACETAMINOPHEN 10 MG/ML IV SOLN
INTRAVENOUS | Status: DC | PRN
  Administered 2024-02-24: 1000 mg via INTRAVENOUS

## 2024-02-24 MED ORDER — SODIUM CHLORIDE (PF) 0.9 % IJ SOLN
INTRAMUSCULAR | Status: AC
  Filled 2024-02-24: qty 50

## 2024-02-24 MED ORDER — CELECOXIB 200 MG PO CAPS
200.0000 mg | ORAL_CAPSULE | Freq: Two times a day (BID) | ORAL | Status: DC
  Administered 2024-02-24 – 2024-02-25 (×3): 200 mg via ORAL
  Filled 2024-02-24 (×3): qty 1

## 2024-02-24 MED ORDER — METHOCARBAMOL 1000 MG/10ML IJ SOLN: 500.0000 mg | Freq: Four times a day (QID) | INTRAMUSCULAR | Status: DC | PRN

## 2024-02-24 MED ORDER — BISACODYL 10 MG RE SUPP
10.0000 mg | Freq: Every day | RECTAL | Status: DC | PRN
Start: 2024-02-24 — End: 2024-02-25

## 2024-02-24 MED ORDER — PHENYLEPHRINE HCL-NACL 20-0.9 MG/250ML-% IV SOLN
INTRAVENOUS | Status: DC | PRN
  Administered 2024-02-24: 40 ug/min via INTRAVENOUS

## 2024-02-24 MED ORDER — ACETAMINOPHEN 10 MG/ML IV SOLN
INTRAVENOUS | Status: AC
  Filled 2024-02-24: qty 100

## 2024-02-24 MED ORDER — HYDROMORPHONE HCL 1 MG/ML IJ SOLN
0.5000 mg | INTRAMUSCULAR | Status: DC | PRN
  Administered 2024-02-25: 1 mg via INTRAVENOUS
  Filled 2024-02-24: qty 1

## 2024-02-24 MED ORDER — ONDANSETRON HCL 4 MG/2ML IJ SOLN
4.0000 mg | Freq: Four times a day (QID) | INTRAMUSCULAR | Status: DC | PRN
Start: 2024-02-24 — End: 2024-02-25

## 2024-02-24 MED ORDER — ONDANSETRON HCL 4 MG/2ML IJ SOLN
INTRAMUSCULAR | Status: DC | PRN
Start: 2024-02-24 — End: 2024-02-24
  Administered 2024-02-24: 4 mg via INTRAVENOUS

## 2024-02-24 MED ORDER — CHLORHEXIDINE GLUCONATE 4 % EX SOLN: 1.0000 | CUTANEOUS | 1 refills | Status: DC

## 2024-02-24 MED ORDER — PROPOFOL 500 MG/50ML IV EMUL
INTRAVENOUS | Status: DC | PRN
  Administered 2024-02-24: 100 ug/kg/min via INTRAVENOUS

## 2024-02-24 SURGICAL SUPPLY — 46 items
ATTUNE MED ANAT PAT 38 KNEE (Knees) IMPLANT
BAG COUNTER SPONGE SURGICOUNT (BAG) IMPLANT
BAG ZIPLOCK 12X15 (MISCELLANEOUS) ×2 IMPLANT
BASE TIBIAL CEM ATTUNE SZ 7 (Knees) ×1 IMPLANT
BASEPLATE TIB CEM ATTUNE SZ7 (Knees) IMPLANT
BLADE SAW SGTL 13.0X1.19X90.0M (BLADE) ×2 IMPLANT
BNDG ELASTIC 6INX 5YD STR LF (GAUZE/BANDAGES/DRESSINGS) ×2 IMPLANT
BOWL SMART MIX CTS (DISPOSABLE) ×2 IMPLANT
CEMENT HV SMART SET (Cement) ×4 IMPLANT
COMPONENT FEM CMT ATTN KN 5 RT (Joint) IMPLANT
COOLER ICEMAN CLASSIC (MISCELLANEOUS) IMPLANT
COVER SURGICAL LIGHT HANDLE (MISCELLANEOUS) ×2 IMPLANT
CUFF TRNQT CYL 34X4.125X (TOURNIQUET CUFF) ×2 IMPLANT
DERMABOND ADVANCED .7 DNX12 (GAUZE/BANDAGES/DRESSINGS) ×2 IMPLANT
DRAPE U-SHAPE 47X51 STRL (DRAPES) ×2 IMPLANT
DRESSING AQUACEL AG SP 3.5X10 (GAUZE/BANDAGES/DRESSINGS) ×2 IMPLANT
DURAPREP 26ML APPLICATOR (WOUND CARE) ×4 IMPLANT
ELECT REM PT RETURN 15FT ADLT (MISCELLANEOUS) ×2 IMPLANT
GLOVE BIO SURGEON STRL SZ 6 (GLOVE) ×2 IMPLANT
GLOVE BIOGEL PI IND STRL 6.5 (GLOVE) ×2 IMPLANT
GLOVE BIOGEL PI IND STRL 7.5 (GLOVE) ×2 IMPLANT
GLOVE ORTHO TXT STRL SZ7.5 (GLOVE) ×4 IMPLANT
GOWN STRL REUS W/ TWL LRG LVL3 (GOWN DISPOSABLE) ×4 IMPLANT
HOLDER FOLEY CATH W/STRAP (MISCELLANEOUS) IMPLANT
INSERT TIB ATT 5 10 MED RT (Insert) IMPLANT
KIT TURNOVER KIT A (KITS) ×2 IMPLANT
MANIFOLD NEPTUNE II (INSTRUMENTS) ×2 IMPLANT
NDL SAFETY ECLIPSE 18X1.5 (NEEDLE) IMPLANT
NS IRRIG 1000ML POUR BTL (IV SOLUTION) ×2 IMPLANT
PACK TOTAL KNEE CUSTOM (KITS) ×2 IMPLANT
PAD COLD SHLDR WRAP-ON (PAD) IMPLANT
PENCIL SMOKE EVACUATOR (MISCELLANEOUS) ×2 IMPLANT
PIN FIX SIGMA LCS THRD HI (PIN) IMPLANT
PROTECTOR NERVE ULNAR (MISCELLANEOUS) ×2 IMPLANT
SET HNDPC FAN SPRY TIP SCT (DISPOSABLE) ×2 IMPLANT
SET PAD KNEE POSITIONER (MISCELLANEOUS) ×2 IMPLANT
SUT MNCRL AB 4-0 PS2 18 (SUTURE) ×2 IMPLANT
SUT STRATAFIX PDS+ 0 24IN (SUTURE) ×2 IMPLANT
SUT VIC AB 1 CT1 36 (SUTURE) ×2 IMPLANT
SUT VIC AB 2-0 CT1 TAPERPNT 27 (SUTURE) ×4 IMPLANT
SYR 3ML LL SCALE MARK (SYRINGE) ×2 IMPLANT
TOWEL GREEN STERILE FF (TOWEL DISPOSABLE) ×2 IMPLANT
TRAY FOLEY MTR SLVR 16FR STAT (SET/KITS/TRAYS/PACK) ×2 IMPLANT
TUBE SUCTION HIGH CAP CLEAR NV (SUCTIONS) ×2 IMPLANT
WATER STERILE IRR 1000ML POUR (IV SOLUTION) ×4 IMPLANT
WRAP KNEE MAXI GEL POST OP (GAUZE/BANDAGES/DRESSINGS) ×2 IMPLANT

## 2024-02-24 NOTE — Anesthesia Procedure Notes (Signed)
 Anesthesia Regional Block: Adductor canal block   Pre-Anesthetic Checklist: , timeout performed,  Correct Patient, Correct Site, Correct Laterality,  Correct Procedure, Correct Position, site marked,  Risks and benefits discussed,  Surgical consent,  Pre-op evaluation,  At surgeon's request and post-op pain management  Laterality: Right  Prep: chloraprep       Needles:  Injection technique: Single-shot  Needle Type: Echogenic Needle     Needle Length: 9cm  Needle Gauge: 21     Additional Needles:   Narrative:  Start time: 02/24/2024 7:02 AM End time: 02/24/2024 7:10 AM Injection made incrementally with aspirations every 5 mL.  Performed by: Personally  Anesthesiologist: Ellena Gurney, MD  Additional Notes: Pt tolerated the procedure well.

## 2024-02-24 NOTE — Anesthesia Procedure Notes (Signed)
 Spinal  Patient location during procedure: OR Start time: 02/24/2024 7:20 AM End time: 02/24/2024 7:26 AM Reason for block: surgical anesthesia Staffing Performed: anesthesiologist  Anesthesiologist: Ellena Gurney, MD Performed by: Ellena Gurney, MD Authorized by: Ellena Gurney, MD   Preanesthetic Checklist Completed: patient identified, IV checked, risks and benefits discussed, surgical consent, monitors and equipment checked, pre-op evaluation and timeout performed Spinal Block Patient position: sitting Prep: DuraPrep Patient monitoring: cardiac monitor, continuous pulse ox and blood pressure Approach: midline Location: L3-4 Injection technique: single-shot Needle Needle type: Pencan  Needle gauge: 24 G Needle length: 9 cm Assessment Sensory level: T10 Events: CSF return Additional Notes Functioning IV was confirmed and monitors were applied. Sterile prep and drape, including hand hygiene and sterile gloves were used. The patient was positioned and the spine was prepped. The skin was anesthetized with lidocaine .  Free flow of clear CSF was obtained prior to injecting local anesthetic into the CSF.  The spinal needle aspirated freely following injection.  The needle was carefully withdrawn.  The patient tolerated the procedure well.

## 2024-02-24 NOTE — Discharge Instructions (Signed)

## 2024-02-24 NOTE — Anesthesia Preprocedure Evaluation (Signed)
 Anesthesia Evaluation  Patient identified by MRN, date of birth, ID band Patient awake    Reviewed: Allergy & Precautions, H&P , NPO status , Patient's Chart, lab work & pertinent test results  Airway Mallampati: II   Neck ROM: full    Dental   Pulmonary    breath sounds clear to auscultation       Cardiovascular negative cardio ROS  Rhythm:regular Rate:Normal     Neuro/Psych    GI/Hepatic ,GERD  ,,  Endo/Other    Renal/GU      Musculoskeletal  (+) Arthritis ,    Abdominal   Peds  Hematology   Anesthesia Other Findings   Reproductive/Obstetrics                             Anesthesia Physical Anesthesia Plan  ASA: 2  Anesthesia Plan: MAC and Spinal   Post-op Pain Management: Regional block*   Induction: Intravenous  PONV Risk Score and Plan: 1 and Propofol  infusion, Treatment may vary due to age or medical condition and Midazolam   Airway Management Planned: Simple Face Mask  Additional Equipment:   Intra-op Plan:   Post-operative Plan:   Informed Consent: I have reviewed the patients History and Physical, chart, labs and discussed the procedure including the risks, benefits and alternatives for the proposed anesthesia with the patient or authorized representative who has indicated his/her understanding and acceptance.     Dental advisory given  Plan Discussed with: CRNA, Anesthesiologist and Surgeon  Anesthesia Plan Comments:        Anesthesia Quick Evaluation

## 2024-02-24 NOTE — Anesthesia Procedure Notes (Signed)
 Procedure Name: MAC Date/Time: 02/24/2024 7:17 AM  Performed by: Elaina Graver, CRNAPre-anesthesia Checklist: Patient identified, Emergency Drugs available, Suction available, Patient being monitored and Timeout performed Patient Re-evaluated:Patient Re-evaluated prior to induction Oxygen Delivery Method: Nasal cannula Preoxygenation: Pre-oxygenation with 100% oxygen Induction Type: IV induction Placement Confirmation: positive ETCO2 Dental Injury: Teeth and Oropharynx as per pre-operative assessment

## 2024-02-24 NOTE — H&P (Signed)
 TOTAL KNEE ADMISSION H&P  Patient is being admitted for right total knee arthroplasty.  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 Mark Sil about dvt ppx - takes Xanax  at bedtime only - oxycodone , robaxin , tylenol , celebrex     Subjective:  Chief Complaint:right knee pain.  HPI: Justin Hurst, 68 y.o. male, has a history of pain and functional disability in the right knee due to arthritis and has failed non-surgical conservative treatments for greater than 12 weeks to includeNSAID's and/or analgesics, corticosteriod injections, and activity modification.  Onset of symptoms was gradual, starting 2 years ago with gradually worsening course since that time. The patient noted no past surgery on the right knee(s).  Patient currently rates pain in the right knee(s) at 8 out of 10 with activity. Patient has worsening of pain with activity and weight bearing and pain that interferes with activities of daily living.  Patient has evidence of joint space narrowing by imaging studies. There is no active infection.  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 Bernard Brick Left Knee 03-09-18    KNEE ARTHROPLASTY     x2    KNEE ARTHROSCOPY     Left x3   ORIF FEMUR FRACTURE Left 03/09/2018   Procedure: Left knee femoral revision with open reduction internal fixation of distal femur periprosthetic fracture;  Surgeon: Claiborne Crew, MD;  Location: [facility];  Service: Orthopedics;  Laterality: Left;  Adductor Block   ORIF FEMUR FRACTURE Left 06/26/2023   Procedure: OPEN REDUCTION INTERNAL FIXATION (ORIF) DISTAL FEMUR FRACTURE;  Surgeon: Hardy Lia, MD;  Location: [facility];  Service: Orthopedics;  Laterality: Left;   SHOULDER ARTHROSCOPY     Left   TUMOR EXCISION  06/15/2022   brain    Current Facility-Administered Medications  Medication Dose Route Frequency Provider Last Rate Last Admin   ceFAZolin  (ANCEF ) IVPB 2g/100 mL premix  2 g Intravenous On Call to OR Earnie Gola, PA-C       dexamethasone  (DECADRON ) injection 8 mg  8 mg Intravenous Once Savi Lastinger R, PA-C  lactated ringers  infusion   Intravenous Continuous Jonne Netters, MD 10 mL/hr at 02/24/24 2952 New Bag at 02/24/24 0607   povidone-iodine  10 % swab 2 Application  2 Application Topical Once Earnie Gola, PA-C       tranexamic acid  (CYKLOKAPRON ) IVPB 1,000 mg  1,000 mg Intravenous To OR Earnie Gola, PA-C       vancomycin (VANCOCIN) IVPB 1000 mg/200 mL premix  1,000 mg Intravenous On Call to OR Earnie Gola, PA-C 200 mL/hr at 02/24/24 0607 1,000 mg at 02/24/24 8413   No Known Allergies  Social History   Tobacco Use   Smoking status: Never   Smokeless tobacco:  Never  Substance Use Topics   Alcohol use: Yes    Alcohol/week: 10.0 standard drinks of alcohol    Types: 10 Cans of beer per week    Comment: social    Family History  Problem Relation Age of Onset   Cancer Mother    Arthritis Father    Heart disease Father    Vision loss Father    ADD / ADHD Daughter    Cancer Sister      Review of Systems  Constitutional:  Negative for chills and fever.  Respiratory:  Negative for cough and shortness of breath.   Cardiovascular:  Negative for chest pain.  Gastrointestinal:  Negative for nausea and vomiting.  Musculoskeletal:  Positive for arthralgias.     Objective:  Physical Exam Well nourished and well developed. General: Alert and oriented x3, cooperative and pleasant, no acute distress.  Musculoskeletal: Right knee exam: No palpable effusion, warmth erythema Slight genu varum associated with mild flexion contracture Tenderness medially and anteriorly Flexion to 120 degrees  Vital signs in last 24 hours: Weight:  [86.2 kg] 86.2 kg (06/03 0613)  Labs:   Estimated body mass index is 30.67 kg/m as calculated from the following:   Height as of this encounter: 5\' 6"  (1.676 m).   Weight as of this encounter: 86.2 kg.   Imaging Review Plain radiographs demonstrate severe degenerative joint disease of the right knee(s). The overall alignment isneutral. The bone quality appears to be adequate for age and reported activity level.      Assessment/Plan:  End stage arthritis, right knee   The patient history, physical examination, clinical judgment of the provider and imaging studies are consistent with end stage degenerative joint disease of the right knee(s) and total knee arthroplasty is deemed medically necessary. The treatment options including medical management, injection therapy arthroscopy and arthroplasty were discussed at length. The risks and benefits of total knee arthroplasty were presented and reviewed. The risks  due to aseptic loosening, infection, stiffness, patella tracking problems, thromboembolic complications and other imponderables were discussed. The patient acknowledged the explanation, agreed to proceed with the plan and consent was signed. Patient is being admitted for inpatient treatment for surgery, pain control, PT, OT, prophylactic antibiotics, VTE prophylaxis, progressive ambulation and ADL's and discharge planning. The patient is planning to be discharged home.     Patient's anticipated LOS is less than 2 midnights, meeting these requirements: - Younger than 25 - Lives within 1 hour of care - Has a competent adult at home to recover with post-op recover - NO history of  - Chronic pain requiring opiods  - Diabetes  - Coronary Artery Disease  - Heart failure  - Heart attack  - Stroke  - DVT/VTE  - Cardiac arrhythmia  - Respiratory Failure/COPD  - Renal failure  - Anemia  -  Advanced Liver disease  Kim Pen, PA-C Orthopedic Surgery [facility] [place] [phone]

## 2024-02-24 NOTE — Anesthesia Procedure Notes (Signed)
 Date/Time: 02/24/2024 7:25 AM  Performed by: Elaina Graver, CRNAOxygen Delivery Method: Simple face mask Placement Confirmation: positive ETCO2 Dental Injury: Teeth and Oropharynx as per pre-operative assessment

## 2024-02-24 NOTE — Care Plan (Signed)
 Ortho Bundle Case Management Note  Patient Details  Name: Justin Hurst MRN: [id number] Date of Birth: September 30, 1955  R TKA on 02/24/24  DCP: Home with spouse  DME: No needs. Has RW and cane  PT: [facility]                   DME Arranged:  N/A DME Agency:  NA  HH Arranged:    HH Agency:     Additional Comments: Please contact me with any questions of if this plan should need to change.  Bronwen Canon, CCM [facility] [phone]   02/24/2024, 8:10 AM

## 2024-02-24 NOTE — Op Note (Addendum)
 NAME:  Justin Hurst                      MEDICAL RECORD NO.:  [id number]                             FACILITY:  [facility]      PHYSICIAN:  Azalea Lento. Bernard Brick, M.D.  DATE OF BIRTH:  [birth year]      DATE OF PROCEDURE:  02/24/2024                                     OPERATIVE REPORT         PREOPERATIVE DIAGNOSIS:  Right knee osteoarthritis.      POSTOPERATIVE DIAGNOSIS:  Right knee osteoarthritis.      FINDINGS:  The patient was noted to have complete loss of cartilage and   bone-on-bone arthritis with associated osteophytes in the medial and patellofemoral compartments of   the knee with a significant synovitis and associated effusion.  The patient had failed months of conservative treatment including medications, injection therapy, activity modification.     PROCEDURE:  Right total knee replacement.      COMPONENTS USED:  DePuy Attune FB CR MS knee   system, a size 5 femur, 7 tibia, size 10 mm CR MS AOX insert, and 38 anatomic patellar   button.      SURGEON:  Azalea Lento. Bernard Brick, M.D.      ASSISTANT:  Kim Pen, PA-C.      ANESTHESIA:  Regional and Spinal.      SPECIMENS:  None.      COMPLICATION:  None.      DRAINS:  None.  EBL: <100 cc      TOURNIQUET TIME:  29 min at 225 mmHg   The patient was stable to the recovery room.      INDICATION FOR PROCEDURE:  Justin Hurst is a 68 y.o. male patient of   mine.  The patient had been seen, evaluated, and treated for months conservatively in the   office with medication, activity modification, and injections.  The patient had   radiographic changes of bone-on-bone arthritis with endplate sclerosis and osteophytes noted.  Based on the radiographic changes and failed conservative measures, the patient   decided to proceed with definitive treatment, total knee replacement.  Risks of infection, DVT, component failure, need for revision surgery, neurovascular injury were reviewed in the office setting.  The postop course was  reviewed stressing the efforts to maximize post-operative satisfaction and function.  Consent was obtained for benefit of pain   relief.      PROCEDURE IN DETAIL:  The patient was brought to the operative theater.   Once adequate anesthesia, preoperative antibiotics, 1 gm of Vancomycin, 2 gm of Ancef ,1 gm of Tranexamic Acid , and 10 mg of Decadron  administered, the patient was positioned supine with a right thigh tourniquet placed.  The  right lower extremity was prepped and draped in sterile fashion.  A time-   out was performed identifying the patient, planned procedure, and the appropriate extremity.      The right lower extremity was placed in the [facility] leg holder.  The leg was   exsanguinated, tourniquet elevated to 225 mmHg.  A midline incision was   made followed by median parapatellar arthrotomy.  Following initial   exposure,  attention was first directed to the patella.  Precut   measurement was noted to be 21 mm.  I resected down to 14 mm and used a   38 anatomic patellar button to restore patellar height as well as cover the cut surface.      The lug holes were drilled and a metal shim was placed to protect the   patella from retractors and saw blade during the procedure.      At this point, attention was now directed to the femur.  The femoral   canal was opened with a drill, irrigated to try to prevent fat emboli.  An   intramedullary rod was passed at 5 degrees valgus, 9 mm of bone was   resected off the distal femur.  Following this resection, the tibia was   subluxated anteriorly.  Using the extramedullary guide, 2 mm of bone was resected off   the proximal medial tibia.  We confirmed the gap would be   stable medially and laterally with a size 6 spacer block as well as confirmed that the tibial cut was perpendicular in the coronal plane, checking with an alignment rod.      Once this was done, I sized the femur to be a size 5 in the anterior-   posterior dimension, chose a  standard component based on medial and   lateral dimension.  The size 5 rotation block was then pinned in   position anterior referenced using the C-clamp to set rotation.  The   anterior, posterior, and  chamfer cuts were made without difficulty nor   notching making certain that I was along the anterior cortex to help   with flexion gap stability.      The final box cut was made off the lateral aspect of distal femur.      At this point, the tibia was sized to be a size 7.  The size 7 tray was   then pinned in position through the medial third of the tubercle,   drilled, and keel punched.  Trial reduction was now carried with a 5 femur,  7 tibia, a size 10 mm CR MS insert, and the 38 anatomic patella botton.  The knee was brought to full extension with good flexion stability with the patella   tracking through the trochlea without application of pressure.  Given   all these findings the trial components removed.  Final components were   opened and cement was mixed.  The knee was irrigated with normal saline solution and pulse lavage.  The synovial lining was   then injected with 30 cc of 0.25% Marcaine  with epinephrine , 1 cc of Toradol  and 30 cc of NS for a total of 61 cc.     Final implants were then cemented onto cleaned and dried cut surfaces of bone with the knee brought to extension with a size 10 mm CR MS trial insert.      Once the cement had fully cured, excess cement was removed   throughout the knee.  I confirmed that I was satisfied with the range of   motion and stability, and the final size 10 mm CR MS AOX insert was chosen.  It was   placed into the knee.      The tourniquet had been let down at 29 minutes.  No significant   hemostasis was required.  The extensor mechanism was then reapproximated using #1 Vicryl and #1 Stratafix sutures with the knee  in flexion.  The   remaining wound was closed with 2-0 Vicryl and running 4-0 Monocryl.   The knee was cleaned, dried,  dressed sterilely using Dermabond and   Aquacel dressing.  The patient was then   brought to recovery room in stable condition, tolerating the procedure   well.   Please note that Physician Assistant, Kim Pen, PA-C was present for the entirety of the case, and was utilized for pre-operative positioning, peri-operative retractor management, general facilitation of the procedure and for primary wound closure at the end of the case.              Azalea Lento Bernard Brick, M.D.    02/24/2024 6:45 AM

## 2024-02-24 NOTE — Evaluation (Signed)
 Physical Therapy Evaluation Patient Details Name: Justin Hurst MRN: [id number] DOB: 03/28/1956 Today's Date: 02/24/2024  History of Present Illness  68 y.o. male admitted 02/24/24 for R TKA. 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  Clinical Impression  Pt is s/p TKA resulting in the deficits listed below (see PT Problem List). Pt ambulated 60' with RW, no loss of balance. Initiated TKA HEP. Good progress expected.  Pt will benefit from acute skilled PT to increase their independence and safety with mobility to allow discharge.          If plan is discharge home, recommend the following: A little help with walking and/or transfers;A little help with bathing/dressing/bathroom;Assist for transportation;Assistance with cooking/housework;Help with stairs or ramp for entrance   Can travel by private vehicle        Equipment Recommendations None recommended by PT  Recommendations for Other Services       Functional Status Assessment Patient has had a recent decline in their functional status and demonstrates the ability to make significant improvements in function in a reasonable and predictable amount of time.     Precautions / Restrictions Precautions Precautions: Knee;Fall Recall of Precautions/Restrictions: Intact Precaution/Restrictions Comments: multiple falls PTA 2* R knee giving out Restrictions Weight Bearing Restrictions Per Provider Order: No Other Position/Activity Restrictions: WBAT      Mobility  Bed Mobility Overal bed mobility: Modified Independent             General bed mobility comments: HOB up, used rail    Transfers Overall transfer level: Needs assistance Equipment used: Rolling walker (2 wheels) Transfers: Sit to/from Stand Sit to Stand: Min assist, From elevated surface           General transfer comment: VCs hand placement    Ambulation/Gait Ambulation/Gait assistance: Contact guard  assist Gait Distance (Feet): 60 Feet Assistive device: Rolling walker (2 wheels) Gait Pattern/deviations: Step-to pattern, Decreased step length - right, Decreased step length - left, Wide base of support Gait velocity: decr     General Gait Details: VCs sequencing, no loss of balance  Stairs            Wheelchair Mobility     Tilt Bed    Modified Rankin (Stroke Patients Only)       Balance Overall balance assessment: Modified Independent                                           Pertinent Vitals/Pain Pain Assessment Pain Assessment: 0-10 Pain Score: 0-No pain Pain Intervention(s): Ice applied    Home Living Family/patient expects to be discharged to:: Private residence Living Arrangements: Spouse/significant other Available Help at Discharge: Family;Available PRN/intermittently Type of Home: House Home Access: Stairs to enter Entrance Stairs-Rails: Left Entrance Stairs-Number of Steps: 13   Home Layout: Two level;Able to live on main level with bedroom/bathroom Home Equipment: Rolling Walker (2 wheels);Cane - single point;Crutches;Shower seat;Wheelchair - manual      Prior Function Prior Level of Function : Independent/Modified Independent;Driving             Mobility Comments: used SPC vs cane vs crutch prn       Extremity/Trunk Assessment   Upper Extremity Assessment Upper Extremity Assessment: Overall WFL for tasks assessed    Lower Extremity Assessment Lower Extremity Assessment: RLE deficits/detail RLE Deficits / Details:  knee ext -3/5, SLR 3/5, AAROM R knee ~5-55* RLE Sensation: WNL RLE Coordination: WNL       Communication   Communication Communication: No apparent difficulties    Cognition Arousal: Alert Behavior During Therapy: WFL for tasks assessed/performed   PT - Cognitive impairments: No apparent impairments                         Following commands: Intact       Cueing        General Comments      Exercises Total Joint Exercises Ankle Circles/Pumps: AROM, Both, 10 reps, Supine Quad Sets: AROM, Both, 5 reps, Supine Heel Slides: AAROM, Right, 5 reps, Supine   Assessment/Plan    PT Assessment Patient needs continued PT services  PT Problem List Decreased activity tolerance;Decreased strength;Decreased mobility;Decreased balance;Pain       PT Treatment Interventions Gait training;Therapeutic exercise;Therapeutic activities;Functional mobility training;Patient/family education    PT Goals (Current goals can be found in the Care Plan section)  Acute Rehab PT Goals Patient Stated Goal: walk on the trails, decreased pain PT Goal Formulation: With patient Time For Goal Achievement: 03/02/24 Potential to Achieve Goals: Good    Frequency 7X/week     Co-evaluation               AM-PAC PT "6 Clicks" Mobility  Outcome Measure Help needed turning from your back to your side while in a flat bed without using bedrails?: None Help needed moving from lying on your back to sitting on the side of a flat bed without using bedrails?: A Little Help needed moving to and from a bed to a chair (including a wheelchair)?: A Little Help needed standing up from a chair using your arms (e.g., wheelchair or bedside chair)?: A Little Help needed to walk in hospital room?: A Little Help needed climbing 3-5 steps with a railing? : A Little 6 Click Score: 19    End of Session Equipment Utilized During Treatment: Gait belt Activity Tolerance: Patient tolerated treatment well Patient left: in chair;with chair alarm set;with call bell/phone within reach;with family/visitor present Nurse Communication: Mobility status PT Visit Diagnosis: Difficulty in walking, not elsewhere classified (R26.2)    Time: 1353-1411 PT Time Calculation (min) (ACUTE ONLY): 18 min   Charges:   PT Evaluation $PT Eval Moderate Complexity: 1 Mod   PT General Charges $$ ACUTE PT VISIT: 1  Visit        Justin Hurst PT 02/24/2024  Acute Rehabilitation Services  Office [phone]

## 2024-02-24 NOTE — Transfer of Care (Signed)
 Immediate Anesthesia Transfer of Care Note  Patient: Justin Hurst  Procedure(s) Performed: ARTHROPLASTY, KNEE, TOTAL (Right: Knee)  Patient Location: PACU  Anesthesia Type:MAC combined with regional for post-op pain Sinal  Level of Consciousness: awake, alert , and oriented  Airway & Oxygen Therapy: Patient Spontanous Breathing and Patient connected to nasal cannula oxygen  Post-op Assessment: Report given to RN and Post -op Vital signs reviewed and stable  Post vital signs: Reviewed and stable  Last Vitals:  Vitals Value Taken Time  BP 98/65 02/24/24 0900  Temp 36.2 C 02/24/24 0857  Pulse 58 02/24/24 0903  Resp 11 02/24/24 0903  SpO2 93 % 02/24/24 0903  Vitals shown include unfiled device data.  Last Pain:  Vitals:   02/24/24 0857  TempSrc:   PainSc: 0-No pain      Patients Stated Pain Goal: 3 (02/24/24 1884)  Complications: No notable events documented.

## 2024-02-24 NOTE — Interval H&P Note (Signed)
 History and Physical Interval Note:  02/24/2024 6:42 AM  Justin Hurst  has presented today for surgery, with the diagnosis of Right knee osteoarthritis.  The various methods of treatment have been discussed with the patient and family. After consideration of risks, benefits and other options for treatment, the patient has consented to  Procedure(s): ARTHROPLASTY, KNEE, TOTAL (Right) as a surgical intervention.  The patient's history has been reviewed, patient examined, no change in status, stable for surgery.  I have reviewed the patient's chart and labs.  Questions were answered to the patient's satisfaction.     Bevin Bucks

## 2024-02-25 ENCOUNTER — Encounter (HOSPITAL_COMMUNITY): Payer: Self-pay | Admitting: Orthopedic Surgery

## 2024-02-25 DIAGNOSIS — M1711 Unilateral primary osteoarthritis, right knee: Secondary | ICD-10-CM | POA: Diagnosis not present

## 2024-02-25 DIAGNOSIS — Z96652 Presence of left artificial knee joint: Secondary | ICD-10-CM | POA: Diagnosis not present

## 2024-02-25 DIAGNOSIS — Z85828 Personal history of other malignant neoplasm of skin: Secondary | ICD-10-CM | POA: Diagnosis not present

## 2024-02-25 DIAGNOSIS — Z85841 Personal history of malignant neoplasm of brain: Secondary | ICD-10-CM | POA: Diagnosis not present

## 2024-02-25 LAB — CBC
HCT: 37.8 % — ABNORMAL LOW (ref 39.0–52.0)
Hemoglobin: 11.8 g/dL — ABNORMAL LOW (ref 13.0–17.0)
MCH: 31.3 pg (ref 26.0–34.0)
MCHC: 31.2 g/dL (ref 30.0–36.0)
MCV: 100.3 fL — ABNORMAL HIGH (ref 80.0–100.0)
Platelets: 212 10*3/uL (ref 150–400)
RBC: 3.77 MIL/uL — ABNORMAL LOW (ref 4.22–5.81)
RDW: 13.7 % (ref 11.5–15.5)
WBC: 11.5 10*3/uL — ABNORMAL HIGH (ref 4.0–10.5)
nRBC: 0 % (ref 0.0–0.2)

## 2024-02-25 LAB — BASIC METABOLIC PANEL WITH GFR
Anion gap: 12 (ref 5–15)
BUN: 24 mg/dL — ABNORMAL HIGH (ref 8–23)
CO2: 21 mmol/L — ABNORMAL LOW (ref 22–32)
Calcium: 8.6 mg/dL — ABNORMAL LOW (ref 8.9–10.3)
Chloride: 104 mmol/L (ref 98–111)
Creatinine, Ser: 1.05 mg/dL (ref 0.61–1.24)
GFR, Estimated: >60 mL/min (ref >60)
Glucose, Bld: 110 mg/dL — ABNORMAL HIGH (ref 70–99)
Potassium: 4.2 mmol/L (ref 3.5–5.1)
Sodium: 137 mmol/L (ref 135–145)

## 2024-02-25 MED ORDER — ACETAMINOPHEN 500 MG PO TABS: 1000.0000 mg | ORAL_TABLET | Freq: Four times a day (QID) | ORAL | Status: DC

## 2024-02-25 MED ORDER — CELECOXIB 200 MG PO CAPS: 200.0000 mg | ORAL_CAPSULE | Freq: Every day | ORAL | 0 refills | Status: DC

## 2024-02-25 MED ORDER — OXYCODONE HCL 5 MG PO TABS: 5.0000 mg | ORAL_TABLET | ORAL | 0 refills | Status: DC | PRN

## 2024-02-25 MED ORDER — ASPIRIN 81 MG PO CHEW: 81.0000 mg | CHEWABLE_TABLET | Freq: Two times a day (BID) | ORAL | 0 refills | Status: AC

## 2024-02-25 MED ORDER — SENNA 8.6 MG PO TABS: 2.0000 | ORAL_TABLET | Freq: Every day | ORAL | 0 refills | Status: AC

## 2024-02-25 MED ORDER — POLYETHYLENE GLYCOL 3350 17 G PO PACK: 17.0000 g | PACK | Freq: Two times a day (BID) | ORAL | 0 refills | Status: DC

## 2024-02-25 MED ORDER — METHOCARBAMOL 500 MG PO TABS: 500.0000 mg | ORAL_TABLET | Freq: Four times a day (QID) | ORAL | 2 refills | Status: DC | PRN

## 2024-02-25 NOTE — Progress Notes (Signed)
 Physical Therapy Treatment Patient Details Name: Justin Hurst MRN: [id number] DOB: Aug 24, 1956 Today's Date: 02/25/2024   History of Present Illness 68 y.o. male admitted 02/24/24 for R TKA. 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    PT Comments  POD # 1 am session AxO x 3 motivated.  Has had 3 prior knee surgeries.  Knowledgable. Eager to "go home".  Spouse present.  Assisted OOB to amb and practice stairs went well.  General Gait Details: slightly unsteady using safety belt as Spouse "hands on" asisst with amb.  Pt admitted to taking Dilaudid  earlier today due to HIGH PAIN.  Currently pain is 4/10 with amb. General stair comments: with Spouse "hands on" assisted using safety belt practiced multiple stairs using B rails.  Pt performed well. Then returned to room to perform some TE's following HEP handout.  Instructed on proper tech, freq as well as use of ICE MAN MACHINE.   Addressed all mobility questions, discussed appropriate activity, educated on use of ICE.  Pt ready for D/C to home.    If plan is discharge home, recommend the following: A little help with walking and/or transfers;A little help with bathing/dressing/bathroom;Assist for transportation;Assistance with cooking/housework;Help with stairs or ramp for entrance   Can travel by private vehicle        Equipment Recommendations  None recommended by PT    Recommendations for Other Services       Precautions / Restrictions Precautions Precautions: Knee;Fall Recall of Precautions/Restrictions: Intact Precaution/Restrictions Comments: no pillow under knee Restrictions Weight Bearing Restrictions Per Provider Order: No     Mobility  Bed Mobility Overal bed mobility: Modified Independent             General bed mobility comments: self able using belt    Transfers Overall transfer level: Needs assistance Equipment used: Rolling walker (2 wheels) Transfers: Sit  to/from Stand Sit to Stand: Supervision           General transfer comment: VCs hand placement and safety with turns.  Slightly impulsive.    Ambulation/Gait Ambulation/Gait assistance: Supervision, Contact guard assist Gait Distance (Feet): 55 Feet Assistive device: Rolling walker (2 wheels) Gait Pattern/deviations: Step-to pattern, Decreased step length - right, Decreased step length - left, Wide base of support Gait velocity: decreased     General Gait Details: slightly unsteady using safety belt as Spouse "hands on" asisst with amb.  Pt admitted to taking Dilaudid  earlier today due to HIGH PAIN.  Currently pain is 4/10 with amb.   Stairs Stairs: Yes Stairs assistance: Contact guard assist, Min assist Stair Management: Two rails, Step to pattern, Forwards Number of Stairs: 4 General stair comments: with Spouse "hands on" assisted using safety belt practiced multiple stairs using B rails.  Pt performed well.   Wheelchair Mobility     Tilt Bed    Modified Rankin (Stroke Patients Only)       Balance                                            Communication Communication Communication: No apparent difficulties  Cognition Arousal: Alert Behavior During Therapy: WFL for tasks assessed/performed   PT - Cognitive impairments: No apparent impairments  PT - Cognition Comments: AxO x 3 motivated.  Has had 3 prior knee surgeries.  Knowledgable. Following commands: Intact      Cueing Cueing Techniques: Verbal cues  Exercises  Total Knee Replacement TE's following HEP handout 10 reps B LE ankle pumps 05 reps towel squeezes 05 reps knee presses 05 reps heel slides  05 reps SAQ's 05 reps SLR's 05 reps ABD Educated on use of gait belt to assist with TE's Followed by ICE     General Comments        Pertinent Vitals/Pain Pain Assessment Pain Assessment: 0-10 Pain Score: 4  Pain Location: R knee Pain  Descriptors / Indicators: Operative site guarding, Tightness, Tender Pain Intervention(s): Monitored during session, Premedicated before session, Repositioned, Ice applied    Home Living                          Prior Function            PT Goals (current goals can now be found in the care plan section)      Frequency    7X/week      PT Plan      Co-evaluation              AM-PAC PT "6 Clicks" Mobility   Outcome Measure  Help needed turning from your back to your side while in a flat bed without using bedrails?: None Help needed moving from lying on your back to sitting on the side of a flat bed without using bedrails?: None Help needed moving to and from a bed to a chair (including a wheelchair)?: None Help needed standing up from a chair using your arms (e.g., wheelchair or bedside chair)?: None Help needed to walk in hospital room?: A Little Help needed climbing 3-5 steps with a railing? : A Little 6 Click Score: 22    End of Session Equipment Utilized During Treatment: Gait belt Activity Tolerance: Patient tolerated treatment well Patient left: in chair;with chair alarm set;with call bell/phone within reach;with family/visitor present Nurse Communication: Mobility status PT Visit Diagnosis: Difficulty in walking, not elsewhere classified (R26.2)     Time: 1132-1204 PT Time Calculation (min) (ACUTE ONLY): 32 min  Charges:    $Gait Training: 8-22 mins $Therapeutic Exercise: 8-22 mins PT General Charges $$ ACUTE PT VISIT: 1 Visit                     Bess Broody  PTA Acute  Rehabilitation Services Office M-F          [phone]

## 2024-02-25 NOTE — TOC Transition Note (Signed)
 Transition of Care [facility]) - Discharge Note   Patient Details  Name: Justin Hurst MRN: [id number] Date of Birth: [birth year]  Transition of [facility]) CM/SW Contact:  Delilah Fend, LCSW Phone Number: 02/25/2024, 10:03 AM   Clinical Narrative:     Met with pt who confirms he has needed DME in the home.  OPPT already arranged with [facility].  No further TOC needs.  Final next level of care: OP Rehab Barriers to Discharge: No Barriers Identified   Patient Goals and CMS Choice Patient states their goals for this hospitalization and ongoing recovery are:: return home          Discharge Placement                       Discharge Plan and Services Additional resources added to the After Visit Summary for                  DME Arranged: N/A DME Agency: NA                  Social Drivers of Health (SDOH) Interventions SDOH Screenings   Food Insecurity: No Food Insecurity (02/24/2024)  Housing: Low Risk  (02/24/2024)  Transportation Needs: No Transportation Needs (02/24/2024)  Utilities: Not At Risk (02/24/2024)  Social Connections: Moderately Isolated (02/25/2024)  Tobacco Use: Low Risk  (02/24/2024)     Readmission Risk Interventions     No data to display

## 2024-02-25 NOTE — Plan of Care (Signed)

## 2024-02-25 NOTE — Care Management Obs Status (Signed)
 MEDICARE OBSERVATION STATUS NOTIFICATION   Patient Details  Name: Justin Hurst MRN: [id number] Date of Birth: August 30, 1956   Medicare Observation Status Notification Given:  Yes    Delilah Fend, LCSW 02/25/2024, 10:26 AM

## 2024-02-26 ENCOUNTER — Encounter (HOSPITAL_COMMUNITY): Payer: Self-pay | Admitting: Orthopedic Surgery

## 2024-02-26 NOTE — Anesthesia Postprocedure Evaluation (Signed)
 Anesthesia Post Note  Patient: Justin Hurst  Procedure(s) Performed: ARTHROPLASTY, KNEE, TOTAL (Right: Knee)     Patient location during evaluation: PACU Anesthesia Type: MAC, Spinal and Regional Level of consciousness: oriented and awake and alert Pain management: pain level controlled Vital Signs Assessment: post-procedure vital signs reviewed and stable Respiratory status: spontaneous breathing, respiratory function stable and patient connected to nasal cannula oxygen Cardiovascular status: blood pressure returned to baseline and stable Postop Assessment: no headache, no backache and no apparent nausea or vomiting Anesthetic complications: no   No notable events documented.  Last Vitals:  Vitals:   02/25/24 0551 02/25/24 1022  BP: 103/70 112/70  Pulse: (!) 54 (!) 51  Resp: 16 18  Temp: 36.5 C 36.7 C  SpO2: 97% 98%    Last Pain:  Vitals:   02/25/24 1212  TempSrc:   PainSc: 4                  Kaladin Noseworthy S

## 2024-03-03 DIAGNOSIS — Z85841 Personal history of malignant neoplasm of brain: Secondary | ICD-10-CM | POA: Diagnosis not present

## 2024-03-03 DIAGNOSIS — I10 Essential (primary) hypertension: Secondary | ICD-10-CM | POA: Diagnosis not present

## 2024-03-03 DIAGNOSIS — K219 Gastro-esophageal reflux disease without esophagitis: Secondary | ICD-10-CM | POA: Diagnosis not present

## 2024-03-03 DIAGNOSIS — Z96651 Presence of right artificial knee joint: Secondary | ICD-10-CM | POA: Diagnosis not present

## 2024-03-03 DIAGNOSIS — Z471 Aftercare following joint replacement surgery: Secondary | ICD-10-CM | POA: Diagnosis not present

## 2024-03-03 DIAGNOSIS — Z791 Long term (current) use of non-steroidal anti-inflammatories (NSAID): Secondary | ICD-10-CM | POA: Diagnosis not present

## 2024-03-03 DIAGNOSIS — Z9181 History of falling: Secondary | ICD-10-CM | POA: Diagnosis not present

## 2024-03-03 DIAGNOSIS — Z96652 Presence of left artificial knee joint: Secondary | ICD-10-CM | POA: Diagnosis not present

## 2024-03-03 DIAGNOSIS — Z85828 Personal history of other malignant neoplasm of skin: Secondary | ICD-10-CM | POA: Diagnosis not present

## 2024-03-03 DIAGNOSIS — G894 Chronic pain syndrome: Secondary | ICD-10-CM | POA: Diagnosis not present

## 2024-03-05 DIAGNOSIS — Z85828 Personal history of other malignant neoplasm of skin: Secondary | ICD-10-CM | POA: Diagnosis not present

## 2024-03-05 DIAGNOSIS — I10 Essential (primary) hypertension: Secondary | ICD-10-CM | POA: Diagnosis not present

## 2024-03-05 DIAGNOSIS — Z96652 Presence of left artificial knee joint: Secondary | ICD-10-CM | POA: Diagnosis not present

## 2024-03-05 DIAGNOSIS — Z471 Aftercare following joint replacement surgery: Secondary | ICD-10-CM | POA: Diagnosis not present

## 2024-03-05 DIAGNOSIS — Z791 Long term (current) use of non-steroidal anti-inflammatories (NSAID): Secondary | ICD-10-CM | POA: Diagnosis not present

## 2024-03-05 DIAGNOSIS — K219 Gastro-esophageal reflux disease without esophagitis: Secondary | ICD-10-CM | POA: Diagnosis not present

## 2024-03-05 DIAGNOSIS — G894 Chronic pain syndrome: Secondary | ICD-10-CM | POA: Diagnosis not present

## 2024-03-05 DIAGNOSIS — Z85841 Personal history of malignant neoplasm of brain: Secondary | ICD-10-CM | POA: Diagnosis not present

## 2024-03-05 DIAGNOSIS — Z96651 Presence of right artificial knee joint: Secondary | ICD-10-CM | POA: Diagnosis not present

## 2024-03-05 DIAGNOSIS — Z9181 History of falling: Secondary | ICD-10-CM | POA: Diagnosis not present

## 2024-03-08 DIAGNOSIS — Z471 Aftercare following joint replacement surgery: Secondary | ICD-10-CM | POA: Diagnosis not present

## 2024-03-08 DIAGNOSIS — Z96652 Presence of left artificial knee joint: Secondary | ICD-10-CM | POA: Diagnosis not present

## 2024-03-08 DIAGNOSIS — K219 Gastro-esophageal reflux disease without esophagitis: Secondary | ICD-10-CM | POA: Diagnosis not present

## 2024-03-08 DIAGNOSIS — G894 Chronic pain syndrome: Secondary | ICD-10-CM | POA: Diagnosis not present

## 2024-03-08 DIAGNOSIS — I10 Essential (primary) hypertension: Secondary | ICD-10-CM | POA: Diagnosis not present

## 2024-03-08 DIAGNOSIS — Z96651 Presence of right artificial knee joint: Secondary | ICD-10-CM | POA: Diagnosis not present

## 2024-03-08 DIAGNOSIS — Z9181 History of falling: Secondary | ICD-10-CM | POA: Diagnosis not present

## 2024-03-08 DIAGNOSIS — Z791 Long term (current) use of non-steroidal anti-inflammatories (NSAID): Secondary | ICD-10-CM | POA: Diagnosis not present

## 2024-03-08 DIAGNOSIS — Z85841 Personal history of malignant neoplasm of brain: Secondary | ICD-10-CM | POA: Diagnosis not present

## 2024-03-08 DIAGNOSIS — Z85828 Personal history of other malignant neoplasm of skin: Secondary | ICD-10-CM | POA: Diagnosis not present

## 2024-03-08 NOTE — Discharge Summary (Signed)
 Patient ID: Justin Hurst MRN: [id number] DOB/AGE: 1955/10/07 68 y.o.  Admit date: 02/24/2024 Discharge date: 02/25/2024  Admission Diagnoses:  Right knee osteoarthritis  Discharge Diagnoses:  Principal Problem:   S/P total knee replacement, right Active Problems:   S/P total knee arthroplasty, right   Past Medical History:  Diagnosis Date   Arthritis    Brain cancer (HCC)    Cancer (HCC)    hx of brain cancer- tx in 2023   Dyspnea    GERD (gastroesophageal reflux disease)    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   Skin cancer     Surgeries: Procedure(s): ARTHROPLASTY, KNEE, TOTAL on 02/24/2024   Consultants:   Discharged Condition: Improved  Hospital Course: TARRIS DELBENE is an 68 y.o. male who was admitted 02/24/2024 for operative treatment ofS/P total knee replacement, right. Patient has severe unremitting pain that affects sleep, daily activities, and work/hobbies. After pre-op clearance the patient was taken to the operating room on 02/24/2024 and underwent  Procedure(s): ARTHROPLASTY, KNEE, TOTAL.    Patient was given perioperative antibiotics:  Anti-infectives (From admission, onward)    Start     Dose/Rate Route Frequency Ordered Stop   02/24/24 1430  ceFAZolin  (ANCEF ) IVPB 2g/100 mL premix        2 g 200 mL/hr over 30 Minutes Intravenous Every 6 hours 02/24/24 1320 02/25/24 1051   02/24/24 0600  ceFAZolin  (ANCEF ) IVPB 2g/100 mL premix        2 g 200 mL/hr over 30 Minutes Intravenous On call to O.R. 02/24/24 0557 02/24/24 0747   02/24/24 0600  vancomycin  (VANCOCIN ) IVPB 1000 mg/200 mL premix        1,000 mg 200 mL/hr over 60 Minutes Intravenous On call to O.R. 02/24/24 0557 02/24/24 3244        Patient was given sequential compression devices, early ambulation, and chemoprophylaxis to prevent DVT. Patient worked with PT and was meeting their goals regarding safe ambulation and transfers.  Patient benefited maximally from hospital stay and there were no complications.     Recent vital signs: No data found.   Recent laboratory studies: No results for input(s): WBC, HGB, HCT, PLT, NA, K, CL, CO2, BUN, CREATININE, GLUCOSE, INR, CALCIUM in the last 72 hours.  Invalid input(s): PT, 2   Discharge Medications:   Allergies as of 02/25/2024   No Known Allergies      Medication List     STOP taking these medications    atorvastatin 10 MG tablet Commonly known as: LIPITOR   diclofenac 75 MG EC tablet Commonly known as: VOLTAREN   ibuprofen 200 MG tablet Commonly known as: ADVIL       TAKE these medications    acetaminophen  500 MG tablet Commonly known as: TYLENOL  Take 2 tablets (1,000 mg total) by mouth every 6 (six) hours.   ALPRAZolam  1 MG tablet Commonly known as: XANAX  Take 1 tablet (1 mg total) by mouth at bedtime.   amoxicillin 500 MG capsule Commonly known as: AMOXIL Take 2,000 mg by mouth See admin instructions. Take 4 capsules (2000 mg) by mouth 1 hour prior to dental appointments   aspirin  81 MG chewable tablet Chew 1 tablet (81 mg total) by mouth 2 (two) times daily for 28 days.   carboxymethylcellulose 0.5 % Soln Commonly known as: REFRESH PLUS Place 1-2 drops into both eyes 3 (three) times daily as needed (dry/irritated eyes.).   celecoxib  200 MG capsule Commonly known as: CELEBREX  Take 1 capsule (200 mg total) by mouth  daily.   chlorhexidine  4 % external liquid Commonly known as: HIBICLENS  Apply 15 mLs (1 Application total) topically as directed for 30 doses. Use as directed daily for 5 days every other week for 6 weeks.   methocarbamol  500 MG tablet Commonly known as: ROBAXIN  Take 1 tablet (500 mg total) by mouth every 6 (six) hours as needed for muscle spasms.   mupirocin  ointment 2 % Commonly known as: BACTROBAN  Place 1 Application into the nose 2 (two) times daily for 60 doses. Use as directed 2 times daily for 5 days every other week for 6 weeks.   oxyCODONE  5 MG immediate  release tablet Commonly known as: Oxy IR/ROXICODONE  Take 1-2 tablets (5-10 mg total) by mouth every 4 (four) hours as needed for severe pain (pain score 7-10).   polyethylene glycol 17 g packet Commonly known as: MIRALAX  / GLYCOLAX  Take 17 g by mouth 2 (two) times daily.   senna 8.6 MG Tabs tablet Commonly known as: SENOKOT Take 2 tablets (17.2 mg total) by mouth at bedtime for 14 days.   sertraline  100 MG tablet Commonly known as: ZOLOFT  Take 200 mg by mouth at bedtime.   tamsulosin  0.4 MG Caps capsule Commonly known as: FLOMAX  Take 0.4 mg by mouth at bedtime.   VITAMIN D3 PO Take 1 tablet by mouth every evening.               Discharge Care Instructions  (From admission, onward)           Start     Ordered   02/25/24 0000  Change dressing       Comments: Maintain surgical dressing until follow up in the clinic. If the edges start to pull up, may reinforce with tape. If the dressing is no longer working, may remove and cover with gauze and tape, but must keep the area dry and clean.  Call with any questions or concerns.   02/25/24 0810            Diagnostic Studies: No results found.  Disposition: Discharge disposition: 01-Home or Self Care       Discharge Instructions     Call MD / Call 911   Complete by: As directed    If you experience chest pain or shortness of breath, CALL 911 and be transported to the hospital emergency room.  If you develope a fever above 101 F, pus (white drainage) or increased drainage or redness at the wound, or calf pain, call your surgeon's office.   Change dressing   Complete by: As directed    Maintain surgical dressing until follow up in the clinic. If the edges start to pull up, may reinforce with tape. If the dressing is no longer working, may remove and cover with gauze and tape, but must keep the area dry and clean.  Call with any questions or concerns.   Constipation Prevention   Complete by: As directed     Drink plenty of fluids.  Prune juice may be helpful.  You may use a stool softener, such as Colace (over the counter) 100 mg twice a day.  Use MiraLax  (over the counter) for constipation as needed.   Diet - low sodium heart healthy   Complete by: As directed    Increase activity slowly as tolerated   Complete by: As directed    Weight bearing as tolerated with assist device (walker, cane, etc) as directed, use it as long as suggested by your surgeon or therapist, typically  at least 4-6 weeks.   Post-operative opioid taper instructions:   Complete by: As directed    POST-OPERATIVE OPIOID TAPER INSTRUCTIONS: It is important to wean off of your opioid medication as soon as possible. If you do not need pain medication after your surgery it is ok to stop day one. Opioids include: Codeine, Hydrocodone (Norco, Vicodin), Oxycodone (Percocet, oxycontin ) and hydromorphone  amongst others.  Long term and even short term use of opiods can cause: Increased pain response Dependence Constipation Depression Respiratory depression And more.  Withdrawal symptoms can include Flu like symptoms Nausea, vomiting And more Techniques to manage these symptoms Hydrate well Eat regular healthy meals Stay active Use relaxation techniques(deep breathing, meditating, yoga) Do Not substitute Alcohol  to help with tapering If you have been on opioids for less than two weeks and do not have pain than it is ok to stop all together.  Plan to wean off of opioids This plan should start within one week post op of your joint replacement. Maintain the same interval or time between taking each dose and first decrease the dose.  Cut the total daily intake of opioids by one tablet each day Next start to increase the time between doses. The last dose that should be eliminated is the evening dose.      TED hose   Complete by: As directed    Use stockings (TED hose) for 2 weeks on both leg(s).  You may remove them at night  for sleeping.        Follow-up Information     Earnie Gola, PA-C. Schedule an appointment as soon as possible for a visit in 2 week(s).   Specialty: Orthopedic Surgery Contact information: [address] [place] [address] [place] [address] [phone]         Alan All.. Go on 02/27/2024.   Why: You are scheduled for physical therapy Friday 02/27/24 at 10:15am; [address] Contact information: [address] [place] [address] [phone]                  Signed: Earnie Gola 03/08/2024, 2:45 PM

## 2024-03-10 DIAGNOSIS — Z85841 Personal history of malignant neoplasm of brain: Secondary | ICD-10-CM | POA: Diagnosis not present

## 2024-03-10 DIAGNOSIS — Z85828 Personal history of other malignant neoplasm of skin: Secondary | ICD-10-CM | POA: Diagnosis not present

## 2024-03-10 DIAGNOSIS — Z96651 Presence of right artificial knee joint: Secondary | ICD-10-CM | POA: Diagnosis not present

## 2024-03-10 DIAGNOSIS — Z96652 Presence of left artificial knee joint: Secondary | ICD-10-CM | POA: Diagnosis not present

## 2024-03-10 DIAGNOSIS — Z471 Aftercare following joint replacement surgery: Secondary | ICD-10-CM | POA: Diagnosis not present

## 2024-03-10 DIAGNOSIS — G894 Chronic pain syndrome: Secondary | ICD-10-CM | POA: Diagnosis not present

## 2024-03-10 DIAGNOSIS — Z9181 History of falling: Secondary | ICD-10-CM | POA: Diagnosis not present

## 2024-03-10 DIAGNOSIS — K219 Gastro-esophageal reflux disease without esophagitis: Secondary | ICD-10-CM | POA: Diagnosis not present

## 2024-03-10 DIAGNOSIS — I10 Essential (primary) hypertension: Secondary | ICD-10-CM | POA: Diagnosis not present

## 2024-03-10 DIAGNOSIS — Z791 Long term (current) use of non-steroidal anti-inflammatories (NSAID): Secondary | ICD-10-CM | POA: Diagnosis not present

## 2024-03-12 DIAGNOSIS — I10 Essential (primary) hypertension: Secondary | ICD-10-CM | POA: Diagnosis not present

## 2024-03-12 DIAGNOSIS — Z791 Long term (current) use of non-steroidal anti-inflammatories (NSAID): Secondary | ICD-10-CM | POA: Diagnosis not present

## 2024-03-12 DIAGNOSIS — K219 Gastro-esophageal reflux disease without esophagitis: Secondary | ICD-10-CM | POA: Diagnosis not present

## 2024-03-12 DIAGNOSIS — Z85828 Personal history of other malignant neoplasm of skin: Secondary | ICD-10-CM | POA: Diagnosis not present

## 2024-03-12 DIAGNOSIS — Z85841 Personal history of malignant neoplasm of brain: Secondary | ICD-10-CM | POA: Diagnosis not present

## 2024-03-12 DIAGNOSIS — Z9181 History of falling: Secondary | ICD-10-CM | POA: Diagnosis not present

## 2024-03-12 DIAGNOSIS — Z96652 Presence of left artificial knee joint: Secondary | ICD-10-CM | POA: Diagnosis not present

## 2024-03-12 DIAGNOSIS — G894 Chronic pain syndrome: Secondary | ICD-10-CM | POA: Diagnosis not present

## 2024-03-12 DIAGNOSIS — Z471 Aftercare following joint replacement surgery: Secondary | ICD-10-CM | POA: Diagnosis not present

## 2024-03-12 DIAGNOSIS — Z96651 Presence of right artificial knee joint: Secondary | ICD-10-CM | POA: Diagnosis not present

## 2024-03-16 DIAGNOSIS — M25661 Stiffness of right knee, not elsewhere classified: Secondary | ICD-10-CM | POA: Diagnosis not present

## 2024-03-19 DIAGNOSIS — M25661 Stiffness of right knee, not elsewhere classified: Secondary | ICD-10-CM | POA: Diagnosis not present

## 2024-03-23 DIAGNOSIS — M25661 Stiffness of right knee, not elsewhere classified: Secondary | ICD-10-CM | POA: Diagnosis not present

## 2024-03-25 DIAGNOSIS — M25661 Stiffness of right knee, not elsewhere classified: Secondary | ICD-10-CM | POA: Diagnosis not present

## 2024-03-30 DIAGNOSIS — M25661 Stiffness of right knee, not elsewhere classified: Secondary | ICD-10-CM | POA: Diagnosis not present

## 2024-04-01 ENCOUNTER — Other Ambulatory Visit

## 2024-04-01 DIAGNOSIS — M25661 Stiffness of right knee, not elsewhere classified: Secondary | ICD-10-CM | POA: Diagnosis not present

## 2024-04-08 DIAGNOSIS — M25661 Stiffness of right knee, not elsewhere classified: Secondary | ICD-10-CM | POA: Diagnosis not present

## 2024-04-14 DIAGNOSIS — Z5189 Encounter for other specified aftercare: Secondary | ICD-10-CM | POA: Diagnosis not present

## 2024-04-15 DIAGNOSIS — M25661 Stiffness of right knee, not elsewhere classified: Secondary | ICD-10-CM | POA: Diagnosis not present

## 2024-04-19 ENCOUNTER — Ambulatory Visit: Admitting: Internal Medicine

## 2024-04-21 DIAGNOSIS — M25661 Stiffness of right knee, not elsewhere classified: Secondary | ICD-10-CM | POA: Diagnosis not present

## 2024-04-22 DIAGNOSIS — M1711 Unilateral primary osteoarthritis, right knee: Secondary | ICD-10-CM | POA: Diagnosis not present

## 2024-04-22 DIAGNOSIS — E7800 Pure hypercholesterolemia, unspecified: Secondary | ICD-10-CM | POA: Diagnosis not present

## 2024-04-27 DIAGNOSIS — M25661 Stiffness of right knee, not elsewhere classified: Secondary | ICD-10-CM | POA: Diagnosis not present

## 2024-04-30 ENCOUNTER — Ambulatory Visit
Admission: RE | Admit: 2024-04-30 | Discharge: 2024-04-30 | Disposition: A | Discharge status: Home / Self Care | Source: Ambulatory Visit | Attending: Internal Medicine | Admitting: Internal Medicine

## 2024-04-30 DIAGNOSIS — M25661 Stiffness of right knee, not elsewhere classified: Secondary | ICD-10-CM | POA: Diagnosis not present

## 2024-04-30 DIAGNOSIS — E611 Iron deficiency: Secondary | ICD-10-CM | POA: Diagnosis not present

## 2024-04-30 DIAGNOSIS — C410 Malignant neoplasm of bones of skull and face: Secondary | ICD-10-CM

## 2024-04-30 DIAGNOSIS — H6121 Impacted cerumen, right ear: Secondary | ICD-10-CM | POA: Diagnosis not present

## 2024-04-30 DIAGNOSIS — R42 Dizziness and giddiness: Secondary | ICD-10-CM | POA: Diagnosis not present

## 2024-04-30 DIAGNOSIS — C719 Malignant neoplasm of brain, unspecified: Secondary | ICD-10-CM | POA: Diagnosis not present

## 2024-04-30 MED ORDER — GADOPICLENOL 0.5 MMOL/ML IV SOLN
9.0000 mL | Freq: Once | INTRAVENOUS | Status: AC | PRN
  Administered 2024-04-30: 9 mL via INTRAVENOUS

## 2024-05-04 DIAGNOSIS — M25661 Stiffness of right knee, not elsewhere classified: Secondary | ICD-10-CM | POA: Diagnosis not present

## 2024-05-06 ENCOUNTER — Inpatient Hospital Stay: Attending: Internal Medicine | Admitting: Internal Medicine

## 2024-05-06 VITALS — BP 133/91 | HR 77 | Temp 98.1°F | Resp 19 | Wt 201.9 lb

## 2024-05-06 DIAGNOSIS — C410 Malignant neoplasm of bones of skull and face: Secondary | ICD-10-CM

## 2024-05-06 DIAGNOSIS — C412 Malignant neoplasm of vertebral column: Secondary | ICD-10-CM | POA: Diagnosis not present

## 2024-05-06 DIAGNOSIS — Z923 Personal history of irradiation: Secondary | ICD-10-CM | POA: Insufficient documentation

## 2024-05-06 NOTE — Progress Notes (Signed)
 [facility] [address]  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)  06/06/2022 Surgery   Transnasal debulking resection with Dr. Deatrice at [facility]   09/24/2022 - 11/19/2022 Radiation Therapy   Proton beam RT at [facility], 70-70Gy    From [facility]: 10/2021: developed progressive double vision and horizontal diplopia 11/14/21: MRI brain/orbits with heterogeneous 2.0 cm cystic lesion involving the clivus with prepontine extension, abutment of the basilar artery, and mass effect on the adjacent pons. No pontine edema. 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 CTA head with fetal origin of bilateral PCA with diffusely diminutive/small caliber posterior circulation, clival osseous erosion with posterior projecting soft tissue mass. 05/14/22: MRI brain and total spine with stable appearance of clival enhancing mass without e/o drop metastases 06/06/22: undergoes endoscopic endonasal transclival tumor debulking with abdominal fat graft with Dr. Deatrice (NSGY) and Dr. Adil (OHNS) at [facility]. Some tumor adherent to the pons/basilar artery and could not be resected. No operative complications. Pathology c/w conventional chordoma, tumor cells positive for Brachyury, EMA, S100. BAF 47 retained. Ki-67 < 5% 06/07/22: MRI brain with postsurgical changes with debulking of clival mass with improved mass effect.  Small amount of surrounding mildly enhancing soft tissue anterior to the pons may reflect residual tumor. Radiation delayed due to insurance approval. 09/04/22: MRI brain with evolving surgical changes, residual prepontine soft tissue around the R lateral aspect of the basilar artery (0.8 x 0.6 cm), nodular enhancing soft tissue along the R clivus involving the region of Dorello's canal and along the posterior aspect of the R cavernous sinus unchanged, may also reflect residual tumor 09/10/22: Consult with Dr. Clotilda Snide ([facility]) for discussion of proton therapy, plan for 70-76 Gy to residual tumor 09/24/22: Starts RT   Biomarkers:  Interval History: Justin Hurst presents today for follow up after recent MRI brain.  Today he reports some episodes of positional veritgo associated with sudden standing.  These appear to have resolved since he had ear was bolus extracted.  Has been dosing meclizine as well.  He otherwise denies headaches, seizures.  Did well overall with his knee replacement, still doing PT.  H+P (12/09/23) Patient presents today for follow up of recently treated chordoma.  Radiation was completed (protons) at [facility] 1 year ago.  He denies any neurologic symptoms aside from mild occasional dizziness.  No additional double vision, which had been his presenting symptom.  No headaches or seizures.  His main functional limitation at this time is from knee pain, he has upcoming knee replacement scheduled.    Medications: Current Outpatient Medications on File Prior to Visit  Medication Sig Dispense Refill   acetaminophen (TYLENOL) 500 MG tablet Take 2 tablets (1,000 mg total) by mouth every 6 (six) hours.     ALPRAZolam (XANAX) 1 MG tablet Take 1 tablet (1 mg total) by mouth at bedtime. 5 tablet 0   amoxicillin (  AMOXIL) 500 MG capsule Take 2,000 mg by mouth See admin instructions. Take 4 capsules (2000 mg) by mouth 1 hour prior to dental appointments      carboxymethylcellulose (REFRESH PLUS) 0.5 % SOLN Place 1-2 drops into both eyes 3 (three) times daily as needed (dry/irritated eyes.).     celecoxib (CELEBREX) 200 MG capsule Take 1 capsule (200 mg total) by mouth daily. 30 capsule 0   chlorhexidine (HIBICLENS) 4 % external liquid Apply 15 mLs (1 Application total) topically as directed for 30 doses. Use as directed daily for 5 days every other week for 6 weeks. 946 mL 1   Cholecalciferol (VITAMIN D3 PO) Take 1 tablet by mouth every evening.     methocarbamol (ROBAXIN) 500 MG tablet Take 1 tablet (500 mg total) by mouth every 6 (six) hours as needed for muscle spasms. 40 tablet 2   oxyCODONE (OXY IR/ROXICODONE) 5 MG immediate release tablet Take 1-2 tablets (5-10 mg total) by mouth every 4 (four) hours as needed for severe pain (pain score 7-10). 42 tablet 0   polyethylene glycol (MIRALAX / GLYCOLAX) 17 g packet Take 17 g by mouth 2 (two) times daily. 14 each 0   sertraline (ZOLOFT) 100 MG tablet Take 200 mg by mouth at bedtime.     tamsulosin (FLOMAX) 0.4 MG CAPS capsule Take 0.4 mg by mouth at bedtime.     No current facility-administered medications on file prior to visit.    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 Ernie Left Knee 03-09-18    KNEE ARTHROPLASTY     x2    KNEE ARTHROSCOPY     Left x3   ORIF FEMUR FRACTURE Left 03/09/2018   Procedure: Left knee femoral revision with open reduction internal fixation of distal femur periprosthetic fracture;  Surgeon: Ernie Cough, MD;  Location: [facility];  Service: Orthopedics;   Laterality: Left;  Adductor Block   ORIF FEMUR FRACTURE Left 06/26/2023   Procedure: OPEN REDUCTION INTERNAL FIXATION (ORIF) DISTAL FEMUR FRACTURE;  Surgeon: Celena Sharper, MD;  Location: [facility] OR;  Service: Orthopedics;  Laterality: Left;   SHOULDER ARTHROSCOPY     Left   TOTAL KNEE ARTHROPLASTY Right 02/24/2024   Procedure: ARTHROPLASTY, KNEE, TOTAL;  Surgeon: Ernie Cough, MD;  Location: [facility];  Service: Orthopedics;  Laterality: Right;   TUMOR EXCISION  06/15/2022   brain   Social History:  Social History   Socioeconomic History   Marital status: Married    Spouse name: Not on file   Number of children: Not on file   Years of education: Not on file   Highest education level: Not on file  Occupational History   Not on file  Tobacco Use   Smoking status: Never   Smokeless tobacco: Never  Vaping Use   Vaping status: Never Used  Substance and Sexual Activity   Alcohol use: Yes    Alcohol/week: 10.0 standard drinks of  alcohol    Types: 10 Cans of beer per week    Comment: social   Drug use: Never   Sexual activity: Yes  Other Topics Concern   Not on file  Social History Narrative   Not on file   Social Drivers of Health   Financial Resource Strain: Not on file  Food Insecurity: No Food Insecurity (02/24/2024)   Hunger Vital Sign    Worried About Running Out of Food in the Last Year: Never true    Ran Out of Food in the Last Year: Never true  Transportation Needs: No Transportation Needs (02/24/2024)   PRAPARE - Administrator, Civil Service (Medical): No    Lack of Transportation (Non-Medical): No  Physical Activity: Not on file  Stress: Not on file  Social Connections: Moderately Isolated (02/25/2024)   Social Connection and Isolation Panel    Frequency of Communication with Friends and Family: More than three times a week    Frequency of Social Gatherings with Friends and Family: More than three times a week    Attends Religious Services: Never    Automotive engineer or Organizations: No    Attends Banker Meetings: Never    Marital Status: Married  Catering manager Violence: Not At Risk (02/24/2024)   Humiliation, Afraid, Rape, and Kick questionnaire    Fear of Current or Ex-Partner: No    Emotionally Abused: No    Physically Abused: No    Sexually Abused: No   Family History:  Family History  Problem Relation Age of Onset   Cancer Mother    Arthritis Father    Heart disease Father    Vision loss Father    ADD / ADHD Daughter    Cancer Sister     Review of Systems: Constitutional: Doesn't report fevers, chills or abnormal weight loss Eyes: Doesn't report blurriness of vision Ears, nose, mouth, throat, and face: Doesn't report sore throat Respiratory: Doesn't report cough, dyspnea or wheezes Cardiovascular: Doesn't report palpitation, chest discomfort  Gastrointestinal:  Doesn't report nausea, constipation, diarrhea GU: Doesn't report incontinence Skin: Doesn't report skin rashes Neurological: Per HPI Musculoskeletal: Doesn't report joint pain Behavioral/Psych: Doesn't report anxiety  Physical Exam: Vitals:   05/06/24 1405 05/06/24 1407  BP: (!) 140/95 (!) 133/91  Pulse: 77   Resp: 19   Temp: 98.1 F (36.7 C)   SpO2: 97%     KPS: 80. General: Alert, cooperative, pleasant, in no acute distress Head: Normal EENT: No conjunctival injection or scleral icterus.  Lungs: Resp effort normal Cardiac: Regular rate Abdomen: Non-distended abdomen Skin: No rashes cyanosis or petechiae. Extremities: No clubbing or edema  Neurologic Exam: Mental Status: Awake, alert, attentive to examiner. Oriented to self and environment. Language is fluent with intact comprehension.  Cranial Nerves: Visual acuity is grossly normal. Visual fields are full. Extra-ocular movements intact. No ptosis. Face is symmetric Motor: Tone and bulk are normal. Power is full in both arms and legs. Reflexes are symmetric, no pathologic  reflexes present.  Sensory: Intact to light touch Gait: Orthopedic limitations   Labs: I have reviewed the data as listed    Component Value Date/Time   NA 137 02/25/2024 0329   NA 142 11/09/2021 0932   K 4.2 02/25/2024 0329   CL 104 02/25/2024 0329   CO2 21 (L) 02/25/2024 0329   GLUCOSE 110 (H) 02/25/2024 0329   BUN 24 (H) 02/25/2024 0329   BUN 16 11/09/2021 0932   CREATININE  1.05 02/25/2024 0329   CALCIUM 8.6 (L) 02/25/2024 0329   PROT 6.7 06/26/2023 0516   ALBUMIN 3.4 (L) 06/26/2023 0516   AST 24 06/26/2023 0516   ALT 27 06/26/2023 0516   ALKPHOS 91 06/26/2023 0516   BILITOT 0.8 06/26/2023 0516   GFRNONAA >60 02/25/2024 0329   GFRAA >60 03/10/2018 0551   Lab Results  Component Value Date   WBC 11.5 (H) 02/25/2024   NEUTROABS 12.5 (H) 06/25/2023   HGB 11.8 (L) 02/25/2024   HCT 37.8 (L) 02/25/2024   MCV 100.3 (H) 02/25/2024   PLT 212 02/25/2024    Imaging: [facility] Clinician Interpretation: I have personally reviewed the CNS images as listed.  My interpretation, in the context of the patient's clinical presentation, is treatment effect vs true progression  MR BRAIN W WO CONTRAST Result Date: 04/30/2024 EXAM: MRI BRAIN WITH AND WITHOUT CONTRAST 04/30/2024 02:45:44 PM TECHNIQUE: Multiplanar multisequence MRI of the head/brain was performed with and without the administration of intravenous contrast. COMPARISON: Brain MRI 12/31/2023. CLINICAL HISTORY: Brain/CNS neoplasm, assess treatment response. SRS RE STAGING; Mri brain w wo; 9 ml vueway; 1 ml wasted; history of dorsal clivus; mass involving the ventral basilar cisterns, partially encasing the basilar artery. Chordoma treated with surgery in 2023, radiation in 2024. Patient complains of dizziness. FINDINGS: BRAIN AND VENTRICLES: No acute infarct. No acute intracranial hemorrhage. No mass effect or midline shift. No hydrocephalus. Similar postoperative appearance from prior resection of a tumor in the clivus. New 12 mm nodular  enhancement along the lateral aspect of the right cavernous sinus with extension into the mesial right temporal lobe best seen on the axial images 46 through 50 of series 14 with surrounding T2 hyperintensity on axial image 25 series 10, suspicious for local progression of chordoma. Decreased size and conspicuity of the nodular enhancement along the ventral surface of the right pons seen on axial image 45 series 14. Unchanged central pattern of volume loss with frontal predominant cerebral white matter disease. ORBITS: No acute abnormality. SINUSES: Mild mucosal thickening in the sphenoid sinuses. BONES AND SOFT TISSUES: Mild mucosal thickening in the clivus resection cavity. IMPRESSION: 1. New 12 mm nodular enhancement along the lateral aspect of the right cavernous sinus with extension into the mesial right temporal lobe and surrounding T2 hyperintensity, suspicious for local progression of chordoma. 2. Decreased size and conspicuity of the nodular enhancement along the ventral surface of the right pons. 3. Unchanged central pattern of volume loss with frontal predominant cerebral white matter disease. Electronically signed by: Ryan Chess MD 04/30/2024 03:28 PM EDT RP Workstation: [id number]     Pathology: A-C. Skull mass centered in clivus, resection:   Chordoma.   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 Dr. Tommas has reviewed the case in consultation and concurs.   ** Patients and their health care providers are always welcome to call our office [phone]) to arrange to discuss the pathologic findings with me by phone or at the microscope (in person or virtually), or to email me at [email or web address]. As a surgical pathologist, a physician who diagnoses disease by microscopic examination of tissue, I am available as a member of the health care team to answer any questions you may have about the diagnosis.  **    Electronically signed by Pearline Arlean Mering, MD on 06/12/2022 at  3:56 PM    Assessment/Plan Chordoma of clivus [facility])  Manus LELON Lunger is clinically stable today, vertigo symptoms are/were peripheral in etiology.  MRI brain demonstrates improvement with regards to focus of enhancement within pons, c/w radionecrosis.  Unfortunately a novel region of enhancement is now apparent laterally within mesial right temporal lobe.  This again is indeterminate for radiation injury (favored) vs atypical recurrence of chordoma.     Recommended referral to CNS tumor board for more detailed discussion. For now, recommended continuing imaging surveillance, with repeat study in 2 months.  In the meantime he will also reach out to his teams at [employer] [facility] and [facility] for additional input.    We ask that SHERRI LEVENHAGEN return to clinic in 2 months following next brain MRI, or sooner as needed pending input from tertiary centers.  All questions were answered. The patient knows to call the clinic with any problems, questions or concerns. No barriers to learning were detected.  The total time spent in the encounter was 40 minutes and more than 50% was on counseling and review of test results   Arthea MARLA Manns, MD Medical Director of Neuro-Oncology [facility] [place] [place] 05/06/24 2:26 PM

## 2024-05-07 ENCOUNTER — Telehealth: Payer: Self-pay | Admitting: Internal Medicine

## 2024-05-07 DIAGNOSIS — M25661 Stiffness of right knee, not elsewhere classified: Secondary | ICD-10-CM | POA: Diagnosis not present

## 2024-05-07 NOTE — Telephone Encounter (Signed)
 Scheduled appointment per 8/14 los. Called and left a VM with appointment details for the patient.

## 2024-05-10 DIAGNOSIS — M25661 Stiffness of right knee, not elsewhere classified: Secondary | ICD-10-CM | POA: Diagnosis not present

## 2024-05-11 ENCOUNTER — Ambulatory Visit: Admitting: Internal Medicine

## 2024-05-12 ENCOUNTER — Encounter: Payer: Self-pay | Admitting: Internal Medicine

## 2024-05-18 DIAGNOSIS — M25661 Stiffness of right knee, not elsewhere classified: Secondary | ICD-10-CM | POA: Diagnosis not present

## 2024-05-21 DIAGNOSIS — C410 Malignant neoplasm of bones of skull and face: Secondary | ICD-10-CM | POA: Diagnosis not present

## 2024-06-08 ENCOUNTER — Encounter: Payer: Self-pay | Admitting: *Deleted

## 2024-07-02 DIAGNOSIS — C410 Malignant neoplasm of bones of skull and face: Secondary | ICD-10-CM | POA: Diagnosis not present

## 2024-07-05 ENCOUNTER — Inpatient Hospital Stay
Admission: RE | Admit: 2024-07-05 | Discharge: 2024-07-05 | Disposition: A | Payer: Self-pay | Source: Ambulatory Visit | Attending: Internal Medicine

## 2024-07-05 ENCOUNTER — Other Ambulatory Visit: Payer: Self-pay | Admitting: *Deleted

## 2024-07-05 DIAGNOSIS — C410 Malignant neoplasm of bones of skull and face: Secondary | ICD-10-CM

## 2024-07-05 NOTE — Progress Notes (Signed)
 Requested that [facility] send images of MRI Brain 07/02/2024 to [employer].  Requested that [facility] upload the images to outside films order.

## 2024-07-08 ENCOUNTER — Other Ambulatory Visit

## 2024-07-09 ENCOUNTER — Telehealth: Payer: Self-pay | Admitting: *Deleted

## 2024-07-09 NOTE — Telephone Encounter (Signed)
 PC to patient, informed him his appointment has been moved from 07/12/24 to Thursday, 07/15/24 at 12:30.  Instructed patient to contact us  if he needs to change this appointment.

## 2024-07-12 ENCOUNTER — Inpatient Hospital Stay: Admitting: Internal Medicine

## 2024-07-15 ENCOUNTER — Inpatient Hospital Stay: Attending: Internal Medicine | Admitting: Internal Medicine

## 2024-07-15 VITALS — BP 140/98 | HR 74 | Temp 97.7°F | Resp 18 | Ht 66.0 in | Wt 201.0 lb

## 2024-07-15 DIAGNOSIS — I6789 Other cerebrovascular disease: Secondary | ICD-10-CM | POA: Diagnosis not present

## 2024-07-15 DIAGNOSIS — Z923 Personal history of irradiation: Secondary | ICD-10-CM | POA: Diagnosis not present

## 2024-07-15 DIAGNOSIS — C412 Malignant neoplasm of vertebral column: Secondary | ICD-10-CM | POA: Diagnosis not present

## 2024-07-15 DIAGNOSIS — C410 Malignant neoplasm of bones of skull and face: Secondary | ICD-10-CM

## 2024-07-15 NOTE — Progress Notes (Signed)
 [facility] at [facility] 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 Deatrice at [facility]   09/24/2022 - 11/19/2022 Radiation Therapy   Proton beam RT at [facility], 70-70Gy    From [facility]: 10/2021: developed progressive double vision and horizontal diplopia 11/14/21: MRI brain/orbits with heterogeneous 2.0 cm cystic lesion involving the clivus with prepontine extension, abutment of the basilar artery, and mass effect on the adjacent pons. No pontine edema. 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 CTA head with fetal origin of bilateral PCA with diffusely diminutive/small caliber posterior circulation, clival osseous erosion with posterior projecting soft tissue mass. 05/14/22: MRI brain and total spine with stable appearance of clival enhancing mass without e/o drop metastases 06/06/22: undergoes endoscopic endonasal transclival tumor debulking with abdominal fat graft with Dr. Deatrice (NSGY) and Dr. Adil (OHNS) at [facility]. Some tumor adherent to the pons/basilar artery and could not be resected. No operative complications. Pathology c/w conventional chordoma, tumor cells positive for Brachyury, EMA, S100. BAF 47 retained. Ki-67 < 5% 06/07/22: MRI brain with postsurgical changes with debulking of clival mass with improved mass effect.  Small amount of surrounding mildly enhancing soft tissue anterior to the pons may reflect residual tumor. Radiation delayed due to insurance approval. 09/04/22: MRI brain with evolving surgical changes, residual prepontine soft tissue around the R lateral aspect of the basilar artery (0.8 x 0.6 cm), nodular enhancing soft tissue along the R clivus involving the region of Dorello's canal and along the posterior aspect of the R cavernous sinus unchanged, may also reflect residual tumor 09/10/22: Consult with Dr. Clotilda Snide ([facility]) for discussion of proton therapy, plan for 70-76 Gy to residual tumor 09/24/22: Starts RT   Biomarkers:  Interval History: Justin Hurst presents today for follow up after recent MRI brain.  No new or progressive changes reported today.  No recurrence of vertigo.  He otherwise denies headaches, seizures.    H+P (12/09/23) Patient presents today for follow up of recently treated chordoma.  Radiation was completed (protons) at [facility] 1 year ago.  He denies any neurologic symptoms aside from mild occasional dizziness.  No additional double vision, which had been his presenting symptom.  No headaches or seizures.  His main functional limitation at this time is from knee pain, he has upcoming knee replacement scheduled.    Medications: Current Outpatient Medications on File Prior to Visit  Medication Sig Dispense Refill   acetaminophen  (TYLENOL ) 500 MG tablet Take 2 tablets (1,000 mg total) by mouth every 6 (six) hours.     amoxicillin (AMOXIL) 500 MG capsule Take 2,000 mg by mouth See admin instructions. Take 4 capsules (2000 mg) by mouth 1 hour prior to dental appointments     carboxymethylcellulose (REFRESH PLUS) 0.5 % SOLN Place 1-2 drops into both eyes 3 (three) times daily as needed (dry/irritated eyes.).  celecoxib  (CELEBREX ) 200 MG capsule Take 1 capsule (200 mg total) by mouth daily. 30 capsule 0   chlorhexidine  (HIBICLENS ) 4 % external liquid  Apply 15 mLs (1 Application total) topically as directed for 30 doses. Use as directed daily for 5 days every other week for 6 weeks. 946 mL 1   Cholecalciferol (VITAMIN D3 PO) Take 1 tablet by mouth every evening.     methocarbamol  (ROBAXIN ) 500 MG tablet Take 1 tablet (500 mg total) by mouth every 6 (six) hours as needed for muscle spasms. 40 tablet 2   oxyCODONE  (OXY IR/ROXICODONE ) 5 MG immediate release tablet Take 1-2 tablets (5-10 mg total) by mouth every 4 (four) hours as needed for severe pain (pain score 7-10). 42 tablet 0   polyethylene glycol (MIRALAX  / GLYCOLAX ) 17 g packet Take 17 g by mouth 2 (two) times daily. 14 each 0   sertraline  (ZOLOFT ) 100 MG tablet Take 200 mg by mouth at bedtime.     tamsulosin  (FLOMAX ) 0.4 MG CAPS capsule Take 0.4 mg by mouth at bedtime.     No current facility-administered medications on file prior to visit.    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 Ernie Left Knee 03-09-18    KNEE ARTHROPLASTY     x2    KNEE ARTHROSCOPY     Left x3   ORIF FEMUR FRACTURE Left 03/09/2018   Procedure: Left knee femoral revision with open reduction internal fixation of distal femur periprosthetic fracture;  Surgeon: Ernie Cough, MD;  Location: [facility];  Service: Orthopedics;  Laterality: Left;  Adductor Block   ORIF FEMUR FRACTURE Left 06/26/2023   Procedure: OPEN REDUCTION INTERNAL FIXATION (ORIF) DISTAL FEMUR FRACTURE;  Surgeon: Celena Sharper, MD;  Location: [facility] OR;  Service: Orthopedics;  Laterality: Left;   SHOULDER ARTHROSCOPY     Left   TOTAL  KNEE ARTHROPLASTY Right 02/24/2024   Procedure: ARTHROPLASTY, KNEE, TOTAL;  Surgeon: Ernie Cough, MD;  Location: [facility];  Service: Orthopedics;  Laterality: Right;   TUMOR EXCISION  06/15/2022   brain   Social History:  Social History   Socioeconomic History   Marital status: Married    Spouse name: Not on file   Number of children: Not on file   Years of education: Not on file   Highest education level: Not on file  Occupational History   Not on file  Tobacco Use   Smoking status: Never   Smokeless tobacco: Never  Vaping Use   Vaping status: Never Used  Substance and Sexual Activity   Alcohol  use: Yes    Alcohol /week: 10.0 standard drinks of alcohol     Types: 10 Cans of beer per week    Comment: social   Drug use: Never   Sexual activity: Yes  Other Topics Concern   Not on file  Social History Narrative   Not on file   Social Drivers of Health   Financial  Resource Strain: Not on file  Food Insecurity: No Food Insecurity (02/24/2024)   Hunger Vital Sign    Worried About Running Out of Food in the Last Year: Never true    Ran Out of Food in the Last Year: Never true  Transportation Needs: No Transportation Needs (02/24/2024)   PRAPARE - Administrator, Civil Service (Medical): No    Lack of Transportation (Non-Medical): No  Physical Activity: Not on file  Stress: Not on file  Social Connections: Moderately Isolated (02/25/2024)   Social Connection and Isolation Panel    Frequency of Communication with Friends and Family: More than three times a week    Frequency of Social Gatherings with Friends and Family: More than three times a week    Attends Religious Services: Never    Database administrator or Organizations: No    Attends Banker Meetings: Never    Marital Status: Married  Catering manager Violence: Not At Risk (02/24/2024)   Humiliation, Afraid, Rape, and Kick questionnaire    Fear of Current or Ex-Partner: No    Emotionally Abused: No     Physically Abused: No    Sexually Abused: No   Family History:  Family History  Problem Relation Age of Onset   Cancer Mother    Arthritis Father    Heart disease Father    Vision loss Father    ADD / ADHD Daughter    Cancer Sister     Review of Systems: Constitutional: Doesn't report fevers, chills or abnormal weight loss Eyes: Doesn't report blurriness of vision Ears, nose, mouth, throat, and face: Doesn't report sore throat Respiratory: Doesn't report cough, dyspnea or wheezes Cardiovascular: Doesn't report palpitation, chest discomfort  Gastrointestinal:  Doesn't report nausea, constipation, diarrhea GU: Doesn't report incontinence Skin: Doesn't report skin rashes Neurological: Per HPI Musculoskeletal: Doesn't report joint pain Behavioral/Psych: Doesn't report anxiety  Physical Exam: Vitals:   07/15/24 1236  BP: (!) 140/98  Pulse: 74  Resp: 18  Temp: 97.7 F (36.5 C)  SpO2: 96%   KPS: 80. General: Alert, cooperative, pleasant, in no acute distress Head: Normal EENT: No conjunctival injection or scleral icterus.  Lungs: Resp effort normal Cardiac: Regular rate Abdomen: Non-distended abdomen Skin: No rashes cyanosis or petechiae. Extremities: No clubbing or edema  Neurologic Exam: Mental Status: Awake, alert, attentive to examiner. Oriented to self and environment. Language is fluent with intact comprehension.  Cranial Nerves: Visual acuity is grossly normal. Visual fields are full. Extra-ocular movements intact. No ptosis. Face is symmetric Motor: Tone and bulk are normal. Power is full in both arms and legs. Reflexes are symmetric, no pathologic reflexes present.  Sensory: Intact to light touch Gait: Orthopedic limitations   Labs: I have reviewed the data as listed    Component Value Date/Time   NA 137 02/25/2024 0329   NA 142 11/09/2021 0932   K 4.2 02/25/2024 0329   CL 104 02/25/2024 0329   CO2 21 (L) 02/25/2024 0329   GLUCOSE 110 (H)  02/25/2024 0329   BUN 24 (H) 02/25/2024 0329   BUN 16 11/09/2021 0932   CREATININE 1.05 02/25/2024 0329   CALCIUM 8.6 (L) 02/25/2024 0329   PROT 6.7 06/26/2023 0516   ALBUMIN  3.4 (L) 06/26/2023 0516   AST 24 06/26/2023 0516   ALT 27 06/26/2023 0516   ALKPHOS 91 06/26/2023 0516   BILITOT 0.8 06/26/2023 0516   GFRNONAA >60 02/25/2024 0329   GFRAA >60 03/10/2018 0551   Lab  Results  Component Value Date   WBC 11.5 (H) 02/25/2024   NEUTROABS 12.5 (H) 06/25/2023   HGB 11.8 (L) 02/25/2024   HCT 37.8 (L) 02/25/2024   MCV 100.3 (H) 02/25/2024   PLT 212 02/25/2024    Imaging: [facility] Clinician Interpretation: I have personally reviewed the CNS images as listed.  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 68 year old male with history of clival chordoma  status post endoscopic transsphenoidal approach for tumor debulking of  abdominal fat graft on 06/06/2022 and status post completion of radiotherapy  08/2022.   COMPARISON: MRI brain 11/29/2023   TECHNIQUE/PROTOCOL: Brain tumor protocol without and with IV contrast.   FINDINGS:  Brain Parenchyma:  Postsurgical changes of prior transsphenoidal clival  mass resection. Interval resolution of previously seen peripheral enhancing  lesion at the right pons. No rim enhancing lesion within the medial right  temporal lobe measuring 1.6 x 1.6 x 1.9 cm with new surrounding T2  hyperintensity within the anteromedial right temporal lobe. No hemorrhage,  acute cortical infarction, or midline shift.  No abnormal enhancement.  Confluent periventricular and subcortical T2/FLAIR hyperintensities are  nonspecific and most commonly related to chronic microvascular ischemic   disease.  Ventricles and Sulci: Normal for age.   Extra-Axial Spaces: No extra-axial fluid collection.  Basal Cisterns: Normal.  Intracranial Flow-Voids: Normal.   Paranasal Sinuses: Postsurgical changes. Mild polypoid thickening of the  left maxillary sinus.  Mastoids: Normal.  Orbits: Normal.  Cranium: Normal.  Visualized upper cervical spine: No high grade stenosis.   IMPRESSION:  New rim-enhancing lesion within the medial right temporal lobe with  surrounding vasogenic edema as compared to 11/29/2023. Findings are favored  to represent radiation necrosis versus less likely local extension of  residual disease. Short interval follow-up MRI with intravenous contrast  and ASL perfusion sequences is recommended for further evaluation.   Electronically Reviewed by:  Lonni Mayer, MD, [facility]  Electronically Reviewed on:  07/02/2024 3:28 PM     Assessment/Plan Chordoma of clivus [facility])  Radiation therapy induced brain necrosis  ARRON TETRAULT is clinically stable today.  MRI brain demonstrates worsening, expanding focus of enhancement within right temporal lobe.  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 The pontine lesion has resolved fully, consistent with radionecrosis.    Recommended continued imaging surveillance only at this time, with repeat MRI brain in 6-8 weeks.  He has a scan already scheduled for 12/23 at [facility].  We ask that TOMOKI LUCKEN return to clinic in 2 months following next brain MRI, or sooner as needed.  All questions were answered. The patient knows to call the clinic with any problems, questions or concerns. No barriers to learning were detected.  The total time spent in the encounter was 40 minutes and more than 50% was on counseling and review of test results   Arthea MARLA Manns, MD Medical Director of Neuro-Oncology [facility] at [place] [facility] 07/15/24 12:30 PM

## 2024-07-19 ENCOUNTER — Telehealth: Payer: Self-pay | Admitting: Internal Medicine

## 2024-07-19 NOTE — Telephone Encounter (Signed)
 Scheduled patient for next appointment. Called and spoke with the patient, he is aware.

## 2024-08-25 ENCOUNTER — Telehealth: Payer: Self-pay | Admitting: *Deleted

## 2024-08-25 NOTE — Telephone Encounter (Signed)
 Patient called to report that over the past 1-2 weeks he has started having sudden dizzy spells that result in falling to his right side.  In the past 5 days it has happened daily.  Today patient took Meclizine and so far it appears to have helped.  Also reports that he stopped taking Wellbutrin last week but reports wasn't taking consistently.  Is due to have next MRI 09/14/2024 with 6 days prior dosing steroids for radiation necrosis.  Wife concerned and wanted to report and find out if they should be evaluated prior to next MRI.

## 2024-09-01 NOTE — Telephone Encounter (Signed)
 Response message from provider received on 09/01/2024 from 08/25/2024.  Called patient to see how he has been doing since he was not communicated the instructions to restart the steroid from Dr Eward office.  He said that he was told by [facility] to restart and he had but was experiencing worse side effects (slurred speech/ gait issues).  He is going to [facility] on 09/03/2024 for evaluation and hopefully MRI.  I advised him that I would let Dr Buckley know of the update and asked that he give us  a call back once he has seen [facility].
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
## 2024-09-07 NOTE — Progress Notes (Signed)
 "  Occupational Therapy Occupational Therapy Initial Evaluation  Patient Name:  Justin Hurst Date of Evaluation: 09/07/24 Time of Evaluation:  1159 Duration of Session:  29 Minutes Room/Bed: C41/C41  Precautions: Falls Risk    Reason for Admission: Justin Hurst is a 68 y.o. 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 Deatrice 06/06/2022) and 76 gy of 38 fractions (12/23) c/b radiation necrosis who presents to the ED for mobility issues.  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  Assessment: Patient presents for OT evaluation at significant regression from functional baseline of independent with ADLs. In the days leading up to admission, he has needed help from his wife for any kind of movement, and has had multiple falls over the last few months due to ataxia. Today needed hands on assistance x2 for bed mobility, and was able to stand briefly at edge of bed with a standard walker and moderate assistance x2, limited by retropulsion and B LE weakness. He needed significant physical assistance for right lean without UE support.   The patient will continue to benefit from the skills of an occupational therapist to address Impaired motor control, Decreased strength, Decreased ROM/flexibility, Decreased Basic ADLs/Self-care, Decreased IADLs, Decreased endurance/activity intolerance, Decreased Balance, Decreased functional mobility.   Post-discharge, he would benefit from acute rehab to maximize functional independence with ADLs and IADLs, as he has a high prior level of function and good support.   Recommendations for performance of self care with nursing:  Use BMAT score and associated clinical judgement to  determine safe mobility on a daily basis as patient status may be subject to change. Bed level ADLs.   Discharge Recommendations: Is the patient safe to discharge to the recommended disposition? Yes Discharge Recommendations: Acute rehab  Justin Hurst  is a great acute rehab candidate due to age, previously independent, has ADL goals, has IADL goals, and good family/friend support  Complete details of today's session: Patient cleared for therapy with RN and seen for co-treat with PT to safely progress mobility and for activity tolerance. See assessment and flowsheet for details.   At end of session, the patient was left semi-reclined in bed, with family present, with all needs in reach, with nurse call device in reach. His status was communicated to the RN, PT, Patient.      09/07/24 1159  Discipline Timestamp  Discipline Timestamp OT  Documentation Type  Documentation Type                                E,R, T   Initial Assessment  Patient Subjective Information  Patient Subjective Information Patient agreeable to therapy  Occupational Profile/Social History  Social history source                                Patient;Partner (Wife)  Patient lives                           with partner  Admitted from Home  Number of falls in the last 3 months 10 (Losing balance, dizzy, not picking feet up. Pt with history of vertigo)  Basic ADLs Independent in  all areas  Instrumental ADLs requires Assistance With: Requires assistance in all areas       Assistive device for household mobility Lobbyist type [employer] device for community mobility Facilities Manager type [employer] type single point  Home Activity / Exercise No  Assistance received prior to admission Yes  Receives help from Family (Wife helping with any kind of movement)  Number of days per week 7  No.of hours per day 24  Assistance available after discharge Same as prior to admission  Home  Environment  Type of Home House  Home Layout Multi-level (bedroom / bathroom with grab bars upstairs)  Access Exterior stairs       Exterior stairs - number of steps 6 (From front door, primary way pt enters / exits house)       Exterior stairs - rails Both sides (Wide)  Bathroom Shower/Tub Walk-in shower  Bathroom Toilet Raised  Home Equipment/DME Available For Use  Bathroom Equipment Grab bars in shower;Shower Ambulance Person accessible Child Psychotherapist wheelchair fits)  Theatre Stage Manager type Management Consultant type Single point       Wheelchair type Manual  ADL Equipment reacher  Bed Other (Comment) (Flat bed, has lift recliner if needed)  Insurance Account Manager Goals  Patient/Family Goals Return to previous lifestyle  Precautions  Precautions Falls Risk  Vital Signs  Heart Rate 63  Heart Rate Source Monitor  Pulse 63  Resp 17  BP (!) 143/76  MAP (mmHg) 96  BP Location Right upper arm  BP Method Automatic  Patient Position Lying  Pain Assessment  Pain Assessment %% 0-10  Pain Score %% Three  Pain Type Acute pain;Chronic pain  Pain Loc GENERALIZED  Pain Descriptors Patient unable to describe  Pain Frequency Constant/continuous  Pain Onset On-going  Non-Pharmacological Intervention(s) Active listening;Rest  Pain Intervention Response %% Patient satisfied with pain management  Multiple Pain Sites No  Oxygen Therapy  SpO2 93 %  Any supplemental oxygen? No  Cognition     Client Factors/Performance Skills  Overall Cognitive Status WNL (Pt's cognition generally WNL, however per chart review wife has noted increased confusion, intermittently answering questions with differing information from wife)  Arousal/Alertness Alert  Attention Span Appears intact  Following Commands Follows multi-step commands  Behavior Cooperative  Safety Judgment Patient at risk for falls  Communication WNL  Visual                             Client Factors/Performance Skills  Additional Comments Needs further assessment. Per EMR, patient has recent diplopia.  Neuro-Musculoskeletal    Client Factors/Performance Skills    RUE Assessment WFL  LUE Assessment WFL  Additional Comments Decreased functional strength grossly 4/5, WFL for ADLs  Coordination  Coordination Dysdiadochokinesia (impaired rapid alternating movements);Finger to nose;Resting tremor  Resting Tremor Location  (not fully assessed; full B UE and B LE tremors during standing)  Finger to Nose Location RUE Intact  Dysdiadochokinesia Location Impaired  Fine Motor  (Pt reports impairment)  Additional Comments Patient reports difficulty with brushing teeth and opening packages d/t fine motor deficits. In standing, patient was  Adult OT Outcomes  Highest Level of Function Yes  Highest Level of  ADLs 0 - None  ADL Activity Code 5 - In standing  Inpatient AM-PAC Performed Daily Activity Inpatient Short Form  AM-PAC 6 Clicks Daily Activity Inpatient Short Form  Putting on and taking off regular lower body clothing? 1-Total  Bathing (including washing, rinsing, drying)? 2-A Lot  Toileting, which includes using toilet, bedpan, or urinal? 2-A Lot  Putting on and taking off regular upper body clothing? 2-A Lot  Taking care of personal grooming such as brushing teeth? 3-A Little  Eating meals? 3-A Little  AM-PAC Daily Activity Raw Score 13  AM-PAC Daily Activity t-Scale Score 32.03  Daily Activity G-Code Modifier CL  Balance  Sitting/Standing Balance  Sitting Static;Standing Static       Sitting Balance Surface Other (Specify) (ED stretcher)       Static Sitting Support Needed Bilateral UE;Feet supported       Static Sitting Balance Assist Required Minimum assistance;Maximum assistance (Fluctuated between min2 and max1)       Static Standing-Balance Support Bilateral upper extremity support       Static Standing Balance Assist Required Moderate  assistance (x2)  Standing Assistive Devices Walker       Walker type Standard  Mobility  Bed Mobility  Supine to Sit;Sit to Supine;Sit to Stand;Stand to Sit       Supine to Sit Assistance Contact Guard Assist (Hand Touch)       Supine to Sit Details Requires extra time;Set up;Verbal cues;Head of bed elevated       Number of People Required 1       Sit to Supine Assistance Contact Guard Assist (Hand Touch)       Sit to Supine Details Requires extra time;Set up;Verbal cues;Head of bed elevated       Number of People Required 1       Sit to Stand Assistance Minimal assist (pt. performs > 75%)       Sit to Stand Details Requires extra time;Set up;Verbal cues       Number of People Required 2       Sit to Stand Assistive Devices Walker       Walker type Standard       Stand to Sit Assistance Minimal assist (pt. performs > 75%)       Stand to Sit Details Requires extra time;Set up;Verbal cues       Number of People Required 2       Stand to Sit Assistive Devices Walker       Walker type Standard  Ambulation No  Basic ADLs            Analysis of Occupational Performance  Overall ADL Status Maximal Assist (Pt. performs 25-49%);Dependent (Total Assist)  Patient Status At End of Session  Status Communicated to: RN;PT;Patient  Pt Left: semi-reclined in bed;with family present;with all needs in reach;with nurse call device in reach  Assessment  OT Enhancers Previous level of function/active lifestyle;Age;Good Upper Extremity Strength;Motivated;Good family support/resources  OT Barriers History of falls;Low activity tolerance;Decreased activity tolerance/medically complex/comorbities;Caregiver unable to meet patient needs  Impairments  Impaired motor control;Decreased strength;Decreased ROM/flexibility;Decreased Basic ADLs/Self-care;Decreased IADLs;Decreased endurance/activity intolerance;Decreased Balance;Decreased functional mobility  Rehab Potential  Good  Pt.safe for Discharge/OT perspective?  Yes  Summary of Findings  The patient demonstrates decreased function secondary to  Patient Demonstrates Decreased Function Secondary to Medical status limitations;Decreased activity tolerance  Plan  Treatment/Interventions Basic self care;Bed mobility;Transfers;Functional mobility;Therapeutic exercise;Assistive Devices for ADL;WC management/mobility;Patient and family education;Neuromuscular Reeducation  OT Frequency 4x  per week  Discharge Recommendation (DUH/DRH) Acute rehab  DME Recommendations Per accepting facility  Plan (Progress Note) Establish Plan of Care     OT GOALS     * OT GOALS (Active)     * Occupational Therapy Endurance     Dates: Start:  09/07/24      Disciplines: OT    * Endurance     Dates: Start:  09/07/24    Expected End:  09/21/24      Description: STG=LTG: Patient will tolerate out of bed for 2 minute(s) standing with the least restrictive assistive device to increase overall endurance for ADLs within 2 weeks.    Disciplines: OT        * Occupational Therapy: Transfers     Dates: Start:  09/07/24      Disciplines: PT, OT    * Toileting     Dates: Start:  09/07/24    Expected End:  09/21/24      Description: STG=LTG: Patient will complete toileting and perineal hygiene using no assistive device with minimal assistance within 2 weeks.     Disciplines: PT, OT      * Commode Transfer     Dates: Start:  09/07/24    Expected End:  09/21/24      Description: STG=LTG: Patient will perform a functional transfer from a bed to a bedside commode beside bed using the least restrictive assistive device with minimal assistance within 2 weeks.    Disciplines: PT, OT          * OT GOALS (Resolved)     There are no resolved problems.             Please see OT overview for patient education completed today.    JULIA PULAWSKI, OT     *Some images could not be shown."

## 2024-09-07 NOTE — Initial Assessments (Signed)
 "   Physical Therapy Initial Evaluation  Patient Name:  Justin Hurst Date of Evaluation: 09/07/24 Time of Evaluation:  1200 Duration of Session:  26 Minutes Room/Bed: C41/C41  Precautions: Falls Risk   Assessment: Pt presented to initial evaluation with regression in functional mobility from previously ambulatory baseline, secondary to admission for reasons as detailed below. Pt stated multiple falls at home prior to admission, secondary to new onset of diplopia and ataxic tremors leading to gait difficulties. Today, he required assistance from 2 providers throughout and tolerated briefly standing at EOB with use of standard walker. Standing tolerance limited by trunk retropulsion and BLE weakness present at this time. Of note, pt required significant assistance to maintain static sitting and standing due to difficulty with stability and balance, in the setting of tremors with mobility. Due to this, further out of bed activities including ambulation was deferred at this time.   In order to safely return home the pt will be required to progress towards tolerating household distance ambulation, and negotiating steps with a unilateral rail. Due to multiple recent falls and his current functional status, he is unsafe to d/c home. However, he would be an excellent candidate to d/c to acute rehab secondary to young age, strong motivation to progress towards PLOF and high level of functional mobility goals.  The patient will continue to benefit from the skills of a physical therapist to address Impaired motor control, Decreased balance, Impaired functional mobility, Decreased strength, Decreased endurance/activity tolerance, Decreased ROM/flexibility, Gait/Ambulation limitations, Pain.  Limiting factors to progression may include pain, decreased balance, tremors with mobility.    Recommendations for mobility with nursing:  Use BMAT score and associated clinical judgement to determine safe mobility on a  daily basis as patient status may be subject to change.  Pt unsafe for out of bed transfers at this time. Please defer to bed mobility necessary for use of bed pan with nursing x1-2.   Discharge Recommendations: Is the patient safe to discharge to the recommended disposition? Yes (To acute rehab) Discharge Recommendations: Acute rehab  Mr. Hardenbrook  is a great acute rehab candidate due to age, has high level functional mobility goals, good friend/family support, and highly motivated  Complete details of today's session: Chart reviewed and spoke with RN prior to session to clear pt for therapy. Co-treat with OT for maximum safety and therapeutic benefit. Pt received semi-reclined in bed with wife present, agreeable to participate in evaluation. He completed all functional mobility as detailed in flow sheet below. At end of session, the patient was left semi-reclined in bed, with family present, with all needs in reach, with nurse call device in reach. His status was communicated to the RN, OT, Patient.   Reason for Admission: [name] is a 68 y.o. 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 Pt had an MRI done this am and was called by his oncologist and told to come to the ED d/t the steroids not decreasing the swelling in the brain. Pt endorsing confusion, vision changes, headaches, dysphagia and gait difficulties. Pt denies any fevers at home.  He started taking half a tablet of steroids last Monday or Tuesday, and despite an increased dosage on Friday, his symptoms have not improved. He confirms  taking his steroids this morning, dexamethasone  2mg .   He experiences a significant increase in weakness, describing it as putting him in 'a lot of danger.' He has tightness in his  throat, difficulty with speech, and issues with his eyes, particularly difficulty keeping one eye open and experiencing diplopia. Dizziness and a vertigo-like sensation are also present. The symptoms have progressively worsened over the past week. He has experienced multiple falls due to weakness, resulting in a scratch on his leg from a fall yesterday morning.  No fevers, chills, chest pain, or significant trouble breathing.    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  Past Surgical History:  Procedure Laterality Date   arthrodesis of ankle Right 2007   FUSION FOOT SUBTALAR Left 12/25/2016   Procedure: (RCC) ARTHRODESIS; SUBTALAR;  Surgeon: Oneil FORBES Bill, MD;  Location: [facility];  Service: Orthopedics;  Laterality: Left;   FUSION ARTHRODESIS ANKLE W/ARTHROSCOPY Left 12/25/2016   Procedure: ARTHRODESIS, ANKLE, OPEN;  Surgeon: Oneil FORBES Bill, MD;  Location: [facility];  Service: Orthopedics;  Laterality: Left;   MICROSURGERY N/A 06/06/2022   Procedure: MICROSURGERY;  Surgeon: Deatrice Belvie Agent, MD;  Location: [facility];  Service: Neurosurgery;  Laterality: N/A;   INTRAOPERATIVE FLUOROSCOPY N/A 06/06/2022   Procedure: INTRAOPERATIVE FLOUROSCOPY;  Surgeon: Deatrice Belvie Agent, MD;  Location: [facility];  Service: Neurosurgery;  Laterality: N/A;   UNLISTED PROCEDURE NERVOUS SYSTEM N/A 06/06/2022   Procedure: UNLISTED PROCEDURE, NERVOUS SYSTEM;  Surgeon: Deatrice Belvie Agent, MD;  Location: [facility];  Service: Neurosurgery;  Laterality: N/A;   EXCISION PARASELLAR AREA LESION N/A 06/06/2022   Procedure: RESECTION OR EXCISION OF NEOPLASTIC, VASCULAR OR INFECTIOUS LESION OF PARASELLAR AREA, CAVERNOUS SINUS, CLIVUS OR MIDLINE SKULL BASE; INTRADURAL, INCLUDING  DURAL REPAIR, WITH OR WITHOUT GRAFT;  Surgeon: Deatrice Belvie Agent, MD;  Location: [facility];  Service: Neurosurgery;  Laterality: N/A;   REPAIR DURA SECONDARY FOR CSF LEAK POST SURGERY W/FREE TISSUE GRAFT N/A 06/06/2022   Procedure: REPAIR DURA SECONDARY FOR CSF LEAK POST SURGERY W/FREE TISSUE GRAFT;  Surgeon: Terresa Alm Jude, MD;  Location: [facility];  Service: Otolaryngology Head and Neck;  Laterality: N/A;   UNLISTED PROCEDURE ACCESSORY SINUSES N/A 06/06/2022   Procedure: UNLISTED PROCEDURE, ACCESSORY SINUSES;  Surgeon: Terresa Alm Jude, MD;  Location: [facility];  Service: Otolaryngology Head and Neck;  Laterality: N/A;   EXCISION PARASELLAR AREA LESION N/A 06/06/2022   Procedure: RESECTION OR EXCISION OF NEOPLASTIC, VASCULAR OR INFECTIOUS LESION OF PARASELLAR AREA, CAVERNOUS SINUS, CLIVUS OR MIDLINE SKULL BASE; INTRADURAL, INCLUDING DURAL REPAIR, WITH OR WITHOUT GRAFT;  Surgeon: Terresa Alm Jude, MD;  Location: [facility];  Service: Otolaryngology Head and Neck;  Laterality: N/A;   TRANSPETROSAL APPROACH W/LIGATION SUPERIOR SINUS N/A 06/06/2022   Procedure: TRANSPETROSAL APPROACH TO POSTERIOR CRANIAL FOSSA, CLIVUS OR FORAMEN MAGNUM, INCLUDING LIGATION OF SUPERIOR PETROSAL SINUS AND/OR SIGMOID SINUS;  Surgeon: Terresa Alm Jude, MD;  Location: [facility];  Service: Otolaryngology Head and Neck;  Laterality: N/A;   NEUROVASCULAR PEDICLE FLAP Right 06/06/2022   Procedure: FLAP; NEUROVASCULAR PEDICLE;  Surgeon: Terresa Alm Jude, MD;  Location: [facility];  Service: Otolaryngology Head and Neck;  Laterality: Right;   ARTHROSCOPIC ROTATOR CUFF REPAIR Left    arthroscopy of knee Left    JOINT REPLACEMENT Left    x 2 knee  09/07/24 1200  Discipline Timestamp  Discipline Timestamp PT  Documentation Type     Documentation Type                                E,R, T   Initial Assessment  Patient Subjective Information  Patient  Subjective Information Patient agreeable to therapy  Patient Profile/Social History  Social history source                                Patient;Partner (Wife)  Patient lives                           with partner  Admitted from Home  Number of falls in the last 3 months 10 (Losing balance, dizzy, not picking feet up. Pt with history of vertigo)  Prior Level of Function Household mobility;Community mobility       Assistive device for household mobility Lobbyist type [employer] device for community mobility Facilities Manager type [employer] type single point  Home Activity / Exercise No  Assistance received prior to admission Yes  Receives help from Family (Wife helping with any kind of movement)  Number of days per week 7  No.of hours per day 24  Assistance available after discharge Same as prior to admission  Home Environment  Type of Home House  Home Layout Multi-level (bedroom / bathroom with grab bars upstairs)  Access Exterior stairs       Exterior stairs - number of steps 6 (From front door, primary way pt enters / exits house)       Exterior stairs - rails Both sides (Wide)  Bathroom Shower/Tub Walk-in shower  Bathroom Toilet Raised  Comments Pt has full bedroom in basement with 3 STE with no rails  Home Equipment/DME Available For Use  Bathroom Equipment Grab bars in shower;Shower Ambulance Person accessible Child Psychotherapist wheelchair fits)  Theatre Stage Manager type Management Consultant type Single point       Wheelchair type Manual  ADL Equipment reacher  Bed Other (Comment) (Flat bed, has lift recliner if needed)  Lift equipment Lift chair  Precautions  Precautions Falls Risk  Patient/Family Goals  Patient/Family Goals Return to previous lifestyle  Vital Signs  Heart Rate 63  Heart Rate Source Monitor  Pulse 63  Resp 17  BP (!) 143/76  MAP (mmHg) 96  BP Location Right  upper arm  BP Method Automatic  Patient Position Lying  Pain Assessment  Pain Assessment %% 0-10  Pain Score %% Three  Pain Type Acute pain;Chronic pain  Pain Loc GENERALIZED  Pain Descriptors Patient unable to describe  Pain Frequency Constant/continuous  Pain Onset On-going  Non-Pharmacological Intervention(s) Active listening;Rest;Repositioned  Pain Intervention Response %% Patient satisfied with pain management  Multiple Pain Sites No  Change in Therapy? (Inpatient / HOD Only) No  Activity At Time Of Vitals Measurement  Activity Rest;Physical Exertion  Review of Systems Cardiovascular/Pulmonary Function  SpO2 93 %  Any supplemental oxygen? No  Review of Systems - Cognition  Overall Cognitive Status WNL (Pt's cognition generally WNL, however per chart review wife has noted increased confusion, intermittently answering questions with differing information from wife)  Arousal/Alertness Alert;Appropriate responses to stimuli  Attention Span Appears intact  Following Commands Follows multi-step commands;Consistently  Behavior Cooperative;Motivated  Safety Judgment Patient at risk for falls  Memory Intact  Communication WNL  Review of Systems- Musculoskeletal  RLE Assessment WFL  LLE Assessment WFL  Balance  Sitting/Standing Balance  Sitting Static;Standing Static       Sitting Balance Surface Other (Specify) (ED stretcher)       Static Sitting Support Needed Bilateral UE;Feet supported       Static Sitting Balance Assist Required Minimum assistance;Maximum assistance (Fluctuated between min2 and max1)       Static Standing-Balance Support Bilateral upper extremity support       Static Standing Balance Assist Required Moderate assistance (x2)  Standing Assistive Devices Walker       Walker type Standard  Additional Comments Pt requiring significant assistance to maintain static sitting and standing due to onset of ataxic tremors with mobility, greatly  impacting balance. Only tolerated standing for a brief period prior to significant retropulsion and sitting back on EOB  Mobility  Bed Mobility  Supine to Sit;Sit to Supine;Sit to Stand;Stand to Sit       Supine to Sit Assistance Contact Guard Assist (Hand Touch)       Supine to Sit Details Requires extra time;Set up;Verbal cues;Head of bed elevated       Number of People Required 1       Sit to Supine Assistance Contact Guard Assist (Hand Touch)       Sit to Supine Details Requires extra time;Set up;Verbal cues;Head of bed elevated       Number of People Required 1       Sit to Stand Assistance Minimal assist (pt. performs > 75%)       Sit to Stand Details Requires extra time;Set up;Verbal cues       Number of People Required 2       Sit to Stand Assistive Devices Walker       Walker type Standard       Stand to Sit Assistance Minimal assist (pt. performs > 75%)       Stand to Sit Details Requires extra time;Set up;Verbal cues       Number of People Required 2       Stand to Sit Assistive Devices Walker       Walker type Standard  Ambulation No  Stairs/Curb assessed No  Adult PT Outcomes  Highest Level of Function Yes  Highest Level of Activity 5- Standing  Inpatient AM-PAC Performed Basic Mobility Inpatient Short Form - 6 Clicks  AM-PAC 6 Clicks Basic Mobility Inpatient Short Form  Turning from your back to your side while in a flat bed without using bedrails? 3-A Little  Moving from lying on your back to sitting on the side of a flat bed without using bedrails? 3-A Little  Moving to and from a bed to a chair (including a wheelchair)? 3-A Little  Standing up from a chair using your arms (e.g,. wheelchair, or bedside chair)? 3-A Little  To walk in hospital room? 2-A Lot  Climbing 3-5 steps with a railing? 1-Total  AM-PAC Basic Mobility Raw Score 15  AM-PAC Basic Mobility t-Scale Score 36.97  AM-PAC Basic Mobility G-Code Modifier CK  Patient Status At End of Session  Status  Communicated  to: RN;OT;Patient  Pt Left: semi-reclined in bed;with family present;with all needs in reach;with nurse call device in reach  Assessment  PT Enhancers Good family support/resources;Motivated;Rehab experience;Has needed equipment;Good Upper Extremity Strength;Age  PT Barriers Pain;History of falls;Home environmental barriers;Low activity tolerance;Decreased activity tolerance/medically complex/comorbidities  Impairments/Functional Limitations    Impaired motor control;Decreased balance;Impaired functional mobility;Decreased strength;Decreased endurance/activity tolerance;Decreased ROM/flexibility;Gait/Ambulation limitations;Pain  Rehab Potential   Good  Patient safe for DC/PT perspective? Yes (To acute rehab)  Summary of Findings  The patient demonstrates decreased function secondary to  Patient Demonstrates Decreased Function Secondary to Medical status limitations;Decreased activity tolerance;Pain  Plan  Treatment/Interventions Bed mobility;Transfers;Therapeutic exercise;Stair training;Gait training;Mobility training;Endurance training  PT Frequency 5x per week  Discharge Recommendation (DUH/DRH) Acute rehab  Acute Rehab Justifications Patient demonstrates need for acute, intensive combined therapies, 3 hrs/day each day.;The patient has potential to return to an independent level of function;The patient has potential to return to community ambulation;The patient has goals for high level balance and functional mobility;The patient has family support  DME Recommendations Per accepting facility  Plan(Progress Note) Establish Plan of Care    PT Goals     * PT Goals (Active)     * Physical Therapy: Locomotion     Dates: Start:  09/07/24      Disciplines: PT    * PT Ambulation     Dates: Start:  09/07/24    Expected End:  09/21/24      Priority: High   Description: STG=LTG: Patient will ambulate up to 25 feet on a hard level surface using a rolling walker with contact guard  assistance within 2 weeks.    Disciplines: PT        * Physical Therapy: Transfers     Dates: Start:  09/07/24      Disciplines: PT, OT    * Transfer supine to sit and back     Dates: Start:  09/07/24    Expected End:  09/21/24      Priority: High   Description: STG=LTG: Patient will transfer from supine to sit and back from a flat bed using no assistive device with stand-by assistance within 2 weeks.   Disciplines: PT, OT      * Sit/Stand Transfer     Dates: Start:  09/07/24    Expected End:  09/21/24      Priority: High   Description: STG=LTG: Patient will come to standing from  a standard chair height (18-20 inches) using an assistive device with contact guard assistance within 2 weeks.    Disciplines: PT          * PT Goals (Resolved)     There are no resolved problems.             Please see patient education record for PT teaching completed today.   SCHUYLER FOLEY, PT [phone]  *Some images could not be shown."

## 2024-09-07 NOTE — Care Plan (Signed)
" ° °  Speech Language Pathology Contact Note   Location: Inpatient/ C41  Time of attempt/s: 8:47 am  Verification from Source Document: 1.Patient's name 2.DOB 3.Correct Procedure  Consult received for speech language cognitive evaluation and treatment. Patient currently in the emergency department. Will await transfer to room to complete cognitive testing for optimal testing environment.   Thank you for this referral.  Please page [phone] with questions.   Alan Monte Rummage, MS, [facility]- SLP   "

## 2024-09-08 NOTE — Progress Notes (Signed)
 " Physical Therapy Progress Note  Patient Name:  Justin Hurst Date of Therapy Session: 09/08/24 Time of Therapy Session:  1016 Duration of Session:  12 Minutes Room/Bed: 4311/4311-01  Precautions: Falls Risk, Altered Mental Status   Assessment: Pt required increased level of assist today, requiring min-mod assist +2 for bed mobility and contact guard up to max assist for static sitting balance. Pt most limited by decreased activity tolerance, pt limited by dizziness w/ upright sitting.   Pt continues to present at regression from ambulatory w/ RW or SPC at baseline and with Impaired motor control, Decreased balance, Impaired functional mobility, Decreased strength, Decreased endurance/activity tolerance, Decreased ROM/flexibility, Gait/Ambulation limitations, Pain, Impaired cognition, Decreased transfers, Decreased bed mobility. Pt will continue to benefit from skilled PT during acute hospitalization to address these impairments and to maximize functional independence.   Recommendations for mobility with nursing: dependent, hoyer/maximove lift  (Use BMAT score and associated clinical judgement to determine safe mobility on a daily basis as patient status may be subject to change.)  Discharge Recommendations: Is the patient safe to discharge to the recommended disposition? Yes Discharge Recommendations: Acute rehab  Pt  is a great acute rehab candidate due to has high level functional mobility goals, good friend/family support, highly motivated, and previous level of function.   Complete details of today's session: Per RN, no concerns regarding pt participation in therapy. Co-treat performed w/ OT in order to maximize patient safety and functional mobility. Pt performed functional mobility as detailed below, pt reported dizziness at EOB that did not improve. Assisted pt back to supine and BP as detailed below. At end of session, the patient was left semi-reclined in bed (in care of OT). His  status was communicated to the Patient, OT.     09/08/24 1016  Discipline Timestamp  Discipline Timestamp PT  Documentation Type     Documentation Type                                E,R, T   Treatment  Patient Subjective Information  Patient Subjective Information Patient agreeable to therapy  Precautions  Precautions Falls Risk;Altered Mental Status  Patient/Family Goals  Patient/Family Goals Return to previous lifestyle  Vital Signs  Heart Rate 70  BP (!) 144/106  BP Location Left upper arm  BP Method Automatic  Patient Position Lying  Pain Assessment  Pain Assessment %% 0-10  Pain Score %% Zero  Review of Systems Cardiovascular/Pulmonary Function  SpO2 95 %  Any supplemental oxygen? No  Review of Systems - Cognition                         Overall Cognitive Status Impaired/altered mental status  Arousal/Alertness Alert  Orientation To person;To situation;To family  Following Commands Follows one step commands;Consistently  Behavior Cooperative;Motivated  Safety Judgment Patient at risk for falls  Communication WNL  Balance  Sitting/Standing Balance  Sitting Static       Sitting Balance Surface Hospital Bed       Static Sitting Support Needed Bilateral UE;Feet supported       Static Sitting Balance Assist Required Maximum assistance (CGA regressing to MAX for R lean)  Mobility  Bed Mobility  Supine to Sit;Sit to Supine;Scooting       Scooting Assistance Maximal assist (pt. performs 25-49%) (at EOB, towards [facility])       Scooting Details Requires extra time;Set up;Verbal cues;Right;Drawsheet /  pad       Number of People Required 2       Supine to Sit Assistance Moderate assist (pt. performs 50-75%)       Supine to Sit Details Requires extra time;Set up;Verbal cues;Head of bed elevated       Number of People Required 2       Sit to Supine Assistance Minimal assist (pt. performs > 75%)       Sit to Supine Details Requires extra time;Set up;Verbal cues;Head of bed flat        Number of People Required 2  Adult PT Outcomes  Highest Level of Function Yes  Highest Level of Activity 4- Sitting edge of bed  Inpatient AM-PAC Performed Basic Mobility Inpatient Short Form - 6 Clicks  AM-PAC 6 Clicks Basic Mobility Inpatient Short Form  Turning from your back to your side while in a flat bed without using bedrails? 2-A Lot  Moving from lying on your back to sitting on the side of a flat bed without using bedrails? 2-A Lot  Moving to and from a bed to a chair (including a wheelchair)? 2-A Lot  Standing up from a chair using using your arms (e.g,. wheelchair, or bedside chair)? 2-A Lot  To walk in hospital room? 1-Total  Climbing 3-5 steps with a railing? 1-Total  AM-PAC Basic Mobility Raw Score 10  AM-PAC Basic Mobility t-Scale Score 28.13  AM-PAC Basic Mobility G-Code Modifier CL  Patient Status At End of Session  Status Communicated to: Patient;OT  Pt Left: semi-reclined in bed (in care of OT)  Assessment  PT Enhancers Good family support/resources;Motivated;Rehab experience;Good Upper Extremity Strength;Age  PT Barriers Pain;History of falls;Home environmental barriers;Low activity tolerance;Decreased activity tolerance/medically complex/comorbidities;Impaired cognition  Impairments/Functional Limitations    Impaired motor control;Decreased balance;Impaired functional mobility;Decreased strength;Decreased endurance/activity tolerance;Decreased ROM/flexibility;Gait/Ambulation limitations;Pain;Impaired cognition;Decreased transfers;Decreased bed mobility  Rehab Potential   Good  Patient safe for DC/PT perspective? Yes  Summary of Findings  The patient demonstrates decreased function secondary to;Tolerated treatment well;Patient is progressing slowly toward written goals secondary to  Patient is Progressing Slowly Toward Written Goals Secondary to Medical status limitations;Decreased activity tolerance  Patient Demonstrates Decreased Function Secondary to  Medical status limitations;Decreased activity tolerance;Pain;Cognitive deficits  Plan  Treatment/Interventions Bed mobility;Transfers;Therapeutic exercise;Stair training;Gait training;Mobility training;Endurance training;WC management/mobility;Patient and family education;Neuromuscular reeducation  PT Frequency 5x per week  Discharge Recommendation (DUH/DRH) Acute rehab  Acute Rehab Justifications Patient demonstrates need for acute, intensive combined therapies, 3 hrs/day each day.;Physical therapy services are indicated twice a day, each day.;The patient has goals for high level balance and functional mobility;The patient has family support  DME Recommendations Per accepting facility  Plan(Progress Note) Continue current plan of care    Please see patient education record for PT teaching completed today.   AMY TALLY, PT    "

## 2024-09-13 NOTE — Care Plan (Signed)
" °  Problem: Risk for Injury related to restraint use Goal: Remains free from restraints Outcome: Progressing Goal: Patient will remain free from injury while in restraints Outcome: Progressing   Problem: All patients positive for delirium Goal: Patient will remain safe Outcome: Progressing   Problem: Disorientation Goal: The patient will be oriented to person, time, or place in the environment Outcome: Progressing   Problem: Inappropriate Behavior Goal: The patient will have behavior appropriate to place and/or for the person e.g. pulling at tubes or dressings, attempting to get out of bed when that is contraindicated and the like Outcome: Progressing   Problem: Inappropriate Communication Goal: The patient will have communication appropriate to place and/or for the person e.g. incoherence, non-communicative, nonsensical, or unintelligible speech Outcome: Progressing   Problem: Illusions/Hallucinations Goal: The patient will no longer see or hear things that are not there, distortion of visual objects Outcome: Progressing   Problem: Psychomotor Delay Goal: The patient will no longer have delayed responsiveness, few or spontaneous actions/word e.g. when patient is prodded, reaction is deferred and/or the patient is unarousable Outcome: Progressing   Problem: Cerebral insult: Includes TBI, TIA, CVA, SAH, Brain lesion/ tumor, ICH: Goal: Neurologic status will improve. Outcome: Progressing   Problem: Activity: Goal: Activity level will increase. Outcome: Progressing   "

## 2024-09-13 NOTE — Progress Notes (Signed)
 " Physical Therapy Progress Note  Patient Name:  Justin Hurst Date of Therapy Session: 09/13/24 Time of Therapy Session:  1112 Duration of Session:  40 Minutes Room/Bed: 8A10/8A10-01  Precautions: Falls Risk, Altered Mental Status   Assessment: Patient presents to acute PT session with chief complaint of gait difficulty. This patient currently requires 1 person assist for bed mobility and 2 person assistance for OOB mobility. This is a regression from their premorbid baseline of independence resulting in functional limitations in midline orientation, postural control, motor control , and coordination.They continue to demonstrate steady  progress as evidenced by improved awareness and initiation of postural correction with visual feedback provided by the mirror. He was also able to participate in standing balance activities with bilateral support--starting with minAx2 and increasing to modA 2/2 fatigue and R lateral lean.  The patient will continue to benefit from the skills of a physical therapist to address Impaired motor control, Decreased balance, Impaired functional mobility, Decreased endurance/activity tolerance, Gait/Ambulation limitations, Sensory deficit. Recommending acute PT to follow while pt remains in-house, with d/c to acute rehab to best facilitate neuroplasticity improvement with daily, intensive, and skilled therapies.     Recommendations for mobility with nursing: dependent lift  Use BMAT score and associated clinical judgement to determine safe mobility on a daily basis as patient status may be subject to change.    Discharge Recommendations: Is the patient safe to discharge to the recommended disposition? Yes Discharge Recommendations: Acute rehab  [name]  is a great acute rehab candidate due to age, previously independent , has high level functional mobility goals, good friend/family support, and highly motivated  Complete details of today's session: Patient has been chart  reviewed and cleared for PT by RN. Patient presents semi-reclined and agreeable to PT/OT co-treatment to optimize pt mobility progression . Patient participated in bed mobility, transfers,and postural stability activities as detailed below. At end of session, the patient was left seated in recliner at bedside, lower extremities elevated in recliner, with family present, with all needs in reach, with nurse call device in reach, with bed/chair alarm system engaged. His status was communicated to the Patient, Family, RN, OT.     09/13/24 1112  Discipline Timestamp  Discipline Timestamp PT  Documentation Type     Documentation Type                                E,R, T   Treatment  Patient Subjective Information  Patient Subjective Information Patient agreeable to therapy  Precautions  Precautions Falls Risk;Altered Mental Status  Patient/Family Goals  Patient/Family Goals Return to previous lifestyle  Vital Signs  BP 109/76  BP Location Right upper arm  BP Method Automatic  Patient Position Sitting  Pain Assessment  Pain Assessment %% 0-10  Pain Score %% Zero  Review of Systems - Cognition                         Arousal/Alertness Alert;Appropriate responses to stimuli  Orientation To person;To time;To place;To family  Attention Span Appears intact  Following Commands Delayed Response to commands;Follows one step commands;Consistently;Verbal cues;Visual cues;Tactile cues  Behavior Cooperative;Motivated  Safety Judgment Good awareness of safety precautions;Patient at risk for falls;Family present  Communication WNL  Balance       Sitting Balance Surface Hospital Bed       Static Sitting Support Needed Bilateral UE;Feet supported  Static Sitting Balance Assist Required Moderate assistance;Minimum assistance       Dynamic Sitting Support Needed Feet supported;Bilateral UE       Dynamic Sitting Balance Assist Required Minimum assistance;Moderate assistance       Dynamic Sitting  Activities Reaching across midline;Reaches outside base of support;Reaches within base of support;Trunk control activities;Lateral lean;Forward lean+ seated marches   Mobility  Bed Mobility  Supine to Sit;Sit to Supine       Scooting Assistance Maximal assist (pt. performs 25-49%)       Scooting Details Requires extra time;Set up;Verbal cues;Right;Drawsheet / pad       Number of People Required 2       Supine to Sit Assistance Contact Guard Assist (Hand Touch)       Supine to Sit Details Uses rails;Requires extra time;Verbal cues;Head of bed elevated;Right       Number of People Required 1       Sit to Supine Assistance Minimal assist (pt. performs > 75%)       Sit to Supine Details Requires extra time;Set up;Verbal cues;Head of bed flat       Number of People Required 1       Sit to Stand Assistance Moderate assist (pt. performs 50-75%)       Sit to Stand Details Requires extra time;Set up;Verbal cues       Number of People Required 2       Sit to Stand Assistive Devices Other (Comment) (bilat HHA)       Stand to Sit Assistance Minimal assist (pt. performs > 75%)       Stand to Sit Details Requires extra time;Set up;Verbal cues       Number of People Required 2       Transfers Scooting transfer       Scooting Transfer Assistance Moderate assist (pt. performs 50-75%)       Scooting Transfer Details Requires extra time;Set up;Verbal cues;from;hospital bed;to;recliner       Number of people required 2  Ambulation No- pt attempting standing marches with multimodal cues, however unable to achieve adequate weight shift for foot clearance   PT Treatment Summary  PT Treatment Summary Pt participated in static/dynamic sitting with visual feedback via mirror, with 5# weight for proprioceptive input, there was decreased ataxic actifivty of RLE. 2STS with minA inc to modA for standing balance before transferrig to recliner. Able to perform additional STS, however unable to fully weight shift in either  directio for march in place 2/2 weakness and LE ataxia RLE>LLE  Adult PT Outcomes  Highest Level of Activity 5- Standing  Time in Highest Level of Activity 1-5 min  Adverse Events 0- None  Inpatient AM-PAC Performed Basic Mobility Inpatient Short Form - 6 Clicks  AM-PAC 6 Clicks Basic Mobility Inpatient Short Form  Turning from your back to your side while in a flat bed without using bedrails? 3-A Little  Moving from lying on your back to sitting on the side of a flat bed without using bedrails? 3-A Little  Moving to and from a bed to a chair (including a wheelchair)? 2-A Lot  Standing up from a chair using using your arms (e.g,. wheelchair, or bedside chair)? 2-A Lot  To walk in hospital room? 1-Total  Climbing 3-5 steps with a railing? 1-Total  AM-PAC Basic Mobility Raw Score 12  AM-PAC Basic Mobility t-Scale Score 32.23  AM-PAC Basic Mobility G-Code Modifier CL  Patient Status At End of Session  Status Communicated to: Patient;Family;RN;OT  Pt Left: seated in recliner at bedside;lower extremities elevated in recliner;with family present;with all needs in reach;with nurse call device in reach;with bed/chair alarm system engaged  Assessment  PT Enhancers Good family support/resources;Motivated;Rehab experience;Good Upper Extremity Strength;Age;Previous level of function/active lifestyle  PT Barriers History of falls;Home environmental barriers;Low activity tolerance;Decreased activity tolerance/medically complex/comorbidities;Impaired cognition  Impairments/Functional Limitations    Impaired motor control;Decreased balance;Impaired functional mobility;Decreased endurance/activity tolerance;Gait/Ambulation limitations;Sensory deficit  Rehab Potential   Good  Patient safe for DC/PT perspective? Yes  Summary of Findings  Tolerated treatment well;Will benefit from therapies at the acute rehabilitation level  Patient Demonstrates Decreased Function Secondary to Medical status  limitations;Decreased activity tolerance;Pain  Plan  Treatment/Interventions Bed mobility;Transfers;Therapeutic exercise;Stair training;Gait training;Mobility training;Endurance training;WC management/mobility;Patient and family education;Neuromuscular reeducation  PT Frequency 5x per week  Discharge Recommendation ([facility]) Acute rehab  Acute Rehab Justifications Patient demonstrates need for acute, intensive combined therapies, 3 hrs/day each day.;Physical therapy services are indicated twice a day, each day.;The patient has goals for high level balance and functional mobility;The patient has family support  DME Recommendations Per accepting facility  Plan(Progress Note) Continue current plan of care      Please see patient education record for PT teaching completed today.   CIPRYANA MACK, PT     "

## 2024-09-13 NOTE — Progress Notes (Signed)
 " NeuroOncology Inpatient Consultation  Time of Service: 09/13/2024, 8:12 PM Consultation for: radiation necrosis Consulting Team: Neurology  Identifying Statement: Justin Hurst  is a 68 y.o. 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 PMH is complicated by right dislocated shoulder injury, bilateral TKN, bilateral ankle fusions, and significant OA.   Interval History:  Patient states that he is doing well today. He denies any new headache or deterioration in neurologic symptoms. His only concern is the continued clumsiness of his right upper extremity since the stroke. He is looking forward to go to rehab. His wife and him had good questions about the rehab. From a brain tumor standpoint, they also understand that Avastin will be on hold for at least six months after the stroke.   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   Neuro-degenerative disorders ()    Preoperative evaluation to rule out surgical contraindication 05/15/2022    Past Surgical History: Past Surgical History:  Procedure Laterality Date   arthrodesis of ankle Right 2007   FUSION FOOT SUBTALAR Left 12/25/2016   Procedure: (RCC) ARTHRODESIS; SUBTALAR;  Surgeon: Oneil FORBES Bill, MD;  [facility]: [facility];  Service: Orthopedics;  Laterality: Left;   FUSION ARTHRODESIS ANKLE W/ARTHROSCOPY Left 12/25/2016   Procedure: ARTHRODESIS, ANKLE, OPEN;  Surgeon: Oneil FORBES Bill, MD;  Location: [facility];  Service: Orthopedics;  Laterality: Left;   MICROSURGERY N/A 06/06/2022   Procedure: MICROSURGERY;  Surgeon: Deatrice Belvie Agent, MD;  Location: [facility];  Service: Neurosurgery;  Laterality:  N/A;   INTRAOPERATIVE FLUOROSCOPY N/A 06/06/2022   Procedure: INTRAOPERATIVE FLOUROSCOPY;  Surgeon: Deatrice Belvie Agent, MD;  Location: [facility];  Service: Neurosurgery;  Laterality: N/A;   UNLISTED PROCEDURE NERVOUS SYSTEM N/A 06/06/2022   Procedure: UNLISTED PROCEDURE, NERVOUS SYSTEM;  Surgeon: Deatrice Belvie Agent, MD;  Location: [facility];  Service: Neurosurgery;  Laterality: N/A;   EXCISION PARASELLAR AREA LESION N/A 06/06/2022   Procedure: RESECTION OR EXCISION OF NEOPLASTIC, VASCULAR OR INFECTIOUS LESION OF PARASELLAR AREA, CAVERNOUS SINUS, CLIVUS OR MIDLINE SKULL BASE; INTRADURAL, INCLUDING DURAL REPAIR, WITH OR WITHOUT GRAFT;  Surgeon: Deatrice Belvie Agent, MD;  Location: [facility];  Service: Neurosurgery;  Laterality: N/A;   REPAIR DURA SECONDARY FOR CSF LEAK POST SURGERY W/FREE TISSUE GRAFT N/A 06/06/2022   Procedure: REPAIR DURA SECONDARY FOR CSF LEAK POST SURGERY W/FREE TISSUE GRAFT;  Surgeon: Terresa Alm Jude, MD;  Location: [facility];  Service: Otolaryngology Head and Neck;  Laterality: N/A;   UNLISTED PROCEDURE ACCESSORY SINUSES N/A 06/06/2022   Procedure: UNLISTED PROCEDURE, ACCESSORY SINUSES;  Surgeon: Terresa Alm Jude, MD;  Location: [facility];  Service: Otolaryngology Head and Neck;  Laterality: N/A;   EXCISION PARASELLAR AREA LESION N/A 06/06/2022   Procedure: RESECTION OR EXCISION OF NEOPLASTIC, VASCULAR OR INFECTIOUS LESION OF PARASELLAR AREA, CAVERNOUS SINUS, CLIVUS OR MIDLINE SKULL BASE; INTRADURAL, INCLUDING DURAL REPAIR, WITH OR WITHOUT GRAFT;  Surgeon: Terresa Alm Jude, MD;  Location: [facility];  Service: Otolaryngology Head and Neck;  Laterality: N/A;   TRANSPETROSAL APPROACH W/LIGATION SUPERIOR SINUS N/A 06/06/2022   Procedure: TRANSPETROSAL APPROACH TO POSTERIOR CRANIAL FOSSA, CLIVUS OR FORAMEN MAGNUM, INCLUDING LIGATION OF SUPERIOR  PETROSAL SINUS AND/OR SIGMOID SINUS;  Surgeon: Terresa Alm Jude, MD;   Location: [facility];  Service: Otolaryngology Head and Neck;  Laterality: N/A;   NEUROVASCULAR PEDICLE FLAP Right 06/06/2022   Procedure: FLAP; NEUROVASCULAR PEDICLE;  Surgeon: Terresa Alm Jude, MD;  Location: [facility];  Service: Otolaryngology Head and Neck;  Laterality: Right;   ARTHROSCOPIC ROTATOR CUFF REPAIR Left    arthroscopy of knee Left    JOINT REPLACEMENT Left    x 2 knee     Family History: Family History  Problem Relation Name Age of Onset   Anesthesia problems Neg Hx      Social History: Social History   Socioeconomic History   Marital status: Married   Number of children: 1  Occupational History   Occupation: Retired  Tobacco Use   Smoking status: Never   Smokeless tobacco: Never  Vaping Use   Vaping status: Never Used  Substance and Sexual Activity   Alcohol  use: Yes    Comment: socially drinks beer   Drug use: No   Sexual activity: Yes    Partners: Female   Social Drivers of Corporate Investment Banker Strain: Low Risk  (09/09/2024)   Overall Financial Resource Strain (CARDIA)    Difficulty of Paying Living Expenses: Not hard at all  Food Insecurity: No Food Insecurity (09/09/2024)   Hunger Vital Sign    Worried About Running Out of Food in the Last Year: Never true    Ran Out of Food in the Last Year: Never true  Transportation Needs: No Transportation Needs (09/09/2024)   PRAPARE - Administrator, Civil Service (Medical): No    Lack of Transportation (Non-Medical): No     Allergies: No Known Allergies  Medications: Prior to Admission Medications  Prescriptions Last Dose Taking?  ALPRAZolam  (XANAX ) 1 MG tablet Not Taking No  Sig: TK 1 Tablet nightly  Patient not taking: Reported on 09/06/2024  QUEtiapine (SEROQUEL) 25 MG tablet 09/05/2024 Yes  Sig: Take 1 tablet (25 mg total) by mouth at bedtime for 120 days  acetaminophen  (TYLENOL ) 325 MG tablet Past Month Yes  Sig: Take 3 tablets (975  mg total) by mouth every 6 (six) hours as needed for Pain for up to 120 doses  amoxicillin (AMOXIL) 500 MG capsule Not Taking No  Sig: Take 2,000 mg by mouth  Patient not taking: Reported on 09/06/2024  buPROPion (WELLBUTRIN) 75 MG tablet Not Taking No  Sig: Take 75 mg by mouth once daily  Patient not taking: Reported on 09/06/2024  carboxymethylcellulose (REFRESH PLUS) 0.5 % ophthalmic solution Not Taking No  Sig: 1-2 drops as needed for dry / irritated eyes Ophthalmic three times a day As needed  Patient not taking: Reported on 09/06/2024  cetirizine (ZYRTEC) 10 MG tablet Not Taking No  Sig: Take 10 mg by mouth once daily  Patient not taking: Reported on 09/06/2024  cholecalciferol, vitamin D3, (VITAMIN D3 ORAL) Not Taking No  Sig: Take 1 tablet by mouth once daily  Patient not taking: Reported on 09/06/2024  dexAMETHasone  (DECADRON ) 1 MG tablet 09/06/2024 Yes  Sig: Take 2 tablets (2 mg total) by mouth daily with breakfast for 30 days  diclofenac (VOLTAREN) 75 MG EC tablet 09/05/2024 Yes  Sig: Take 75 mg by mouth 2 (two) times daily with meals  ibuprofen (MOTRIN) 200 MG tablet Not Taking No  Sig: 1 tablet with food or milk as needed Orally Three times a day  Patient not taking: Reported  on 09/06/2024  oxyCODONE  (ROXICODONE ) 5 MG immediate release tablet Not Taking No  Sig: Take 5-10 mg by mouth  Patient not taking: Reported on 09/06/2024  sertraline  (ZOLOFT ) 100 MG tablet 09/05/2024 Yes  Sig: Take 100 mg by mouth once daily    Facility-Administered Medications: None    Physical Exam: BP (!) 144/92 (BP Location: Right upper arm, Patient Position: Lying)   Pulse 76   Temp 37.3 C (99.2 F) (Oral)   Resp 18   Ht 170.2 cm (5' 7)   Wt 92.6 kg (204 lb 1.6 oz)   SpO2 95%   BMI 31.97 kg/m  Body mass index is 31.97 kg/m. Body surface area is 2.09 meters squared. KPS: 70 General: This is a well-developed, well-nourished male in no acute distress Head: Normocephalic, atraumatic,  symmetrical and without deformities EENT: No conjunctival injection or scleral icterus. No septal deviation or perforation. Oral mucosa moist without lesions.  Lungs: Clear to auscultation bilaterally. Cardiac: Regular rate and rhythm, no murmur or rub Skin:  Bruises and scabs from prior falls.  Extremities: No clubbing, cyanosis, edema, or varicosities noted  Neurologic Exam Mental Status: Patient oriented to person, place and time. Aware of current events. Affect is appropriate.  Speech: Fluent with normal language production and no dysarthria.    Cranial Nerves Vision (II):  Visual acuity is grossly normal. VFF are full.  EOMs (III,IV,VI):  Gaze and tracking appear normal with smooth pursuits, no saccades. No ptosis.  Pupils(III):  Pupils are equal, round, and reactive to light. Normal accommodation.  Face (V, VII):  Facial sensation is intact. Muscles of mastication are normal.  Hearing (VIII):  Hearing is grossly normal.  Gag/Swallow (IX):  Gag reflex appears normal. Voice with hoarseness on exam.  Palate (X):  Elevates symmetrically.  Neck Strength (XI):  Sternocleidomastoid and trapezius appear normal.  Tongue (XII): Midline without fasciculations. Motor: Strength symmetric 5/5 right upper and right lower extremity and 5/5 left upper and left lower extremity. Normal muscle mass and tone in all extremities and no pronator drift  Sensory: Intact to light touch. No extinction.  Coordination: FTN ataxia with right hand Romberg: did not assess Gait: did not assess Data:  Labs in chart were reviewed.   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  FINDINGS: Brain Parenchyma: Compared to 07/02/2024, interval increase in size of peripherally enhancing lesion centered in the right medial temporal lobe currently measuring 1.9 x 1.7 cm transaxial, previously 1.2 x 1.5 cm when remeasured for consistency. Mild interval increase in surrounding nonenhancing subcortical T2/FLAIR signal abnormality throughout the right anterior temporal lobe deep white matter. No associated ASL hyperperfusion within limitations of a moderately artifact  degraded scan. New small focus of diffusion restriction and T2/FLAIR hyperintensity in the right lateral medulla (series 4 image 8).  Severe confluent deep and periventricular white matter T2/FLAIR hyperintensities, nonspecific but commonly associated with chronic microvascular ischemic change.  Ventricles and Sulci: Mildly prominent consistent with global cerebral volume loss. No evidence of obstructive hydrocephalus. Extra-Axial Spaces: No extra-axial fluid collection. Basal Cisterns: Normal. Intracranial Flow-Voids: Major intracranial flow voids are preserved. Paranasal Sinuses: Mild polypoid mucosal thickening along the inferior maxillary sinuses bilaterally. Postsurgical changes of prior transsphenoidal resection. Mastoids: Normal. Orbits: Normal. Cranium: Stable postsurgical and posttreatment appearance of the clivus. No new suspicious enhancement. Visualized upper cervical spine: Multilevel degenerative changes with at least mild spinal canal stenosis at C3-C4 on the basis of anterolisthesis and disc osteophyte complex. IMPRESSION: 1.  Compared to 07/02/2024, increased size of irregular peripheral enhancement centered in the right medial  temporal lobe with increased surrounding vasogenic edema. No associated ASL hyperperfusion. Overall findings remain most consistent with evolving radiation necrosis. 2.  Stable postsurgical and posttreatment changes in the clivus without evidence of tumor progression. 3.  New small focus of diffusion restriction in the right lateral medulla consistent with recent infarction. 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 Findings were communicated to Dr. Josette Micki Beady at 10:00 AM on 09/07/2024. Electronically Signed by:  Artist Barefoot, MD, [facility] Electronically Signed on:  09/07/2024 10:04 AM   Impression/Recommendations:  Justin Hurst is a 68 y.o. 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   # Clival chordoma s/p STR 2023 and proton RT 2024 Given the radiographic and clinical evidence suggesting worsening radiation necrosis, this warrants dedicated treatment with a definitive course of high dose steroids. 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 He has only been challenged on decadron  2mg  daily for a couple of days leading up to his ED presentation. Prior intolerance to decadron  may have coincided with his stroke symptoms. Patient is tolerating current decadron  burst and taper with improvement in headaches and right VF symptoms even on tapered doses. For these reasons, we recommend:  - Decadron  2mg  daily since 12/21. Patient to remain on that dose until he followup with Dr. Buckley - Avastin on hold for six months given stroke treatment while inpatient - Please ensure patient is taking PPI while on decadron  - I have connected with his  outpatient neuro-oncologist Dr. Buckley to arrange outpatient follow up when he is nearing discharge, likely 4 weeks from d/c date since patient will be going to acute rehab   # Subacute/chronic lateral medullary stroke  - patient now on high dose statin and low dose aspirin  - cleared by Speech for regular diet - remaining stroke evaluation and risk reduction per Neurology service  DISPO plan: likely d/c to acute rehab    I have personally completed the above consult note, including the noted history and exam, as well as personal review and interpretation of the laboratory and diagnostic studies as documented in the medical record. I have reviewed the prior records available through our electronic medical record. This consult required approximately 55 minutes on the unit with the patient, with more than 50% of the time spent counseling and coordinating care.   ANNICK  DESJARDINS 09/13/2024 8:12 PM  "

## 2024-09-13 NOTE — Progress Notes (Signed)
 "   Occupational Therapy Progress Note  Patient Name:  Justin Hurst Date of Therapy Session: 09/13/24 Time of Therapy Session:  1133 Duration of Session:  30 Minutes Room/Bed: 8A10/8A10-01  Precautions: Falls Risk, Altered Mental Status    Assessment:  Pt is making steady progress towards OT goals with improving sitting balance and transfer training. Focus of today's session was on using visual feedback to correct sitting posture and transfer training in preparation for commode transfers. He demonstrated improvements with sitting with use of the mirror, requiring CGA-min A and verbal cues. He required max A for first scoot during transfer but then progressed to min A. Pt will continue to benefit from the skills of an occupational therapist to address impairments in Impaired motor control, Decreased strength, Decreased ROM/flexibility, Decreased Basic ADLs/Self-care, Decreased IADLs, Decreased endurance/activity intolerance, Decreased Balance, Decreased functional mobility. Limiting factors to progression may include medical status limitations. Pt will need daily, intensive, multidisciplinary therapies at the AR/IRF level to maximize functional independence and optimize their recovery during this period of optimal neuroplasticity.   Recommendations: Is the patient safe to discharge to the recommended disposition? Yes Discharge Recommendations: Acute rehab  Justin Hurst is a great acute rehab candidate due to age, previously independent, has ADL goals, has IADL goals, and good family/friend support  Toileting Recommendations: Patient is safe to be placed in bed in chair position 2-3 times per day and dependently transfer to the recliner via hoyer lift with nursing staff  Please encourage patient to engage in hygiene after toileting.  Strategies for increasing daily activity engagement: Sit up in recliner or bed in chair position 2-3x/day - coordinate with mealtimes if able  Encourage patient to  complete morning and bedtime grooming routine and to assist in bathing and dressing as able Mobilize -Use BMAT score and associated clinical judgment to determine safe mobility on a daily basis as patient status may be subject to change. Turn lights on/open window shades during the day Complete arm and leg exercises  Complete details of today's session: RN agreeable to session. Pt received semi-reclined in bed upon arrival and willing to participate in therapy. Dovetailed session with PT for safety with transfer training on this date.   Patient participated in bed mobility, sitting balance training, sit to stand trials, and scooting transfer. Engaged in laundry folding task while in standing (mod-max A for standing balance). While in sitting, he worked on reaching across midline to write on the mirror. See details outlined below in flowsheet.  At end of session, the patient was left seated in recliner at bedside, lower extremities elevated in recliner, with family present, with all needs in reach, with nurse call device in reach, with bed/chair alarm system engaged. His status was communicated to the Patient, Family, RN. See flowsheet for details.     09/13/24 1133  Discipline Timestamp  Discipline Timestamp OT  Documentation Type  Documentation Type                                E,R, T   Treatment  Patient Subjective Information  Patient Subjective Information Patient agreeable to therapy  Patient/Family Goals  Patient/Family Goals Return to previous lifestyle  Precautions  Precautions Falls Risk;Altered Mental Status  Pain Assessment  Pain Assessment %% 0-10  Pain Score %% Zero  Cognition     Client Factors/Performance Skills  Arousal/Alertness Alert  Orientation X 4  Attention Span Attends with cues to redirect  Following Commands Follows one step commands;Consistently  Behavior Cooperative;Motivated  Safety Judgment Patient at risk for falls;Decreased awareness of  deficits;Decreased awareness of need for safety;Decreased awareness of need for assistance;Chair alarm in use  Insight Intact  Initiation Intact  Communication WNL  Neuro-Musculoskeletal    Client Factors/Performance Skills    RUE Assessment WFL  LUE Assessment WFL  Adult OT Outcomes  Highest Level of Function Yes  Highest Level of ADLs 7 - Bathing/dressing lower body  ADL Activity Code 4 - At edge of bed  Inpatient AM-PAC Performed Daily Activity Inpatient Short Form  AM-PAC 6 Clicks Daily Activity Inpatient Short Form  Putting on and taking off regular lower body clothing? 2-A Lot  Bathing (including washing, rinsing, drying)? 2-A Lot  Toileting, which includes using toilet, bedpan, or urinal? 1-Total  Putting on and taking off regular upper body clothing? 2-A Lot  Taking care of personal grooming such as brushing teeth? 3-A Little  Eating meals? 3-A Little  AM-PAC Daily Activity Raw Score 13  AM-PAC Daily Activity t-Scale Score 32.03  Daily Activity G-Code Modifier CL  Mobility  Additional Comments min A sup>sit, min-CGA for sitting balance with use of mirror for visual feedback, min A transfer to recliner, CGA-min A to initially stand but mod A for standing balance due to R sided lean  Basic ADLs            Analysis of Occupational Performance  Overall ADL Status Maximal Assist (Pt. performs 25-49%)  Additional Comments Focus of today's session spent on transfer training in preparation for commode transfers and sitting balance. Pt reported completing grooming tasks earlier this morning and was encouraged to continue engaging in daily routines with nursing staff.  Patient Status At End of Session  Status Communicated to: Patient;Family;RN  Pt Left: seated in recliner at bedside;lower extremities elevated in recliner;with family present;with all needs in reach;with nurse call device in reach;with bed/chair alarm system engaged  Assessment  Impairments  Impaired motor  control;Decreased strength;Decreased ROM/flexibility;Decreased Basic ADLs/Self-care;Decreased IADLs;Decreased endurance/activity intolerance;Decreased Balance;Decreased functional mobility  Rehab Potential  Good  Pt.safe for Discharge/OT perspective? Yes  Summary of Findings  Tolerated treatment well;Will benefit from therapies at the acute rehabilitation level  Patient is Progressing Slowly Toward Written Goals Secondary to Medical status limitations;Decreased activity tolerance  Patient Demonstrates Decreased Function Secondary to Medical status limitations;Decreased activity tolerance;Pain  Plan  Treatment/Interventions Basic self care;Bed mobility;Transfers;Functional mobility;Therapeutic exercise;Assistive Devices for ADL;WC management/mobility;Patient and family education;Neuromuscular Reeducation  OT Frequency 4x per week  Discharge Recommendation (DUH/DRH) Acute rehab  Acute Rehab Justifications Acute rehab;Patient demonstrates need for acute, intensive combined therapies, 3 hrs/day each day.;The patient has potential to return to an independent level of function;The patient has family support;The patient has goals for IADLs (Cooking, cleaning, and laundry);The patient was previously employed and will need an advanced IADL program.;The patient has goals for basic ADLs (bathing and dressing)  DME Recommendations Per accepting facility  Plan (Progress Note) Continue OT per written plan of care      Please see patient education record for OT teaching completed today.  Lacinda Glatter, OTD, OTR/L, CNS, CBIS Pager [phone]  "

## 2024-09-14 ENCOUNTER — Encounter: Payer: Self-pay | Admitting: Physical Therapy

## 2024-09-14 ENCOUNTER — Encounter: Payer: Self-pay | Admitting: *Deleted

## 2024-09-14 ENCOUNTER — Inpatient Hospital Stay
Admission: RE | Admit: 2024-09-14 | Discharge: 2024-09-14 | Disposition: A | Discharge status: Home / Self Care | Payer: Self-pay | Source: Ambulatory Visit | Attending: Internal Medicine | Admitting: Internal Medicine

## 2024-09-14 ENCOUNTER — Other Ambulatory Visit: Payer: Self-pay | Admitting: *Deleted

## 2024-09-14 DIAGNOSIS — C410 Malignant neoplasm of bones of skull and face: Secondary | ICD-10-CM

## 2024-09-14 NOTE — Progress Notes (Signed)
 Requested images from MRI brain at [facility] on 09/06/24 be pushed to [facility].  Email to [employer] to link images to outside films order.

## 2024-09-16 NOTE — Progress Notes (Signed)
 " NeuroOncology Inpatient Consultation  Time of Service: 09/16/2024, 1:18 PM Consultation for: radiation necrosis Consulting Team: Neurology  Identifying Statement: Justin Hurst  is a 68 y.o. 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 PMH is complicated by right dislocated shoulder injury, bilateral TKN, bilateral ankle fusions, and significant OA.   Interval History:  Patient and wife met today.  He states that he is doing very well today. He plans to take a walk with his wife after lunch.  He denies any new headache. He is still pleased with the improvement of his right upper extremity clumsiness.  He is looking forward to go to rehab.   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   Neuro-degenerative disorders ()    Preoperative evaluation to rule out surgical contraindication 05/15/2022    Past Surgical History: Past Surgical History:  Procedure Laterality Date   arthrodesis of ankle Right 2007   FUSION FOOT SUBTALAR Left 12/25/2016   Procedure: (RCC) ARTHRODESIS; SUBTALAR;  Surgeon: Oneil FORBES Bill, MD;  Location: [facility];  Service: Orthopedics;  Laterality: Left;   FUSION ARTHRODESIS ANKLE W/ARTHROSCOPY Left 12/25/2016   Procedure: ARTHRODESIS, ANKLE, OPEN;  Surgeon: Oneil FORBES Bill, MD;  Location: [facility] OR;  Service: Orthopedics;  Laterality: Left;   MICROSURGERY N/A 06/06/2022   Procedure: MICROSURGERY;  Surgeon: Deatrice Belvie Agent, MD;  Location: [facility];  Service: Neurosurgery;  Laterality: N/A;   INTRAOPERATIVE FLUOROSCOPY N/A 06/06/2022   Procedure: INTRAOPERATIVE FLOUROSCOPY;  Surgeon: Deatrice Belvie Agent, MD;  Location: [facility]ATING [facility];  Service: Neurosurgery;  Laterality: N/A;   UNLISTED PROCEDURE NERVOUS SYSTEM N/A 06/06/2022   Procedure: UNLISTED PROCEDURE, NERVOUS SYSTEM;  Surgeon: Deatrice Belvie Agent, MD;  Location: [facility];  Service: Neurosurgery;  Laterality: N/A;   EXCISION PARASELLAR AREA LESION N/A 06/06/2022   Procedure: RESECTION OR EXCISION OF NEOPLASTIC, VASCULAR OR INFECTIOUS LESION OF PARASELLAR AREA, CAVERNOUS SINUS, CLIVUS OR MIDLINE SKULL BASE; INTRADURAL, INCLUDING DURAL REPAIR, WITH OR WITHOUT GRAFT;  Surgeon: Deatrice Belvie Agent, MD;  Location: [facility];  Service: Neurosurgery;  Laterality: N/A;   REPAIR DURA SECONDARY FOR CSF LEAK POST SURGERY W/FREE TISSUE GRAFT N/A 06/06/2022   Procedure: REPAIR DURA SECONDARY FOR CSF LEAK POST SURGERY W/FREE TISSUE GRAFT;  Surgeon: Terresa Alm Jude, MD;  Location: [facility];  Service: Otolaryngology Head and Neck;  Laterality: N/A;   UNLISTED PROCEDURE ACCESSORY SINUSES N/A 06/06/2022   Procedure: UNLISTED PROCEDURE, ACCESSORY SINUSES;  Surgeon: Terresa Alm Jude, MD;  Location: [facility];  Service: Otolaryngology Head and Neck;  Laterality: N/A;   EXCISION PARASELLAR AREA LESION N/A 06/06/2022   Procedure: RESECTION OR EXCISION OF NEOPLASTIC, VASCULAR OR INFECTIOUS LESION OF PARASELLAR AREA, CAVERNOUS SINUS, CLIVUS OR MIDLINE SKULL BASE; INTRADURAL, INCLUDING DURAL REPAIR, WITH OR WITHOUT GRAFT;  Surgeon: Terresa Alm Jude, MD;  Location: [facility];  Service: Otolaryngology Head and Neck;  Laterality: N/A;   TRANSPETROSAL APPROACH W/LIGATION SUPERIOR SINUS N/A 06/06/2022   Procedure: TRANSPETROSAL APPROACH TO POSTERIOR CRANIAL FOSSA, CLIVUS OR FORAMEN MAGNUM, INCLUDING LIGATION OF SUPERIOR PETROSAL SINUS AND/OR SIGMOID SINUS;  Surgeon: Terresa Alm Jude, MD;  Location: [facility];  Service: Otolaryngology  Head and Neck;  Laterality: N/A;   NEUROVASCULAR PEDICLE FLAP Right 06/06/2022    Procedure: FLAP; NEUROVASCULAR PEDICLE;  Surgeon: Terresa Alm Jude, MD;  Location: [facility];  Service: Otolaryngology Head and Neck;  Laterality: Right;   ARTHROSCOPIC ROTATOR CUFF REPAIR Left    arthroscopy of knee Left    JOINT REPLACEMENT Left    x 2 knee     Family History: Family History  Problem Relation Name Age of Onset   Anesthesia problems Neg Hx      Social History: Social History   Socioeconomic History   Marital status: Married   Number of children: 1  Occupational History   Occupation: Retired  Tobacco Use   Smoking status: Never   Smokeless tobacco: Never  Vaping Use   Vaping status: Never Used  Substance and Sexual Activity   Alcohol  use: Yes    Comment: socially drinks beer   Drug use: No   Sexual activity: Yes    Partners: Female   Social Drivers of Corporate Investment Banker Strain: Low Risk  (09/09/2024)   Overall Financial Resource Strain (CARDIA)    Difficulty of Paying Living Expenses: Not hard at all  Food Insecurity: No Food Insecurity (09/09/2024)   Hunger Vital Sign    Worried About Running Out of Food in the Last Year: Never true    Ran Out of Food in the Last Year: Never true  Transportation Needs: No Transportation Needs (09/09/2024)   PRAPARE - Administrator, Civil Service (Medical): No    Lack of Transportation (Non-Medical): No     Allergies: No Known Allergies  Medications: Prior to Admission Medications  Prescriptions Last Dose Taking?  ALPRAZolam  (XANAX ) 1 MG tablet Not Taking No  Sig: TK 1 Tablet nightly  Patient not taking: Reported on 09/06/2024  QUEtiapine  (SEROQUEL ) 25 MG tablet 09/05/2024 Yes  Sig: Take 1 tablet (25 mg total) by mouth at bedtime for 120 days  acetaminophen  (TYLENOL ) 325 MG tablet Past Month Yes  Sig: Take 3 tablets (975 mg total) by mouth every 6 (six) hours as needed for Pain for up to 120 doses  amoxicillin (AMOXIL) 500 MG capsule Not Taking No  Sig:  Take 2,000 mg by mouth  Patient not taking: Reported on 09/06/2024  buPROPion (WELLBUTRIN) 75 MG tablet Not Taking No  Sig: Take 75 mg by mouth once daily  Patient not taking: Reported on 09/06/2024  carboxymethylcellulose (REFRESH PLUS) 0.5 % ophthalmic solution Not Taking No  Sig: 1-2 drops as needed for dry / irritated eyes Ophthalmic three times a day As needed  Patient not taking: Reported on 09/06/2024  cetirizine (ZYRTEC) 10 MG tablet Not Taking No  Sig: Take 10 mg by mouth once daily  Patient not taking: Reported on 09/06/2024  cholecalciferol, vitamin D3, (VITAMIN D3 ORAL) Not Taking No  Sig: Take 1 tablet by mouth once daily  Patient not taking: Reported on 09/06/2024  dexAMETHasone  (DECADRON ) 1 MG tablet 09/06/2024 Yes  Sig: Take 2 tablets (2 mg total) by mouth daily with breakfast for 30 days  diclofenac  (VOLTAREN ) 75 MG EC tablet 09/05/2024 Yes  Sig: Take 75 mg by mouth 2 (two) times daily with meals  ibuprofen (MOTRIN) 200 MG tablet Not Taking No  Sig: 1 tablet with food or milk as needed Orally Three times a day  Patient not taking: Reported on 09/06/2024  oxyCODONE  (ROXICODONE ) 5 MG immediate release tablet Not Taking No  Sig: Take 5-10 mg by  mouth  Patient not taking: Reported on 09/06/2024  sertraline  (ZOLOFT ) 100 MG tablet 09/05/2024 Yes  Sig: Take 100 mg by mouth once daily    Facility-Administered Medications: None    Physical Exam: BP (!) 137/90 (BP Location: Right upper arm, Patient Position: Lying)   Pulse 78   Temp 36.4 C (97.5 F)   Resp 17   Ht 170.2 cm (5' 7)   Wt 92.6 kg (204 lb 1.6 oz)   SpO2 97%   BMI 31.97 kg/m  Body mass index is 31.97 kg/m. Body surface area is 2.09 meters squared. KPS: 70 General: This is a well-developed, well-nourished male in no acute distress Head: Normocephalic, atraumatic, symmetrical and without deformities Skin:  Bruises and scabs from prior falls.  Extremities: No clubbing, cyanosis, edema, or varicosities  noted  Neurologic Exam Mental Status: Patient oriented to person, place and time. Aware of current events. Affect is appropriate.  Speech: Fluent with normal language production and no dysarthria.   Coordination: FTN ataxia with right hand is slightly better than yesterday. Romberg: did not assess Gait: did not assess Data:  Labs in chart were reviewed.   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  FINDINGS: Brain Parenchyma: Compared to 07/02/2024, interval increase in size of peripherally enhancing lesion centered in the right medial temporal lobe currently measuring 1.9 x 1.7 cm transaxial, previously 1.2 x 1.5 cm when remeasured for consistency. Mild interval increase in surrounding nonenhancing subcortical T2/FLAIR signal abnormality throughout the right anterior temporal lobe deep white matter. No associated ASL hyperperfusion within limitations of a moderately artifact degraded scan. New small focus of diffusion restriction and T2/FLAIR hyperintensity in the right lateral medulla (series 4 image 8).  Severe confluent deep and periventricular white matter T2/FLAIR hyperintensities, nonspecific but commonly associated with chronic microvascular ischemic change.  Ventricles and Sulci: Mildly prominent consistent with global cerebral volume loss. No evidence of  obstructive hydrocephalus. Extra-Axial Spaces: No extra-axial fluid collection. Basal Cisterns: Normal. Intracranial Flow-Voids: Major intracranial flow voids are preserved. Paranasal Sinuses: Mild polypoid mucosal thickening along the inferior maxillary sinuses bilaterally. Postsurgical changes of prior transsphenoidal resection. Mastoids: Normal. Orbits: Normal. Cranium: Stable postsurgical and posttreatment appearance of the clivus. No new suspicious enhancement. Visualized upper cervical spine: Multilevel degenerative changes with at least mild spinal canal stenosis at C3-C4 on the basis of anterolisthesis and disc osteophyte complex. IMPRESSION: 1.  Compared to 07/02/2024, increased size of irregular peripheral enhancement centered in the right medial temporal lobe with increased surrounding vasogenic edema. No associated ASL hyperperfusion. Overall findings remain most consistent with evolving radiation necrosis. 2.  Stable postsurgical and posttreatment changes in the clivus without evidence of tumor progression. 3.  New small focus of diffusion restriction in the right lateral medulla consistent with recent infarction. 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 Findings were communicated to Dr. Josette Micki Beady at 10:00 AM on 09/07/2024. Electronically Signed by:  Artist Barefoot, MD, [facility] Electronically Signed on:  09/07/2024 10:04 AM   Impression/Recommendations:  Justin Hurst is a 68 y.o. 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   # Clival chordoma s/p STR 2023 and proton RT 2024 Given the radiographic and clinical evidence suggesting worsening radiation necrosis, this warrants dedicated treatment with a definitive course of high dose steroids. 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 He has only been challenged on decadron  2mg  daily for a couple of days leading up to his ED presentation. Prior intolerance to decadron  may have coincided with his stroke symptoms. Patient is tolerating current decadron  burst and taper with improvement in headaches and right VF symptoms even on tapered doses. For these reasons, we recommend:  - Decadron  2mg  daily since 12/21. Patient to remain on that dose until he followup with Dr. Buckley - Avastin on hold for six months given stroke treatment while inpatient - Please ensure patient is taking PPI while on decadron  - I have connected with his outpatient neuro-oncologist Dr. Buckley to arrange outpatient follow up when he is nearing discharge, likely 4 weeks from d/c date since patient will be going to acute rehab   # Subacute/chronic lateral medullary stroke  - patient now on high dose statin and low dose aspirin  - cleared by Speech for regular diet - remaining stroke evaluation and risk reduction per Neurology service  DISPO plan: likely d/c to acute rehab    I have personally completed the above consult note, including the noted history and exam, as well as personal review and interpretation of the laboratory and diagnostic studies as documented in the medical record. I have reviewed the prior records available through our electronic medical record. This consult required approximately 25 minutes on the unit with the patient, with more than 50% of the time spent counseling and coordinating care.   ANNICK DESJARDINS 09/16/2024 1:18 PM  "

## 2024-09-17 NOTE — PMR Pre-admission (Signed)
 PMR Admission Coordinator Pre-Admission Assessment  Patient: Justin Hurst is an 68 y.o., male MRN: [id number] DOB: Mar 24, 1956 Height:   Weight:    Insurance Information HMO:  yes    PPO:      PCP:      IPA:      80/20:      OTHER:  PRIMARY: [employer] Medicare      Policy#: [id number]      Subscriber: pt CM Name: Guido      Phone#: [phone]     Fax#: [phone] Pre-Cert#: [id number] auth for CIR from Arianna with H&CC for admit 09/15/24 with next review date 06/23/25.  Updates due to fax listed above.        Employer:  Benefits:  Phone #: [phone]     Name:  Eff. Date: 09/24/23     Deduct: $0      Out of Pocket Max: $4900 (met [phone])      Life Max: n/a CIR: $325/day for days 1-5      SNF: 20 full days Outpatient:      Co-Pay: $20/visit Home Health: 100%      Co-Pay:  DME: 80%     Co-Pay: 20% Providers: n/a SECONDARY:       Policy#:       Phone#:   Artist:       Phone#:   The Best Boy for patients in Inpatient Rehabilitation Facilities with attached Privacy Act Statement-Health Care Records was provided and verbally reviewed with: Patient and Family  Emergency Contact Information Contact Information     Name Relation Home Work Mobile   [place] Spouse [phone]  [phone]      Other Contacts   None on File     Current Medical History  Patient Admitting Diagnosis: CVA   History of Present Illness: Pt is a 68 y/o male with PMH of 3 TKA on the LLE (periprosthetic fractures), B ankle fusions, chronic pain, and clival chordoma completed radiation therapy in 08/2022 and not currently under treatment who presented to [facility] on 12/15 with progressive decline in mobility and AMS over a few weeks.  Started on decadron  as an outpatient without improvement.  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  Consults to neurology.  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  MRI showed lateral medullary stroke.  Neurology recommended secondary prevention measures and pt will continue aspirin  daily.  Oncology consulted given concern for worsening radiation necrosis and recommended decadron  burst and taper, which pt is tolerating well.  Further follow up/treatment recommendations to occur as an outpatient ~4 week from d/c.  He is tolerating a regular diet.  Therapy ongoing and pt was recommended for CIR.     Patient's medical record from [facility] has been reviewed by the rehabilitation admission coordinator and physician.  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 No  Family History   family history includes ADD / ADHD in his daughter; Arthritis in his  father; Cancer in his mother and sister; Heart disease in his father; Vision loss in his father.  Current Medications Current Medications[1]  Patients Current Diet: Diet reg/thing  Precautions / Restrictions     Has the patient had 2 or more falls or a fall with injury in the past year? Yes  Prior Activity Level Community (5-7x/wk): fully independent, SPC, driving   Prior Functional Level Self Care: Did the patient need help bathing, dressing, using the toilet or eating? Independent  Indoor Mobility: Did the patient need assistance with walking from room to room (with or without device)? Independent  Stairs: Did the patient need assistance with internal or external stairs (with or without device)? Independent  Functional Cognition: Did the patient need help planning regular tasks such as shopping or  remembering to take medications? Independent  Patient Information Are you of Hispanic, Latino/a,or Spanish origin?: A. No, not of Hispanic, Latino/a, or Spanish origin What is your race?: A. White Do you need or want an interpreter to communicate with a doctor or health care staff?: 0. No  Patient's Response To:  Health Literacy and Transportation Is the patient able to respond to health literacy and transportation needs?: Yes Health Literacy - How often do you need to have someone help you when you read instructions, pamphlets, or other written material from your doctor or pharmacy?: Never In the past 12 months, has lack of transportation kept you from medical appointments or from getting medications?: No In the past 12 months, has lack of transportation kept you from meetings, work, or from getting things needed for daily living?: No  Home Assistive Devices / Web Designer (specify quad or straight) (straight)   Prior Device Use: Indicate devices/aids used by the patient prior to current illness, exacerbation or injury? cane   Prior Functional Level Current Functional Level  Bed Mobility  Independent Mod assist   Transfers  Independent  Mod assist   Mobility - Walk/Wheelchair  Independent      Upper Body Dressing  Independent  Min assist   Lower Body Dressing  Independent  Mod assist   Grooming  Independent  Min assist   Eating/Drinking  Independent      Toilet Transfer  Independent  Mod assist   Bladder Continence   continent  incontinent   Bowel Management  continent  incontinent   Stair Climbing  Independent      Communication  Indep  no data   Memory  Indep no data     Special considerations/life events N/a  Previous Home Environment (from acute therapy documentation) Living Arrangements: Spouse/significant other Available Help at Discharge: Family; Available 24 hours/day Type of Home: House Home Layout: Able to live on main level  with bedroom/bathroom (one level with finished basement apartment; can enter through front with 7-8 stairs, or through basement with level entry)) Alternate Level Stairs-Rails: Left Alternate Level Stairs-Number of Steps: full flight Home Access: Stairs to enter Entrance Stairs-Rails: Right; Left Entrance Stairs-Number of Steps: 7-8 Bathroom Shower/Tub: Health Visitor: Standard Bathroom Accessibility: Yes How Accessible: Accessible via walker Home Care Services: No   Discharge Living Setting Plans for Discharge Living Setting: Patient's home; Lives with (comment) (spouse) Type of Home at Discharge: House Discharge Home Layout: One level; Able to live on main level with bedroom/bathroom Discharge Home Access: Stairs to enter Entrance Stairs-Rails: Right; Left Entrance Stairs-Number of Steps: 7-8 Discharge Bathroom Shower/Tub: Walk-in shower Discharge Bathroom Toilet: Standard Discharge Bathroom Accessibility: Yes How Accessible: Accessible via walker  Does the patient have any problems obtaining your medications?: No   Social/Family/Support Systems Patient Roles: Spouse Anticipated Caregiver: [place] Anticipated Caregiver's Contact Information: [phone] Ability/Limitations of Caregiver: min assist Caregiver Availability: 24/7 Discharge Plan Discussed with Primary Caregiver: Yes Is Caregiver In Agreement with Plan?: Yes Does Caregiver/Family have Issues with Lodging/Transportation while Pt is in Rehab?: No   Goals Patient/Family Goal for Rehab: PT/OT/SLP supervision Expected length of stay: 12-14 days Additional Information: Discharge plan: return home with spouse who can provide 24/7 assist Pt/Family Agrees to Admission and willing to participate: Yes Program Orientation Provided & Reviewed with Pt/Caregiver Including Roles  & Responsibilities: Yes   Decrease burden of Care through IP rehab admission: n/a  Possible need for SNF placement upon discharge:  No. Pt mobilizing well and has 24/7 support from his spouse at home.    Patient Condition: I have reviewed medical records from [facility], spoken with CM, and patient and spouse. I discussed via phone for inpatient rehabilitation assessment.  Patient will benefit from ongoing PT, OT, and SLP, can actively participate in 3 hours of therapy a day 5 days of the week, and can make measurable gains during the admission.  Patient will also benefit from the coordinated team approach during an Inpatient Acute Rehabilitation admission.  The patient will receive intensive therapy as well as Rehabilitation physician, nursing, social worker, and care management interventions.  Due to safety, disease management, medication administration, pain management, and patient education the patient requires 24 hour a day rehabilitation nursing.  The patient is currently min assist with mobility and basic ADLs.  Discharge setting and therapy post discharge at home with home health is anticipated.  Patient has agreed to participate in the Acute Inpatient Rehabilitation Program and will admit today.  Preadmission Screen Completed By:  Leita KATHEE Kleine, 09/20/2024 10:04 AM ______________________________________________________________________   Discussed status with Dr. Carilyn on 09/20/24 at 900 and received approval for admission today.  Admission Coordinator:  Leita KATHEE Kleine, CCC-SLP, time 1003/Date 09/20/24   Assessment/Plan: Diagnosis: Right medullary stroke  Does the need for close, 24 hr/day Medical supervision in concert with the patient's rehab needs make it unreasonable for this patient to be served in a less intensive setting? 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 Yes Does the patient require coordinated care of a physician, rehab nurse, PT, OT, and SLP to address physical and functional deficits in the context of the above medical diagnosis(es)? Yes Addressing deficits in the following areas: balance, endurance, locomotion, strength, transferring, bowel/bladder control, bathing, dressing, feeding, and toileting Can the patient actively participate in an intensive therapy program of at least 3 hrs of therapy 5 days a week? Yes The potential for patient to make measurable gains while on inpatient rehab is good Anticipated functional outcomes upon discharge from inpatient rehab: supervision PT, supervision OT, supervision SLP Estimated rehab length of stay to reach the above functional goals is: 12-14d Anticipated discharge destination: Home 10. Overall Rehab/Functional Prognosis: good  MD Signature Prentice CHARLENA Carilyn M.D. [facility] Fellow Am Acad of Phys Med and Rehab Diplomate Am Board of Electrodiagnostic Med Fellow Am Board of Interventional Pain      [1]  Current Outpatient Medications:    acetaminophen  (TYLENOL ) 500 MG tablet, Take 2 tablets (1,000 mg total) by  mouth every 6 (six) hours. (Patient taking differently: Take 1,000 mg by mouth every 6 (six) hours as needed.), Disp: , Rfl:    ALPRAZolam  (XANAX ) 1 MG tablet, Take 1 mg by mouth 2 (two) times daily as needed. (Patient taking differently: Take 0.5 mg by mouth at bedtime.), Disp: , Rfl:    amoxicillin (AMOXIL) 500 MG capsule, Take 2,000 mg by mouth See admin instructions. Take 4 capsules (2000 mg) by mouth 1 hour prior to dental appointments (Patient not taking: Reported on 07/15/2024), Disp: , Rfl:    buPROPion (WELLBUTRIN) 75 MG tablet, Take 75 mg by mouth daily., Disp: , Rfl:    methocarbamol  (ROBAXIN ) 500 MG tablet, Take 1 tablet (500 mg total) by mouth every 6 (six) hours as needed for muscle spasms., Disp: 40 tablet, Rfl: 2    sertraline  (ZOLOFT ) 100 MG tablet, Take 200 mg by mouth at bedtime., Disp: , Rfl:

## 2024-09-20 ENCOUNTER — Inpatient Hospital Stay (HOSPITAL_COMMUNITY)
Admission: AD | Admit: 2024-09-20 | Discharge: 2024-10-08 | DRG: 056 | Disposition: A | Discharge status: Home / Self Care | Source: Other Acute Inpatient Hospital | Attending: Physical Medicine & Rehabilitation | Admitting: Physical Medicine & Rehabilitation

## 2024-09-20 ENCOUNTER — Other Ambulatory Visit: Payer: Self-pay

## 2024-09-20 ENCOUNTER — Encounter: Payer: Self-pay | Admitting: Physical Therapy

## 2024-09-20 ENCOUNTER — Encounter: Payer: Self-pay | Admitting: Physical Medicine and Rehabilitation

## 2024-09-20 ENCOUNTER — Other Ambulatory Visit: Payer: Self-pay | Admitting: Physical Medicine and Rehabilitation

## 2024-09-20 ENCOUNTER — Inpatient Hospital Stay: Admitting: Internal Medicine

## 2024-09-20 ENCOUNTER — Encounter (HOSPITAL_COMMUNITY): Payer: Self-pay | Admitting: Physical Medicine & Rehabilitation

## 2024-09-20 DIAGNOSIS — Z981 Arthrodesis status: Secondary | ICD-10-CM | POA: Diagnosis not present

## 2024-09-20 DIAGNOSIS — I69393 Ataxia following cerebral infarction: Secondary | ICD-10-CM | POA: Diagnosis present

## 2024-09-20 DIAGNOSIS — G629 Polyneuropathy, unspecified: Secondary | ICD-10-CM | POA: Diagnosis present

## 2024-09-20 DIAGNOSIS — I63011 Cerebral infarction due to thrombosis of right vertebral artery: Secondary | ICD-10-CM | POA: Diagnosis not present

## 2024-09-20 DIAGNOSIS — Z79899 Other long term (current) drug therapy: Secondary | ICD-10-CM

## 2024-09-20 DIAGNOSIS — G4700 Insomnia, unspecified: Secondary | ICD-10-CM | POA: Diagnosis present

## 2024-09-20 DIAGNOSIS — R278 Other lack of coordination: Secondary | ICD-10-CM | POA: Diagnosis present

## 2024-09-20 DIAGNOSIS — G936 Cerebral edema: Secondary | ICD-10-CM | POA: Diagnosis present

## 2024-09-20 DIAGNOSIS — N179 Acute kidney failure, unspecified: Secondary | ICD-10-CM | POA: Diagnosis present

## 2024-09-20 DIAGNOSIS — I63541 Cerebral infarction due to unspecified occlusion or stenosis of right cerebellar artery: Secondary | ICD-10-CM

## 2024-09-20 DIAGNOSIS — Z8249 Family history of ischemic heart disease and other diseases of the circulatory system: Secondary | ICD-10-CM

## 2024-09-20 DIAGNOSIS — F32A Depression, unspecified: Secondary | ICD-10-CM | POA: Diagnosis present

## 2024-09-20 DIAGNOSIS — D62 Acute posthemorrhagic anemia: Secondary | ICD-10-CM | POA: Diagnosis present

## 2024-09-20 DIAGNOSIS — Z85830 Personal history of malignant neoplasm of bone: Secondary | ICD-10-CM | POA: Diagnosis not present

## 2024-09-20 DIAGNOSIS — I639 Cerebral infarction, unspecified: Principal | ICD-10-CM | POA: Diagnosis present

## 2024-09-20 DIAGNOSIS — Y842 Radiological procedure and radiotherapy as the cause of abnormal reaction of the patient, or of later complication, without mention of misadventure at the time of the procedure: Secondary | ICD-10-CM | POA: Diagnosis not present

## 2024-09-20 DIAGNOSIS — Z85841 Personal history of malignant neoplasm of brain: Secondary | ICD-10-CM

## 2024-09-20 DIAGNOSIS — H1131 Conjunctival hemorrhage, right eye: Secondary | ICD-10-CM | POA: Diagnosis not present

## 2024-09-20 DIAGNOSIS — T66XXXD Radiation sickness, unspecified, subsequent encounter: Principal | ICD-10-CM

## 2024-09-20 DIAGNOSIS — R296 Repeated falls: Secondary | ICD-10-CM | POA: Diagnosis present

## 2024-09-20 DIAGNOSIS — M25512 Pain in left shoulder: Secondary | ICD-10-CM | POA: Insufficient documentation

## 2024-09-20 DIAGNOSIS — H532 Diplopia: Secondary | ICD-10-CM | POA: Diagnosis present

## 2024-09-20 DIAGNOSIS — K219 Gastro-esophageal reflux disease without esophagitis: Secondary | ICD-10-CM | POA: Diagnosis present

## 2024-09-20 DIAGNOSIS — D649 Anemia, unspecified: Secondary | ICD-10-CM | POA: Insufficient documentation

## 2024-09-20 DIAGNOSIS — G464 Cerebellar stroke syndrome: Secondary | ICD-10-CM | POA: Diagnosis not present

## 2024-09-20 DIAGNOSIS — X58XXXD Exposure to other specified factors, subsequent encounter: Secondary | ICD-10-CM | POA: Diagnosis present

## 2024-09-20 DIAGNOSIS — M19172 Post-traumatic osteoarthritis, left ankle and foot: Secondary | ICD-10-CM | POA: Diagnosis present

## 2024-09-20 DIAGNOSIS — Z85828 Personal history of other malignant neoplasm of skin: Secondary | ICD-10-CM | POA: Diagnosis not present

## 2024-09-20 DIAGNOSIS — K5900 Constipation, unspecified: Secondary | ICD-10-CM | POA: Diagnosis present

## 2024-09-20 DIAGNOSIS — Z96651 Presence of right artificial knee joint: Secondary | ICD-10-CM | POA: Diagnosis present

## 2024-09-20 DIAGNOSIS — I6789 Other cerebrovascular disease: Secondary | ICD-10-CM | POA: Diagnosis not present

## 2024-09-20 DIAGNOSIS — Z96653 Presence of artificial knee joint, bilateral: Secondary | ICD-10-CM | POA: Diagnosis present

## 2024-09-20 DIAGNOSIS — G463 Brain stem stroke syndrome: Secondary | ICD-10-CM | POA: Diagnosis not present

## 2024-09-20 DIAGNOSIS — K5901 Slow transit constipation: Secondary | ICD-10-CM | POA: Diagnosis not present

## 2024-09-20 MED ORDER — DEXAMETHASONE 2 MG PO TABS
2.0000 mg | ORAL_TABLET | Freq: Every day | ORAL | Status: DC
  Administered 2024-09-21 – 2024-10-08 (×18): 2 mg via ORAL
  Filled 2024-09-20 (×18): qty 1

## 2024-09-20 MED ORDER — SENNOSIDES-DOCUSATE SODIUM 8.6-50 MG PO TABS: 2.0000 | ORAL_TABLET | Freq: Every day | ORAL | Status: DC

## 2024-09-20 MED ORDER — MELATONIN 3 MG PO TABS
3.0000 mg | ORAL_TABLET | Freq: Every day | ORAL | Status: DC
  Administered 2024-09-20 – 2024-10-07 (×18): 3 mg via ORAL
  Filled 2024-09-20 (×18): qty 1

## 2024-09-20 MED ORDER — PROCHLORPERAZINE MALEATE 5 MG PO TABS: 5.0000 mg | ORAL_TABLET | Freq: Four times a day (QID) | ORAL | Status: DC | PRN

## 2024-09-20 MED ORDER — ENOXAPARIN SODIUM 40 MG/0.4ML IJ SOSY
40.0000 mg | PREFILLED_SYRINGE | INTRAMUSCULAR | Status: DC
  Administered 2024-09-20 – 2024-10-07 (×18): 40 mg via SUBCUTANEOUS
  Filled 2024-09-20 (×17): qty 0.4

## 2024-09-20 MED ORDER — GUAIFENESIN-DM 100-10 MG/5ML PO SYRP: 5.0000 mL | ORAL_SOLUTION | Freq: Four times a day (QID) | ORAL | Status: DC | PRN

## 2024-09-20 MED ORDER — QUETIAPINE FUMARATE 25 MG PO TABS
37.5000 mg | ORAL_TABLET | Freq: Every day | ORAL | Status: DC
  Administered 2024-09-20 – 2024-10-07 (×18): 37.5 mg via ORAL
  Filled 2024-09-20 (×18): qty 2

## 2024-09-20 MED ORDER — PROCHLORPERAZINE 25 MG RE SUPP: 12.5000 mg | Freq: Four times a day (QID) | RECTAL | Status: DC | PRN

## 2024-09-20 MED ORDER — ATORVASTATIN CALCIUM 80 MG PO TABS
80.0000 mg | ORAL_TABLET | Freq: Every day | ORAL | Status: DC
  Administered 2024-09-21 – 2024-10-08 (×18): 80 mg via ORAL
  Filled 2024-09-20 (×18): qty 1

## 2024-09-20 MED ORDER — NAPHAZOLINE-GLYCERIN 0.012-0.25 % OP SOLN: 1.0000 [drp] | Freq: Four times a day (QID) | OPHTHALMIC | Status: DC | PRN

## 2024-09-20 MED ORDER — THIAMINE MONONITRATE 100 MG PO TABS
100.0000 mg | ORAL_TABLET | Freq: Every day | ORAL | Status: DC
  Administered 2024-09-21 – 2024-10-08 (×18): 100 mg via ORAL
  Filled 2024-09-20 (×18): qty 1

## 2024-09-20 MED ORDER — PROCHLORPERAZINE EDISYLATE 10 MG/2ML IJ SOLN: 5.0000 mg | Freq: Four times a day (QID) | INTRAMUSCULAR | Status: DC | PRN

## 2024-09-20 MED ORDER — DICLOFENAC SODIUM 1 % EX GEL
2.0000 g | Freq: Four times a day (QID) | CUTANEOUS | Status: DC
  Administered 2024-09-20 – 2024-10-06 (×24): 2 g via TOPICAL
  Filled 2024-09-20: qty 100

## 2024-09-20 MED ORDER — PANTOPRAZOLE SODIUM 40 MG PO TBEC
40.0000 mg | DELAYED_RELEASE_TABLET | Freq: Every day | ORAL | Status: DC
  Administered 2024-09-21 – 2024-10-08 (×18): 40 mg via ORAL
  Filled 2024-09-20 (×18): qty 1

## 2024-09-20 MED ORDER — FLEET ENEMA RE ENEM: 1.0000 | ENEMA | Freq: Once | RECTAL | Status: DC | PRN

## 2024-09-20 MED ORDER — ACETAMINOPHEN 325 MG PO TABS
325.0000 mg | ORAL_TABLET | ORAL | Status: DC | PRN
  Administered 2024-09-22 – 2024-09-23 (×2): 650 mg via ORAL
  Filled 2024-09-20 (×2): qty 2

## 2024-09-20 MED ORDER — DIPHENHYDRAMINE HCL 25 MG PO CAPS: 25.0000 mg | ORAL_CAPSULE | Freq: Four times a day (QID) | ORAL | Status: DC | PRN

## 2024-09-20 MED ORDER — SORBITOL 70 % SOLN: 60.0000 mL | Freq: Every day | Status: DC | PRN

## 2024-09-20 MED ORDER — ASPIRIN 81 MG PO TBEC
81.0000 mg | DELAYED_RELEASE_TABLET | Freq: Every day | ORAL | Status: DC
  Administered 2024-09-21 – 2024-10-08 (×18): 81 mg via ORAL
  Filled 2024-09-20 (×18): qty 1

## 2024-09-20 MED ORDER — SALINE SPRAY 0.65 % NA SOLN: 1.0000 | NASAL | Status: DC | PRN

## 2024-09-20 MED ORDER — SERTRALINE HCL 100 MG PO TABS
100.0000 mg | ORAL_TABLET | Freq: Every day | ORAL | Status: DC
  Administered 2024-09-21 – 2024-10-08 (×18): 100 mg via ORAL
  Filled 2024-09-20 (×18): qty 1

## 2024-09-20 MED ORDER — ALUM & MAG HYDROXIDE-SIMETH 200-200-20 MG/5ML PO SUSP: 30.0000 mL | ORAL | Status: DC | PRN

## 2024-09-20 NOTE — Progress Notes (Signed)
 Inpatient Rehabilitation Admission Medication Review by a Pharmacist  A complete drug regimen review was completed for this patient to identify any potential clinically significant medication issues.  High Risk Drug Classes Is patient taking? Indication by Medication  Antipsychotic Yes, as an intravenous medication Compazine - N/V Seroquel - sleep  Anticoagulant Yes Lovenox - vte ppx  Antibiotic No   Opioid No   Antiplatelet Yes Aspirin - cva ppx  Hypoglycemics/insulin No   Vasoactive Medication No   Chemotherapy No   Other Yes Lipitor- HLD Melatonin- sleep Protonix - GERD Zoloft - MDD Benadryl - itching     Type of Medication Issue Identified Description of Issue Recommendation(s)  Drug Interaction(s) (clinically significant)     Duplicate Therapy     Allergy     No Medication Administration End Date     Incorrect Dose     Additional Drug Therapy Needed     Significant med changes from prior encounter (inform family/care partners about these prior to discharge).    Other       Clinically significant medication issues were identified that warrant physician communication and completion of prescribed/recommended actions by midnight of the next day:  No   Time spent performing this drug regimen review (minutes):  30    Delorus Langwell BS, PharmD, BCPS Clinical Pharmacist 09/20/2024 1:09 PM  Contact: [phone] after 3 PM

## 2024-09-20 NOTE — H&P (Signed)
 Physical Medicine and Rehabilitation Admission H&P     CC: Functional deficits due to Right medullary stroke in setting of radiation necrosis.      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 He was admitted to [facility] on 09/06/24 with weakness, ataxia and concerns of worsening of radiation necrosis. MRI brain repeated 12/16 showing increased in size of right medial temporal lobe enhancement with increased in surrounding vasogenic edema c/w tumor necrosis and new small focus of diffusion restriction in right lateral medulla c/w recent infarct. 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 2 D echo with EF 55% with no wall abnormalities or stenosis. BLE dopplers negative for DVT in visualized veins and B-post tibilial and peroneal veins not visualized.    Radiation necrosis treated with increased dose decadron  which has been tapered to 2 mg daily and to continue till follow up with Dr. Buckley. 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 Hospital course significant for myoclonus, hallucinations and insomnia felt to be due to delirium and have resolved. Headaches and right VF symptoms have improved and he was cleared for regular diet. 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 He is able to follow one step commands consistently but has poor awareness of deficits. He was independent prior to recent decline and intensive rehab program recommended for therapy.        Review of Systems  Constitutional:  Negative for chills and fever.  HENT:  Negative for hearing loss.   Eyes:  Positive for blurred vision and double vision (right lateral field).  Respiratory:  Negative for cough.   Cardiovascular:  Positive for chest pain.  Gastrointestinal:  Negative for constipation.  Genitourinary:  Negative for dysuria.       Nocturia X 2-3  Musculoskeletal:  Positive for myalgias.  Skin:  Negative for rash.  Neurological:  Positive for speech change and weakness. Negative for dizziness and headaches.  Psychiatric/Behavioral:  The patient is nervous/anxious and has insomnia.            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 Ernie Left Knee 03-09-18    KNEE ARTHROPLASTY        x2    KNEE ARTHROSCOPY        Left x3   ORIF FEMUR FRACTURE Left 03/09/2018    Procedure: Left knee femoral revision with open reduction internal fixation of distal femur periprosthetic fracture;  Surgeon: Ernie Cough, MD;  Location: [facility]S;  Service: Orthopedics;  Laterality: Left;  Adductor Block   ORIF FEMUR FRACTURE Left 06/26/2023    Procedure: OPEN REDUCTION INTERNAL FIXATION (ORIF) DISTAL FEMUR FRACTURE;  Surgeon: Celena Sharper, MD;  Location: [facility] OR;   Service: Orthopedics;  Laterality: Left;   SHOULDER ARTHROSCOPY        Left   TOTAL KNEE ARTHROPLASTY Right 02/24/2024    Procedure: ARTHROPLASTY, KNEE, TOTAL;  Surgeon: Ernie Cough, MD;  Location: [facility] ORS;  Service: Orthopedics;  Laterality: Right;   TUMOR EXCISION   06/15/2022    brain               Family History  Problem Relation Age of Onset   Cancer Mother     Arthritis Father     Heart disease Father     Vision loss Father     ADD / ADHD Daughter     Cancer Sister            Social History:  reports that he has never smoked. He has never used smokeless tobacco. He reports current alcohol  use of about 10.0 standard drinks of alcohol  per week. He reports that he does not use drugs.     [Allergies]  [Allergies] No Known Allergies   (Not in a hospital admission)              Home:   Functional History:   Functional Status:  Mobility: Min to mod assist for transfers with BUE supported Able to weight shift and march in place with visual, verbal and tactile cues     ADL:   Cognition:   Physical Exam: There were no vitals taken for this visit. Physical Exam Vitals and nursing note reviewed.  Constitutional:      Appearance: Normal appearance.     Comments: Multiple lesions on face and BUE.  Skin:    Comments:  Dry flaky skin BUE   Neurological:     Mental Status: He is alert and oriented    General: No acute distress Mood and affect are appropriate Heart: Regular rate and rhythm no rubs murmurs or extra sounds Lungs: Clear to auscultation, breathing unlabored, no rales or wheezes Abdomen: Positive bowel sounds, soft nontender to palpation, nondistended Extremities: No clubbing, cyanosis, or edema Skin: No evidence of breakdown, see above Neurologic: Cranial nerves II through XII intact, motor strength is 5/5 in bilateral deltoid, bicep, tricep, grip, hip flexor, knee extensors, 3- ankle dorsiflexor and plantar flexor limited by ROM Sensory  exam normal sensation to light touch and proprioception in bilateral upper and lower extremities Reduced sensation right hemifacial Cerebellar exam normal finger to nose to finger as well as heel to shin in left upper and lower extremities mild dysmetria on the right  Speech without dysarthria or aphasia Fine motor finger to thumb opposition is intact Tone is normal bilateral upper and lower limbs Insight and awareness intact Musculoskeletal: Full range of motion reduced in bilateral ankles approximately 25 to 50% of normal ankle dorsiflexion plantarflexion as  well as inversion eversion. No joint swelling    Recent Labs: CBC 09/13/24--WBC 10.7  Hgb 13.9  HCT 42.6  Plts- 292  BMET 09/13/24:  Na 140  K-3.5  Cl- 103  Co2- 26  BUN 24  SCr 1.1 Hgb A1c- 5.5 Chol- 244   HDL-45   LDL-180    Trig-97   There were no vitals taken for this visit.   Medical Problem List and Plan: 1. 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  Antithrombotics: -DVT/anticoagulation:  Pharmaceutical: Lovenox              -antiplatelet therapy: ASA 3. Pain Management: Tylenol  prn 4. Mood/Behavior/Sleep: LCSW to follow for evaluation and support.              -antipsychotic agents: Seroquel  at bedtime (was increased at [facility]).             --continue Seroquel  and melatonin for sleep.  5. Neuropsych/cognition: This patient may be intermittently capable of making decisions on his own behalf. 6. Skin/Wound Care: Routine pressure relief measures. 7. Fluids/Electrolytes/Nutrition: Monitor I/O 8.  Right temporal lobe clival chordoma/radiation necrosis: Decadron  tapered to 2 mg daily since 12/21 9. GERD: On Protonix  10. AKI; BUN/Scr improving form 31-->24 and 1.4-->1.1 11. Chronic right RTC injury:  12. Depression: On zoloft  100 mg daily 13.  Constipation: Has been refusing laxative. Will d/c miralax .  Change senna to once daily if no BM in 24 hours.       Sharlet GORMAN Schmitz, PA-C 09/20/2024 Prentice CHARLENA Compton M.D. [facility] Fellow Am Acad of Phys Med and Rehab Diplomate Am Board of Electrodiagnostic Med Fellow Am Board of Interventional Pain I have personally performed a face to face diagnostic evaluation of this patient.  Additionally, I have reviewed and concur with the physician assistant's documentation above.

## 2024-09-20 NOTE — H&P (Incomplete)
 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 He was admitted to [facility] on 09/06/24 with weakness, ataxia and concerns of worsening of radiation necrosis. MRI brain repeated 12/16 showing increased in size of right medial temporal lobe enhancement with increased in surrounding vasogenic edema c/w tumor necrosis and new small focus of diffusion restriction in right lateral medulla c/w recent infarct. 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 2 D echo with EF 55% with no wall abnormalities or stenosis. BLE dopplers negative for DVT in visualized veins and B-post tibilial and peroneal veins not visualized.   Radiation necrosis treated with increased dose decadron  which has been tapered to 2 mg daily and to continue till follow up with Dr. Buckley. 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 Hospital course significant for myoclonus, hallucinations and insomnia felt to be due to delirium and have resolved. Headaches and right VF symptoms have improved and he was cleared for regular diet. Therapy was consulted and has been working  with patient who continues to be limited by neuropathy with decrease in RLE ataxia and increase in truncal control at EOB and working on pre-gait activity. He is able to follow one step commands consistently but has poor awareness of deficits. He was independent prior to recent decline and intensive rehab program recommended for therapy.     Review of Systems  Constitutional:  Negative for chills and fever.  HENT:  Negative for hearing loss.   Eyes:  Positive for blurred vision and double vision (right lateral field).  Respiratory:  Negative for cough.   Cardiovascular:  Positive for chest pain.  Gastrointestinal:  Negative for constipation.  Genitourinary:  Negative for dysuria.       Nocturia X 2-3  Musculoskeletal:  Positive for myalgias.  Skin:  Negative for rash.  Neurological:  Positive for speech change and weakness. Negative for dizziness and headaches.  Psychiatric/Behavioral:  The patient is nervous/anxious and has insomnia.      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 Ernie Left Knee 03-09-18    KNEE ARTHROPLASTY     x2  KNEE ARTHROSCOPY     Left x3   ORIF FEMUR FRACTURE Left 03/09/2018   Procedure: Left knee femoral revision with open reduction internal fixation of distal femur periprosthetic fracture;  Surgeon: Ernie Cough, MD;  Location: [facility];  Service: Orthopedics;  Laterality: Left;  Adductor Block   ORIF FEMUR FRACTURE Left 06/26/2023   Procedure: OPEN REDUCTION INTERNAL FIXATION (ORIF) DISTAL FEMUR FRACTURE;  Surgeon: Celena Sharper, MD;  Location: [facility] OR;  Service: Orthopedics;  Laterality: Left;   SHOULDER  ARTHROSCOPY     Left   TOTAL KNEE ARTHROPLASTY Right 02/24/2024   Procedure: ARTHROPLASTY, KNEE, TOTAL;  Surgeon: Ernie Cough, MD;  Location: [facility];  Service: Orthopedics;  Laterality: Right;   TUMOR EXCISION  06/15/2022   brain    Family History  Problem Relation Age of Onset   Cancer Mother    Arthritis Father    Heart disease Father    Vision loss Father    ADD / ADHD Daughter    Cancer Sister     Social History:  reports that he has never smoked. He has never used smokeless tobacco. He reports current alcohol  use of about 10.0 standard drinks of alcohol  per week. He reports that he does not use drugs.   Allergies[1]  (Not in a hospital admission)     Home:     Functional History:    Functional Status:  Mobility: Min to mod assist for transfers with BUE supported Able to weight shift and march in place with visual, verbal and tactile cues         ADL:    Cognition:      Physical Exam: There were no vitals taken for this visit. Physical Exam Vitals and nursing note reviewed.  Constitutional:      Appearance: Normal appearance.     Comments: Multiple lesions on face and BUE.  Skin:    Comments:  Dry flaky skin BUE   Neurological:     Mental Status: He is alert.     Recent Labs: CBC 09/13/24--WBC 10.7  Hgb 13.9  HCT 42.6  Plts- 292  BMET 09/13/24:  Na 140  K-3.5  Cl- 103  Co2- 26  BUN 24  SCr 1.1 Hgb A1c- 5.5 Chol- 244   HDL-45   LDL-180    Trig-97  There were no vitals taken for this visit.  Medical Problem List and Plan: 1. Functional deficits secondary to ***  -patient may *** shower  -ELOS/Goals: *** 2.  Antithrombotics: -DVT/anticoagulation:  Pharmaceutical: Lovenox   -antiplatelet therapy: ASA 3. Pain Management: Tylenol  prn 4. Mood/Behavior/Sleep: LCSW to follow for evaluation and support.   -antipsychotic agents: Seroquel  at bedtime (was increased at [facility]).  --continue Seroquel  and melatonin for sleep.  5.  Neuropsych/cognition: This patient may be intermittently capable of making decisions on his own behalf. 6. Skin/Wound Care: Routine pressure relief measures. 7. Fluids/Electrolytes/Nutrition: Monitor I/O 8.  Right temporal lobe clival chordoma/radiation necrosis: Decadron  tapered to 2 mg daily since 12/21 9. GERD: On Protonix  10. AKI; BUN/Scr improving form 31-->24 and 1.4-->1.1 11. Chronic right RTC injury:  12. Depression: On zoloft  100 mg daily 13.  Constipation: Has been refusing laxative. Will d/c miralax . Change senna to once daily if no BM in 24 hours.    ***  Sharlet GORMAN Schmitz, PA-C 09/20/2024     [1] No Known Allergies  "

## 2024-09-20 NOTE — Progress Notes (Signed)
 " PMR Admission Coordinator Pre-Admission Assessment   Patient: Justin Hurst is an 68 y.o., male MRN: [id number] DOB: 11/17/1955 Height:   Weight:     Insurance Information HMO:   yes    PPO:      PCP:      IPA:      80/20:      OTHER:  PRIMARY: [employer] Medicare      Policy#: [id number]      Subscriber: pt CM Name: Guido      Phone#: [phone]     Fax#: [phone] Pre-Cert#: [id number] auth for CIR from Arianna with H&CC for admit 09/15/24 with next review date 09/23/24.  Updates due to fax listed above.        Employer:  Benefits:  Phone #: [phone]     Name:  Eff. Date: 09/24/23     Deduct: $0      Out of Pocket Max: $4900 (met [phone])      Life Max: n/a CIR: $325/day for days 1-5      SNF: 20 full days Outpatient:      Co-Pay: $20/visit Home Health: 100%      Co-Pay:  DME: 80%     Co-Pay: 20% Providers: n/a SECONDARY:       Policy#:       Phone#:    Artist:       Phone#:    The [employer] for patients in Inpatient Rehabilitation Facilities with attached Privacy Act Statement-Health Care Records was provided and verbally reviewed with: Patient and Family   Emergency Contact Information Contact Information       Name Relation Home Work Mobile    [place] Spouse [phone]   [phone]         Other Contacts   None on File        Current Medical History  Patient Admitting Diagnosis: CVA    History of Present Illness: Pt is a 68 y/o male with PMH of 3 TKA on the LLE (periprosthetic fractures), B ankle fusions, chronic pain, and clival chordoma completed radiation therapy in 08/2022 and not currently under treatment who presented to [facility] on 12/15 with progressive decline in mobility and AMS over a few weeks.  Started on decadron  as an outpatient without improvement.  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  Consults to neurology.  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  MRI showed lateral medullary stroke.  Neurology recommended secondary prevention measures and pt will continue aspirin  daily.  Oncology consulted given concern for worsening radiation necrosis and recommended decadron  burst and taper, which pt is tolerating well.  Further follow up/treatment recommendations to occur as an outpatient ~4 week from d/c.  He is tolerating a regular diet.  Therapy ongoing and pt was recommended for CIR.    Patient's medical record from [facility] has been reviewed by the rehabilitation admission coordinator and physician.   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 No   Family History   family history includes ADD / ADHD in his daughter; Arthritis in his father; Cancer in his mother and sister; Heart disease in his father; Vision loss in his father.   Current Medications [Current Medications]  [Current Medications]    Current Outpatient Medications:    acetaminophen  (TYLENOL ) 500 MG tablet, Take 2 tablets (1,000 mg total) by mouth every 6 (six) hours. (Patient taking differently: Take 1,000 mg by mouth every 6 (six) hours as needed.), Disp: , Rfl:    ALPRAZolam  (XANAX ) 1 MG tablet, Take 1 mg by mouth 2 (two) times daily as needed. (Patient taking differently: Take 0.5 mg by mouth at bedtime.), Disp: , Rfl:    amoxicillin (AMOXIL) 500 MG capsule, Take 2,000 mg by mouth See admin instructions. Take 4 capsules (2000 mg) by mouth 1 hour prior to dental appointments (Patient not taking: Reported on 07/15/2024), Disp: , Rfl:     buPROPion (WELLBUTRIN) 75 MG tablet, Take 75 mg by mouth daily., Disp: , Rfl:    methocarbamol  (ROBAXIN ) 500 MG tablet, Take 1 tablet (500 mg total) by mouth every 6 (six) hours as needed for muscle spasms., Disp: 40 tablet, Rfl: 2   sertraline  (ZOLOFT ) 100 MG tablet, Take 200 mg by mouth at bedtime., Disp: , Rfl:     Patients Current Diet: Diet reg/thing   Precautions / Restrictions      Has the patient had 2 or more falls or a fall with injury in the past year? Yes   Prior Activity Level Community (5-7x/wk): fully independent, SPC, driving     Prior Functional Level Self Care: Did the patient need help bathing, dressing, using the toilet or eating? Independent   Indoor Mobility: Did the patient need assistance with walking from room to room (with or without device)? Independent   Stairs: Did the patient need assistance with internal or external stairs (with or without device)? Independent   Functional Cognition: Did the patient need help planning regular tasks such as shopping or remembering to take medications? Independent   Patient Information Are you of Hispanic, Latino/a,or Spanish origin?: A. No, not of Hispanic, Latino/a, or Spanish origin What is your race?: A. White Do you need or want an interpreter to communicate with a doctor or health care staff?: 0. No   Patient's Response To:  Health Literacy and Transportation Is the patient able to respond to health literacy and transportation needs?: Yes Health Literacy - How often do you need to have someone help you when you read instructions, pamphlets, or other written material from your doctor or pharmacy?: Never In the past 12 months, has lack of transportation kept you from medical appointments or from getting medications?: No In the past 12 months, has lack of transportation kept you from meetings, work, or from getting things needed for daily living?: No   Home Assistive Devices / Web Designer (specify quad or  straight) (straight)     Prior Device Use: Indicate devices/aids used by the patient prior to current illness, exacerbation or injury? cane     Prior Functional Level Current Functional Level  Bed Mobility   Independent Mod assist    Transfers   Independent   Mod assist    Mobility - Walk/Wheelchair   Independent      Upper Body Dressing   Independent   Min assist    Lower Body Dressing   Independent  Mod assist    Grooming   Independent   Min assist    Eating/Drinking   Independent      Toilet Transfer   Independent   Mod assist    Bladder Continence    continent   incontinent    Bowel Management   continent   incontinent    Stair Climbing   Independent      Communication   Indep   no data    Memory   Indep no data      Special considerations/life events N/a   Previous Home Environment (from acute therapy documentation) Living Arrangements: Spouse/significant other Available Help at Discharge: Family; Available 24 hours/day Type of Home: House Home Layout: Able to live on main level with bedroom/bathroom (one level with finished basement apartment; can enter through front with 7-8 stairs, or through basement with level entry)) Alternate Level Stairs-Rails: Left Alternate Level Stairs-Number of Steps: full flight Home Access: Stairs to enter Entrance Stairs-Rails: Right; Left Entrance Stairs-Number of Steps: 7-8 Bathroom Shower/Tub: Health Visitor: Standard Bathroom Accessibility: Yes How Accessible: Accessible via walker Home Care Services: No     Discharge Living Setting Plans for Discharge Living Setting: Patient's home; Lives with (comment) (spouse) Type of Home at Discharge: House Discharge Home Layout: One level; Able to live on main level with bedroom/bathroom Discharge Home Access: Stairs to enter Entrance Stairs-Rails: Right; Left Entrance Stairs-Number of Steps: 7-8 Discharge Bathroom Shower/Tub: Walk-in  shower Discharge Bathroom Toilet: Standard Discharge Bathroom Accessibility: Yes How Accessible: Accessible via walker Does the patient have any problems obtaining your medications?: No     Social/Family/Support Systems Patient Roles: Spouse Anticipated Caregiver: Arland Anticipated Caregiver's Contact Information: [phone] Ability/Limitations of Caregiver: min assist Caregiver Availability: 24/7 Discharge Plan Discussed with Primary Caregiver: Yes Is Caregiver In Agreement with Plan?: Yes Does Caregiver/Family have Issues with Lodging/Transportation while Pt is in Rehab?: No     Goals Patient/Family Goal for Rehab: PT/OT/SLP supervision Expected length of stay: 12-14 days Additional Information: Discharge plan: return home with spouse who can provide 24/7 assist Pt/Family Agrees to Admission and willing to participate: Yes Program Orientation Provided & Reviewed with Pt/Caregiver Including Roles  & Responsibilities: Yes     Decrease burden of Care through IP rehab admission: n/a   Possible need for SNF placement upon discharge: No. Pt mobilizing well and has 24/7 support from his spouse at home.     Patient Condition: I have reviewed medical records from [facility], spoken with CM, and patient and spouse. I discussed via phone for inpatient rehabilitation assessment.  Patient will benefit from ongoing PT, OT, and SLP, can actively participate in 3 hours of therapy a day 5 days of the week, and can make measurable gains during the admission.  Patient will also benefit from the coordinated team approach during an Inpatient Acute Rehabilitation admission.  The patient will receive intensive therapy as well as Rehabilitation physician, nursing, social worker, and care management interventions.  Due to safety, disease management, medication administration, pain management, and patient education the patient requires 24 hour a day rehabilitation nursing.  The patient is currently min assist with  mobility and basic ADLs.  Discharge setting and therapy post discharge at home with home health is anticipated.  Patient has agreed to participate in the Acute Inpatient Rehabilitation Program and will admit today.   Preadmission Screen Completed By:  Leita KATHEE Kleine, 09/20/2024 10:04 AM ______________________________________________________________________   Discussed status with Dr. Carilyn on 09/20/24 at 900 and  received approval for admission today.   Admission Coordinator:  Leita KATHEE Kleine, CCC-SLP, time 1003/Date 09/20/24    Assessment/Plan: Diagnosis: Right medullary stroke  Does the need for close, 24 hr/day Medical supervision in concert with the patient's rehab needs make it unreasonable for this patient to be served in a less intensive setting? Yes Co-Morbidities requiring supervision/potential complications: bilateral ankle fusions, hx of radiation necrosis in temporal lobe  Due to bladder management, bowel management, safety, skin/wound care, disease management, medication administration, pain management, and patient education, does the patient require 24 hr/day rehab nursing? Yes Does the patient require coordinated care of a physician, rehab nurse, PT, OT, and SLP to address physical and functional deficits in the context of the above medical diagnosis(es)? Yes Addressing deficits in the following areas: balance, endurance, locomotion, strength, transferring, bowel/bladder control, bathing, dressing, feeding, and toileting Can the patient actively participate in an intensive therapy program of at least 3 hrs of therapy 5 days a week? Yes The potential for patient to make measurable gains while on inpatient rehab is good Anticipated functional outcomes upon discharge from inpatient rehab: supervision PT, supervision OT, supervision SLP Estimated rehab length of stay to reach the above functional goals is: 12-14d Anticipated discharge destination: Home 10. Overall Rehab/Functional  Prognosis: good   MD Signature Prentice CHARLENA Compton M.D. [facility] Fellow Am Acad of Phys Med and Rehab Diplomate Am Board of Electrodiagnostic Med Fellow Am Board of Interventional Pain              Cosigned by: Compton Prentice BRAVO, MD at 09/20/2024 11:32 AM   Revision History  Date/Time User Provider Type Action  09/20/2024 11:32 AM Kirsteins, Prentice BRAVO, MD Physician Cosign  09/20/2024 10:18 AM Compton, Prentice BRAVO, MD Physician Sign  09/20/2024 10:04 AM Kleine Leita KATHEE, CCC-SLP Rehab Admission Coordinator Share  09/17/2024 10:12 AM Butler Reche BRAVO, PT Rehab Admission Coordinator Share   "

## 2024-09-20 NOTE — Progress Notes (Signed)
 Patient ID: Justin Hurst, male   DOB: [birth year], 68 y.o.   MRN: [id number] Met with the patient to review current medical situation, rehab process, team conference and plan of care. Discussed medications and dietary modification recommendations for secondary risk management including ASA, Lipitor and Thiamine . Right shin wound (vashe daily). Continue to follow along to address educational needs to facilitate preparation for discharge. Fredericka Barnie NOVAK

## 2024-09-21 DIAGNOSIS — I63011 Cerebral infarction due to thrombosis of right vertebral artery: Secondary | ICD-10-CM | POA: Diagnosis not present

## 2024-09-21 DIAGNOSIS — I6789 Other cerebrovascular disease: Secondary | ICD-10-CM | POA: Diagnosis not present

## 2024-09-21 DIAGNOSIS — G464 Cerebellar stroke syndrome: Secondary | ICD-10-CM | POA: Diagnosis not present

## 2024-09-21 DIAGNOSIS — Y842 Radiological procedure and radiotherapy as the cause of abnormal reaction of the patient, or of later complication, without mention of misadventure at the time of the procedure: Secondary | ICD-10-CM | POA: Diagnosis not present

## 2024-09-21 LAB — COMPREHENSIVE METABOLIC PANEL WITH GFR
ALT: 33 U/L (ref 0–44)
AST: 18 U/L (ref 15–41)
Albumin: 3.7 g/dL (ref 3.5–5.0)
Alkaline Phosphatase: 113 U/L (ref 38–126)
Anion gap: 9 (ref 5–15)
BUN: 24 mg/dL — ABNORMAL HIGH (ref 8–23)
CO2: 27 mmol/L (ref 22–32)
Calcium: 9.3 mg/dL (ref 8.9–10.3)
Chloride: 102 mmol/L (ref 98–111)
Creatinine, Ser: 0.93 mg/dL (ref 0.61–1.24)
GFR, Estimated: >60 mL/min
Glucose, Bld: 92 mg/dL (ref 70–99)
Potassium: 4.3 mmol/L (ref 3.5–5.1)
Sodium: 138 mmol/L (ref 135–145)
Total Bilirubin: 0.4 mg/dL (ref 0.0–1.2)
Total Protein: 7 g/dL (ref 6.5–8.1)

## 2024-09-21 LAB — CBC WITH DIFFERENTIAL/PLATELET
Abs Immature Granulocytes: 0.12 K/uL — ABNORMAL HIGH (ref 0.00–0.07)
Basophils Absolute: 0 K/uL (ref 0.0–0.1)
Basophils Relative: 0 %
Eosinophils Absolute: 0.1 K/uL (ref 0.0–0.5)
Eosinophils Relative: 1 %
HCT: 40.1 % (ref 39.0–52.0)
Hemoglobin: 13.7 g/dL (ref 13.0–17.0)
Immature Granulocytes: 1 %
Lymphocytes Relative: 17 %
Lymphs Abs: 1.7 K/uL (ref 0.7–4.0)
MCH: 30.9 pg (ref 26.0–34.0)
MCHC: 34.2 g/dL (ref 30.0–36.0)
MCV: 90.5 fL (ref 80.0–100.0)
Monocytes Absolute: 0.6 K/uL (ref 0.1–1.0)
Monocytes Relative: 6 %
Neutro Abs: 7.6 K/uL (ref 1.7–7.7)
Neutrophils Relative %: 75 %
Platelets: 266 K/uL (ref 150–400)
RBC: 4.43 MIL/uL (ref 4.22–5.81)
RDW: 13.4 % (ref 11.5–15.5)
WBC: 10.1 K/uL (ref 4.0–10.5)
nRBC: 0 % (ref 0.0–0.2)

## 2024-09-21 MED ORDER — SENNOSIDES-DOCUSATE SODIUM 8.6-50 MG PO TABS
2.0000 | ORAL_TABLET | Freq: Every day | ORAL | Status: DC | PRN
  Administered 2024-09-25: 2 via ORAL
  Filled 2024-09-21: qty 2

## 2024-09-21 NOTE — Plan of Care (Signed)
" °  Problem: RH Balance Goal: LTG: Patient will maintain dynamic sitting balance (OT) Description: LTG:  Patient will maintain dynamic sitting balance with assistance during activities of daily living (OT) Flowsheets (Taken 09/21/2024 1210) LTG: Pt will maintain dynamic sitting balance during ADLs with: Independent with assistive device Goal: LTG Patient will maintain dynamic standing with ADLs (OT) Description: LTG:  Patient will maintain dynamic standing balance with assist during activities of daily living (OT)  Flowsheets (Taken 09/21/2024 1210) LTG: Pt will maintain dynamic standing balance during ADLs with: Supervision/Verbal cueing   Problem: Sit to Stand Goal: LTG:  Patient will perform sit to stand in prep for activites of daily living with assistance level (OT) Description: LTG:  Patient will perform sit to stand in prep for activites of daily living with assistance level (OT) Flowsheets (Taken 09/21/2024 1210) LTG: PT will perform sit to stand in prep for activites of daily living with assistance level: Supervision/Verbal cueing   Problem: RH Bathing Goal: LTG Patient will bathe all body parts with assist levels (OT) Description: LTG: Patient will bathe all body parts with assist levels (OT) Flowsheets (Taken 09/21/2024 1210) LTG: Pt will perform bathing with assistance level/cueing: Supervision/Verbal cueing   Problem: RH Dressing Goal: LTG Patient will perform upper body dressing (OT) Description: LTG Patient will perform upper body dressing with assist, with/without cues (OT). Flowsheets (Taken 09/21/2024 1210) LTG: Pt will perform upper body dressing with assistance level of: Independent with assistive device Goal: LTG Patient will perform lower body dressing w/assist (OT) Description: LTG: Patient will perform lower body dressing with assist, with/without cues in positioning using equipment (OT) Flowsheets (Taken 09/21/2024 1210) LTG: Pt will perform lower body dressing  with assistance level of: Supervision/Verbal cueing   Problem: RH Toileting Goal: LTG Patient will perform toileting task (3/3 steps) with assistance level (OT) Description: LTG: Patient will perform toileting task (3/3 steps) with assistance level (OT)  Flowsheets (Taken 09/21/2024 1210) LTG: Pt will perform toileting task (3/3 steps) with assistance level: Supervision/Verbal cueing   Problem: RH Light Housekeeping Goal: LTG Patient will perform light housekeeping w/assist (OT) Description: LTG: Patient will perform light housekeeping with assistance, with/without cues (OT). Flowsheets (Taken 09/21/2024 1210) LTG: Pt will perform light housekeeping with assistance level of: Supervision/Verbal cueing   Problem: RH Toilet Transfers Goal: LTG Patient will perform toilet transfers w/assist (OT) Description: LTG: Patient will perform toilet transfers with assist, with/without cues using equipment (OT) Flowsheets (Taken 09/21/2024 1210) LTG: Pt will perform toilet transfers with assistance level of: Supervision/Verbal cueing   "

## 2024-09-21 NOTE — Progress Notes (Signed)
 "                                                        PROGRESS NOTE   Subjective/Complaints:  No issues overnite   ROS- neg CP, SOB, N/V/D  Objective:   No results found. Recent Labs    09/21/24 0431  WBC 10.1  HGB 13.7  HCT 40.1  PLT 266   Recent Labs    09/21/24 0431  NA 138  K 4.3  CL 102  CO2 27  GLUCOSE 92  BUN 24*  CREATININE 0.93  CALCIUM  9.3    Intake/Output Summary (Last 24 hours) at 09/21/2024 0833 Last data filed at 09/21/2024 0520 Gross per 24 hour  Intake --  Output 1300 ml  Net -1300 ml        Physical Exam: Vital Signs Blood pressure 129/82, pulse 62, temperature 98.3 F (36.8 C), resp. rate 20, height 5' 7 (1.702 m), weight 92.6 kg, SpO2 96%.   General: No acute distress Mood and affect are appropriate Heart: Regular rate and rhythm no rubs murmurs or extra sounds Lungs: Clear to auscultation, breathing unlabored, no rales or wheezes Abdomen: Positive bowel sounds, soft nontender to palpation, nondistended Extremities: No clubbing, cyanosis, or edema Skin: No evidence of breakdown, no evidence of rash Neurologic: Cranial nerves II through XII intact, motor strength is 5/5 in bilateral deltoid, bicep, tricep, grip, hip flexor, knee extensors, ankle dorsiflexor and plantar flexor Sensory exam normal sensation to light touch  in bilateral upper and lower extremities Cerebellar exam normal finger to nose to finger as well as heel to shin in bilateral upper and lower extremities Musculoskeletal: ~50% ankle PF/DF, inv/ev   Assessment/Plan: 1. Functional deficits which require 3+ hours per day of interdisciplinary therapy in a comprehensive inpatient rehab setting. Physiatrist is providing close team supervision and 24 hour management of active medical problems listed below. Physiatrist and rehab team continue to assess barriers to discharge/monitor patient progress toward functional and medical goals  Care Tool:  Bathing               Bathing assist       Upper Body Dressing/Undressing Upper body dressing        Upper body assist      Lower Body Dressing/Undressing Lower body dressing            Lower body assist       Toileting Toileting    Toileting assist       Transfers Chair/bed transfer  Transfers assist           Locomotion Ambulation   Ambulation assist              Walk 10 feet activity   Assist           Walk 50 feet activity   Assist           Walk 150 feet activity   Assist           Walk 10 feet on uneven surface  activity   Assist           Wheelchair     Assist               Wheelchair 50 feet with 2 turns activity    Assist  Wheelchair 150 feet activity     Assist          Blood pressure 129/82, pulse 62, temperature 98.3 F (36.8 C), resp. rate 20, height 5' 7 (1.702 m), weight 92.6 kg, SpO2 96%.   Medical Problem List and Plan: 1. 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  Antithrombotics: -DVT/anticoagulation:  Pharmaceutical: Lovenox              -antiplatelet therapy: ASA 3. Pain Management: Tylenol  prn 4. Mood/Behavior/Sleep: LCSW to follow for evaluation and support.              -antipsychotic agents: Seroquel  at bedtime (was increased at [facility]).             --continue Seroquel  and melatonin for sleep.  5. Neuropsych/cognition: This patient may be intermittently capable of making decisions on his own behalf. 6. Skin/Wound Care: Routine pressure relief measures. 7. Fluids/Electrolytes/Nutrition: Monitor I/O 8.  Right temporal lobe clival chordoma/radiation necrosis: Decadron  tapered to 2 mg daily since 12/21 9. GERD: On Protonix  10. AKI; BUN/Scr improving form 31-->24 and 1.4-->1.1 11. Chronic right RTC injury:  12. Depression: On zoloft  100 mg  daily 13.  Constipation: Has been refusing laxative. Will d/c miralax . Change senna to once daily if no BM in 24 hours.     LOS: 1 days A FACE TO FACE EVALUATION WAS PERFORMED  Justin Hurst 09/21/2024, 8:33 AM     "

## 2024-09-21 NOTE — Evaluation (Signed)
 Occupational Therapy Assessment and Plan  Patient Details  Name: Justin Hurst MRN: [id number] Date of Birth: June 09, 1956  OT Diagnosis: ataxia and muscle weakness (generalized) Rehab Potential: Rehab Potential (ACUTE ONLY): Good ELOS: 14-18 days   Today's Date: 09/21/2024 OT Individual Time: 9167-9054 OT Individual Time Calculation (min): 73 min     Hospital Problem: Principal Problem:   Stroke (cerebrum) [facility])   Past Medical History:  Past Medical History:  Diagnosis Date   Arthritis    Brain cancer (HCC)    Cancer (HCC)    hx of brain cancer- tx in 2023   Dyspnea    GERD (gastroesophageal reflux disease)    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 Ernie Left Knee 03-09-18    KNEE ARTHROPLASTY     x2    KNEE ARTHROSCOPY     Left x3   ORIF FEMUR FRACTURE Left 03/09/2018   Procedure: Left knee femoral revision with open reduction internal fixation of distal femur periprosthetic fracture;  Surgeon: Ernie Cough, MD;  Location: [facility] ORS;  Service: Orthopedics;  Laterality: Left;  Adductor Block   ORIF FEMUR FRACTURE Left 06/26/2023   Procedure: OPEN REDUCTION INTERNAL FIXATION (ORIF) DISTAL FEMUR FRACTURE;  Surgeon: Celena Sharper, MD;  Location: [facility] OR;  Service: Orthopedics;  Laterality: Left;   SHOULDER ARTHROSCOPY     Left   TOTAL KNEE ARTHROPLASTY Right 02/24/2024   Procedure: ARTHROPLASTY, KNEE, TOTAL;  Surgeon: Ernie Cough, MD;  Location: [facility] ORS;  Service: Orthopedics;  Laterality: Right;   TUMOR EXCISION  06/15/2022   brain    Assessment & Plan Clinical Impression: Patient is a 68 y.o. 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 He was admitted to [facility] on 09/06/24 with weakness, ataxia and concerns of worsening of radiation necrosis. MRI brain repeated 12/16 showing increased in size of right medial temporal lobe enhancement with increased in surrounding vasogenic edema c/w tumor necrosis and new small focus of diffusion restriction in right lateral medulla c/w recent infarct. 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 2 D echo with EF 55% with no wall abnormalities or stenosis. BLE dopplers negative for DVT in visualized veins and B-post tibilial and peroneal veins not visualized.    Radiation necrosis treated with increased dose decadron  which has been tapered to 2 mg daily and to continue till follow up with Dr. Buckley. 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 Hospital course significant for myoclonus, hallucinations and insomnia felt to be due to delirium and have resolved. Headaches and right VF symptoms have  improved and he was cleared for regular diet. 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 He is able to follow one step commands consistently but has poor awareness of deficits. He was independent prior to recent decline and intensive rehab program recommended for therapy.     Patient currently requires max with basic  self-care skills secondary to muscle weakness, decreased cardiorespiratoy endurance, motor apraxia, ataxia, decreased coordination, and decreased motor planning, decreased visual acuity, decreased midline orientation and decreased motor planning, and decreased standing balance, decreased postural control, and decreased balance strategies.  Prior to hospitalization, patient could complete ADLS, IADLs with modified independent .  Patient will benefit from skilled intervention to decrease level of assist with basic self-care skills and increase independence with basic self-care skills prior to discharge home with care partner.  Anticipate patient will require 24 hour supervision and follow up home health.  OT - End of Session Activity Tolerance: Tolerates 30+ min activity with multiple rests Endurance Deficit: Yes OT Assessment Rehab Potential (ACUTE ONLY): Good OT Barriers to Discharge: Decreased caregiver support OT Patient demonstrates impairments in the following area(s): Balance;Sensory;Vision;Endurance;Motor;Perception;Safety OT Basic ADL's Functional Problem(s): Bathing;Dressing;Toileting OT Transfers Functional Problem(s): Toilet;Tub/Shower OT Additional Impairment(s): Fuctional Use of Upper Extremity OT Plan OT Intensity: Minimum of 1-2 x/day, 45 to 90 minutes OT Frequency: Total of 15 hours over 7 days of combined therapies OT Treatment/Interventions: Balance/vestibular training;Disease mangement/prevention;Neuromuscular re-education;Therapeutic Exercise;Self Care/advanced ADL retraining;UE/LE Strength taining/ROM;Wheelchair propulsion/positioning;Psychosocial support;Therapeutic Activities;Visual/perceptual remediation/compensation;Functional mobility training;Discharge planning;UE/LE Coordination activities;Patient/family education;Community reintegration;Pain management;DME/adaptive equipment instruction;Cognitive remediation/compensation OT Self Feeding Anticipated Outcome(s): mod I OT  Basic Self-Care Anticipated Outcome(s): SUP OT Toileting Anticipated Outcome(s): SUP OT Bathroom Transfers Anticipated Outcome(s): SUP OT Recommendation Recommendations for Other Services: None Patient destination: Home Follow Up Recommendations: Home health OT Equipment Recommended: 3 in 1 bedside comode;Rolling walker with 5 wheels;Tub/shower seat   OT Evaluation Precautions/Restrictions  Precautions Precautions: Fall Restrictions Weight Bearing Restrictions Per Provider Order: No General Chart Reviewed: Yes PT Missed Treatment Reason: Not applicable Response to Previous Treatment: Not applicable Family/Caregiver Present: No Vital Signs   Pain Pain Assessment Pain Scale: 0-10 Pain Score: 0-No pain Home Living/Prior Functioning Home Living Family/patient expects to be discharged to:: Private residence Living Arrangements: Spouse/significant other Available Help at Discharge: Available 24 hours/day, Family Type of Home: House Home Access: Stairs to enter Secretary/administrator of Steps: 7-8 Entrance Stairs-Rails: Right, Left Home Layout: Able to live on main level with bedroom/bathroom Alternate Level Stairs-Number of Steps: full flight on driveway, Alternate Level Stairs-Rails: Left Bathroom Shower/Tub: Health Visitor: Standard Bathroom Accessibility: Yes  Lives With: Significant other IADL History Homemaking Responsibilities: Yes Meal Prep Responsibility: Secondary Laundry Responsibility: Secondary Cleaning Responsibility: Secondary Bill Paying/Finance Responsibility: Secondary Current License: Yes Mode of Transportation: Car Occupation: Retired Prior Function Level of Independence: Independent with basic ADLs, Independent with homemaking with wheelchair  Able to Take Stairs?: Yes Driving: Yes Vocation: Retired Administrator, Sports Baseline Vision/History: 1 Wears glasses (readers) Ability to See in Adequate Light: 0 Adequate Patient Visual Report: No  change from baseline Vision Assessment?: Vision impaired- to be further tested in functional context Additional Comments: vision is blurry Perception  Perception: Impaired Praxis Praxis: Impaired Praxis Impairment Details: Motor planning Cognition Cognition Overall Cognitive Status: Within Functional Limits for tasks assessed Arousal/Alertness: Awake/alert Memory: Appears intact Awareness: Appears intact Problem Solving: Impaired Safety/Judgment: Appears intact Brief  Interview for Mental Status (BIMS) Repetition of Three Words (First Attempt): 3 Temporal Orientation: Year: Correct Temporal Orientation: Month: Accurate within 5 days Temporal Orientation: Day: Correct Recall: Sock: Yes, no cue required Recall: Blue: Yes, no cue required Recall: Bed: Yes, no cue required BIMS Summary Score: 15 Sensation Sensation Light Touch: Impaired by gross assessment Proprioception: Impaired by gross assessment Coordination Gross Motor Movements are Fluid and Coordinated: No Fine Motor Movements are Fluid and Coordinated: No Coordination and Movement Description: decreased coordination especially in LE Finger Nose Finger Test: small overshoot Motor  Motor Motor: Ataxia;Motor apraxia  Trunk/Postural Assessment     Balance   Extremity/Trunk Assessment RUE Assessment RUE Assessment: Exceptions to [facility] Active Range of Motion (AROM) Comments: arom slightly decreased due to older AC separation per patient General Strength Comments: 4-/5 LUE Assessment LUE Assessment: Within Functional Limits General Strength Comments: 4+/5  Care Tool Care Tool Self Care Eating   Eating Assist Level: Set up assist    Oral Care    Oral Care Assist Level: Supervision/Verbal cueing    Bathing   Body parts bathed by patient: Right arm;Left arm;Chest;Abdomen;Front perineal area;Buttocks;Right upper leg;Left upper leg;Right lower leg;Face;Left lower leg Body parts bathed by helper: Right lower  leg   Assist Level: Maximal Assistance - Patient 24 - 49%    Upper Body Dressing(including orthotics)   What is the patient wearing?: Pull over shirt   Assist Level: Contact Guard/Touching assist    Lower Body Dressing (excluding footwear)   What is the patient wearing?: Underwear/pull up;Pants Assist for lower body dressing: Maximal Assistance - Patient 25 - 49%    Putting on/Taking off footwear     Assist for footwear: Maximal Assistance - Patient 25 - 49%       Care Tool Toileting Toileting activity   Assist for toileting: Maximal Assistance - Patient 25 - 49%     Care Tool Bed Mobility Roll left and right activity   Roll left and right assist level: Contact Guard/Touching assist    Sit to lying activity   Sit to lying assist level: Contact Guard/Touching assist    Lying to sitting on side of bed activity   Lying to sitting on side of bed assist level: the ability to move from lying on the back to sitting on the side of the bed with no back support.: Contact Guard/Touching assist     Care Tool Transfers Sit to stand transfer   Sit to stand assist level: Moderate Assistance - Patient 50 - 74%    Chair/bed transfer   Chair/bed transfer assist level: Maximal Assistance - Patient 25 - 49%     Toilet transfer   Assist Level: Maximal Assistance - Patient 24 - 49%     Care Tool Cognition  Expression of Ideas and Wants Expression of Ideas and Wants: 4. Without difficulty (complex and basic) - expresses complex messages without difficulty and with speech that is clear and easy to understand  Understanding Verbal and Non-Verbal Content Understanding Verbal and Non-Verbal Content: 4. Understands (complex and basic) - clear comprehension without cues or repetitions   Memory/Recall Ability Memory/Recall Ability : Current season;That he or she is in a hospital/hospital unit;Staff names and faces   Refer to Care Plan for Long Term Goals  SHORT TERM GOAL WEEK 1 OT Short Term  Goal 1 (Week 1): patient will complete toilet transfer mod A with increased body awareness OT Short Term Goal 2 (Week 1): patient will demonstrate ability to complete UB  dressing with increased dynamic balance with SUP sitting on EOB OT Short Term Goal 3 (Week 1): patient will complete LB dressing standing to pull pants up with min A with increased standing balance  Recommendations for other services: None    Skilled Therapeutic Intervention ADL ADL Equipment Provided: Reacher Eating: Set up Where Assessed-Eating: Bed level Grooming: Supervision/safety Where Assessed-Grooming: Wheelchair;Sitting at sink Upper Body Bathing: Supervision/safety Where Assessed-Upper Body Bathing: Sitting at sink Lower Body Bathing: Maximal assistance Where Assessed-Lower Body Bathing: Standing at sink;Sitting at sink Upper Body Dressing: Supervision/safety Where Assessed-Upper Body Dressing: Wheelchair Lower Body Dressing: Moderate assistance;Maximal assistance Where Assessed-Lower Body Dressing: Standing at sink Toileting: Modified independent;Maximal assistance Where Assessed-Toileting: Teacher, Adult Education: Maximal Dentist Method: Surveyor, Minerals: Acupuncturist: Unable to assess [employer] Method: Unable to assess Mobility  Bed Mobility Bed Mobility: Supine to Sit;Sit to Supine Supine to Sit: Contact Guard/Touching assist Sit to Supine: Contact Guard/Touching assist Transfers Sit to Stand: Moderate Assistance - Patient 50-74% Stand to Sit: Moderate Assistance - Patient 50-74%   Discharge Criteria: Patient will be discharged from OT if patient refuses treatment 3 consecutive times without medical reason, if treatment goals not met, if there is a change in medical status, if patient makes no progress towards goals or if patient is discharged from hospital.  The above assessment, treatment plan, treatment alternatives and  goals were discussed and mutually agreed upon: by patient  Michaelyn LITTIE Seip 09/21/2024, 9:43 AM

## 2024-09-21 NOTE — Evaluation (Signed)
 Physical Therapy Assessment and Plan  Patient Details  Name: Justin Hurst MRN: [id number] Date of Birth: [birth year]  PT Diagnosis: Abnormal posture, Ataxia, Coordination disorder, Difficulty walking, and Impaired sensation Rehab Potential: Good ELOS: 14-18 days   Today's Date: 09/21/2024 PT Individual Time: 8694-8584 PT Individual Time Calculation (min): 70 min    Hospital Problem: Principal Problem:   Stroke (cerebrum) [facility])   Past Medical History:  Past Medical History:  Diagnosis Date   Arthritis    Brain cancer (HCC)    Cancer (HCC)    hx of brain cancer- tx in 2023   Dyspnea    GERD (gastroesophageal reflux disease)    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 Ernie Left Knee 03-09-18    KNEE ARTHROPLASTY     x2    KNEE ARTHROSCOPY     Left x3   ORIF FEMUR FRACTURE Left 03/09/2018   Procedure: Left knee femoral revision with open reduction internal fixation of distal femur periprosthetic fracture;  Surgeon: Ernie Cough, MD;  Location: [facility];  Service: Orthopedics;  Laterality: Left;  Adductor Block   ORIF FEMUR FRACTURE Left 06/26/2023   Procedure: OPEN REDUCTION INTERNAL FIXATION (ORIF) DISTAL FEMUR FRACTURE;  Surgeon: Celena Sharper, MD;  Location: [facility] OR;  Service: Orthopedics;  Laterality: Left;   SHOULDER ARTHROSCOPY     Left   TOTAL KNEE ARTHROPLASTY Right 02/24/2024   Procedure: ARTHROPLASTY, KNEE, TOTAL;  Surgeon: Ernie Cough, MD;  Location: [facility];  Service: Orthopedics;  Laterality: Right;   TUMOR EXCISION  06/15/2022   brain    Assessment & Plan Clinical Impression: Patient is a 68 y/o male with PMH of 3 TKA on the LLE (periprosthetic fractures), B ankle fusions, chronic pain, and clival chordoma completed radiation therapy in 08/2022 and not currently under  treatment who presented to [facility] on 12/15 with progressive decline in mobility and AMS over a few weeks. Started on decadron  as an outpatient without improvement. 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 Consults to neurology. 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 MRI showed lateral medullary stroke. Neurology recommended secondary prevention measures and pt will continue aspirin  daily. Oncology consulted given concern for worsening radiation necrosis and recommended decadron  burst and taper, which pt is tolerating well. Further follow up/treatment recommendations to occur as an outpatient ~4 week from d/c. He is tolerating a regular diet. Therapy ongoing and pt was recommended for CIR.  Patient currently requires mod with mobility secondary to  , decreased cardiorespiratoy endurance, impaired timing and sequencing, unbalanced muscle activation, motor apraxia, ataxia, and decreased coordination, decreased midline orientation and decreased attention to right, and decreased sitting balance, decreased standing balance, decreased postural control, hemiplegia, and decreased balance strategies.  Prior to hospitalization, patient was modified independent  with mobility and lived with Significant other in a House home.  Home access is 7-8Stairs to enter.  Patient will benefit from skilled PT intervention to maximize safe functional mobility, minimize fall risk, and decrease caregiver burden for planned discharge home with 24 hour supervision.  Anticipate patient will benefit  from follow up OP at discharge.  PT - End of Session Activity Tolerance: Tolerates 30+ min activity with multiple rests Endurance Deficit: Yes Endurance Deficit Description: rest breaks with all mobility tasks PT Assessment Rehab Potential  (ACUTE/IP ONLY): Good PT Barriers to Discharge: Home environment access/layout;Inaccessible home environment;Decreased caregiver support PT Barriers to Discharge Comments: limited support to provide current assist level, 14 STE with unilateral handrail PT Patient demonstrates impairments in the following area(s): Balance;Behavior;Endurance;Motor;Perception;Sensory;Safety PT Transfers Functional Problem(s): Bed Mobility;Car;Bed to Chair;Furniture PT Locomotion Functional Problem(s): Ambulation;Stairs PT Plan PT Intensity: Minimum of 1-2 x/day ,45 to 90 minutes PT Frequency: 5 out of 7 days PT Duration Estimated Length of Stay: 14-18 days PT Treatment/Interventions: Ambulation/gait training;Community reintegration;DME/adaptive equipment instruction;Neuromuscular re-education;Psychosocial support;UE/LE Strength taining/ROM;Stair training;Balance/vestibular training;Discharge planning;Therapeutic Activities;UE/LE Coordination activities;Cognitive remediation/compensation;Disease management/prevention;Functional mobility training;Patient/family education;Therapeutic Exercise PT Transfers Anticipated Outcome(s): supervision PT Locomotion Anticipated Outcome(s): supervision ambulatory PT Recommendation Recommendations for Other Services: Neuropsych consult Follow Up Recommendations: Outpatient PT Patient destination: Home Equipment Recommended: To be determined Equipment Details: owns RW, cane, WC   PT Evaluation Precautions/Restrictions Precautions Precautions: Fall Recall of Precautions/Restrictions: Intact Restrictions Weight Bearing Restrictions Per Provider Order: No Pain Interference Pain Interference Pain Effect on Sleep: 1. Rarely or not at all Pain Interference with Therapy Activities: 1. Rarely or not at all Pain Interference with Day-to-Day Activities: 1. Rarely or not at all Home Living/Prior Functioning Home Living Available Help at Discharge: Available 24  hours/day;Family Type of Home: House Home Access: Stairs to enter [employer] of Steps: 7-8 Entrance Stairs-Rails: Right;Left (can only reach one at a time) Home Layout: Able to live on main level with bedroom/bathroom Alternate Level Stairs-Number of Steps: full flight of stairs from driveway - main entry with one handrail and brick wall that he holds onto on other side Alternate Level Stairs-Rails: Left Bathroom Shower/Tub: Health Visitor: Standard Bathroom Accessibility: Yes  Lives With: Significant other Prior Function Level of Independence: Independent with basic ADLs;Independent with gait;Independent with homemaking with ambulation  Able to Take Stairs?: Yes Driving: Yes Vocation: Retired It Consultant Overall Cognitive Status: Impaired/Different from baseline Arousal/Alertness: Awake/alert Orientation Level: Oriented to person;Oriented to place;Oriented to time;Disoriented to situation Year: 2025 Month: December Day of Week: Correct Attention: Focused;Sustained Focused Attention: Appears intact Sustained Attention: Appears intact Memory: Appears intact Awareness: Impaired Awareness Impairment: Emergent impairment Problem Solving: Impaired Problem Solving Impairment: Verbal complex Safety/Judgment: Appears intact Sensation Sensation Light Touch: Impaired by gross assessment Hot/Cold: Not tested Proprioception: Impaired by gross assessment Stereognosis: Not tested Additional Comments: reports numbness in R side of face and RUE Coordination Gross Motor Movements are Fluid and Coordinated: No Fine Motor Movements are Fluid and Coordinated: No Coordination and Movement Description: decreased coordination especially in LE Motor  Motor Motor: Ataxia;Motor apraxia  Trunk/Postural Assessment  Cervical Assessment Cervical Assessment: Within Functional Limits Thoracic Assessment Thoracic Assessment: Within Functional Limits Lumbar  Assessment Lumbar Assessment: Within Functional Limits Postural Control Postural Control: Deficits on evaluation  Balance Balance Balance Assessed: Yes Standardized Balance Assessment Standardized Balance Assessment: Timed Up and Go Test Timed Up and Go Test TUG: Normal TUG Normal TUG (seconds): 27.62 Static Standing Balance Static Standing - Balance Support: Bilateral upper extremity supported;During functional activity Static Standing - Level of Assistance: 4: Min assist Dynamic Standing Balance Dynamic Standing - Balance Support: Bilateral upper extremity supported;During functional activity Dynamic Standing - Level of Assistance: 4: Min assist Extremity Assessment  RLE Assessment RLE Assessment: Within Functional Limits General Strength Comments: grossly 5/5 LLE Assessment LLE Assessment:  Within Functional Limits General Strength Comments: grossly 5/5  Care Tool Care Tool Bed Mobility Roll left and right activity   Roll left and right assist level: Supervision/Verbal cueing    Sit to lying activity   Sit to lying assist level: Supervision/Verbal cueing    Lying to sitting on side of bed activity   Lying to sitting on side of bed assist level: the ability to move from lying on the back to sitting on the side of the bed with no back support.: Supervision/Verbal cueing     Care Tool Transfers Sit to stand transfer   Sit to stand assist level: Moderate Assistance - Patient 50 - 74%    Chair/bed transfer   Chair/bed transfer assist level: Moderate Assistance - Patient 50 - 74%    Car transfer   Car transfer assist level: Moderate Assistance - Patient 50 - 74%      Care Tool Locomotion Ambulation   Assist level: Moderate Assistance - Patient 50 - 74% Assistive device: Walker-rolling Max distance: 100'  Walk 10 feet activity   Assist level: Moderate Assistance - Patient - 50 - 74% Assistive device: Walker-rolling   Walk 50 feet with 2 turns activity   Assist  level: Moderate Assistance - Patient - 50 - 74% Assistive device: Walker-rolling  Walk 150 feet activity Walk 150 feet activity did not occur: Safety/medical concerns      Walk 10 feet on uneven surfaces activity Walk 10 feet on uneven surfaces activity did not occur: Safety/medical concerns      Stairs   Assist level: Moderate Assistance - Patient - 50 - 74% Stairs assistive device: 2 hand rails Max number of stairs: 4  Walk up/down 1 step activity   Walk up/down 1 step (curb) assist level: Moderate Assistance - Patient - 50 - 74% Walk up/down 1 step or curb assistive device: 2 hand rails  Walk up/down 4 steps activity   Walk up/down 4 steps assist level: Moderate Assistance - Patient - 50 - 74% Walk up/down 4 steps assistive device: 2 hand rails  Walk up/down 12 steps activity Walk up/down 12 steps activity did not occur: Safety/medical concerns      Pick up small objects from floor Pick up small object from the floor (from standing position) activity did not occur: Safety/medical concerns      Wheelchair Is the patient using a wheelchair?: Yes Type of Wheelchair: Manual   Wheelchair assist level: Minimal Assistance - Patient > 75% Max wheelchair distance: 125'  Wheel 50 feet with 2 turns activity   Assist Level: Minimal Assistance - Patient > 75%  Wheel 150 feet activity   Assist Level: Minimal Assistance - Patient > 75%    Refer to Care Plan for Long Term Goals  SHORT TERM GOAL WEEK 1 PT Short Term Goal 1 (Week 1): Pt will complete transfers CGA consistently PT Short Term Goal 2 (Week 1): Pt will ambulate 150' with LRAD with CGA PT Short Term Goal 3 (Week 1): Pt will complete up/down 12 steps with BHRs with CGA  Recommendations for other services: Neuropsych  Skilled Therapeutic Intervention Evaluation completed (see details above and below) with education on PT POC and goals and individual treatment initiated with focus on gait training and therapeutic activities to  facilitate improved safety and independence with transfers and sitting balance. Pt completes bed mobility with supervision. Pt self propels WC ~125' with min assist with R path deviation. Pt positioned in //bars and completes sit to stand  with min assist. Pt ambulates in //bars with min assist and same person WC follow. Pt completes sit<>stand with RW with min assist, requires cues for BUE hand placement and midline positioning in standing with BUE support on RW. Pt ambulates with RW min/mod assist 58' with mirror for midline positioning initially, pt demonstrating NBOS with RLE positioning to L of midline with cues provided to correct. Pt ambulates 100' with RW with min/mod assist with similar deviations as above. Pt completes up/down 4 steps with BHRs with min/mod assist and cues for foot positioning with pt demonstrating poor foot placement of RLE close to edge of step while descending with LLE. Pt completes car transfer with min/mod assist due to R lateral lean with RW. Pt returns to room and remains seated in [facility] with all needs within reach, cal light in place and bed alarm activated at end of session.   Mobility Bed Mobility Bed Mobility: Supine to Sit;Sit to Supine Supine to Sit: Supervision/Verbal cueing Sit to Supine: Supervision/Verbal cueing Transfers Transfers: Sit to Stand;Stand to Sit;Stand Pivot Transfers;Transfer Sit to Stand: Moderate Assistance - Patient 50-74% Stand to Sit: Moderate Assistance - Patient 50-74% Stand Pivot Transfers: Moderate Assistance - Patient 50 - 74% Stand Pivot Transfer Details: Verbal cues for precautions/safety;Verbal cues for technique;Tactile cues for weight shifting;Verbal cues for safe use of DME/AE;Manual facilitation for weight shifting Transfer (Assistive device): Rolling walker Locomotion  Gait Ambulation: Yes Gait Assistance: Moderate Assistance - Patient 50-74% Gait Distance (Feet): 100 Feet Assistive device: Rolling walker Gait Assistance  Details: Verbal cues for safe use of DME/AE;Verbal cues for gait pattern;Verbal cues for precautions/safety;Verbal cues for technique;Tactile cues for weight shifting Gait Gait: Yes Gait Pattern: Impaired Gait Pattern: Right flexed knee in stance;Lateral trunk lean to right;Poor foot clearance - right;Poor foot clearance - left;Narrow base of support Gait velocity: decreased Stairs / Additional Locomotion Stairs: Yes Stairs Assistance: Moderate Assistance - Patient 50 - 74% Stair Management Technique: Two rails Number of Stairs: 4 Height of Stairs: 6 Wheelchair Mobility Wheelchair Mobility: Yes Wheelchair Assistance: Minimal assistance - Patient >75% Wheelchair Propulsion: Both upper extremities Wheelchair Parts Management: Needs assistance Distance: 125'   Discharge Criteria: Patient will be discharged from PT if patient refuses treatment 3 consecutive times without medical reason, if treatment goals not met, if there is a change in medical status, if patient makes no progress towards goals or if patient is discharged from hospital.  The above assessment, treatment plan, treatment alternatives and goals were discussed and mutually agreed upon: by patient  Reche Ohara PT, DPT 09/21/2024, 2:23 PM

## 2024-09-21 NOTE — Progress Notes (Signed)
 Inpatient Rehabilitation  Patient information reviewed and entered into eRehab system by Feliberto Gottron, M.A., CCC-SLP, Rehab Quality Coordinator.  Information including medical coding, functional ability and quality indicators will be reviewed and updated through discharge.

## 2024-09-21 NOTE — Plan of Care (Signed)
" °  Problem: RH Swallowing Goal: LTG Patient will consume least restrictive diet using compensatory strategies with assistance (SLP) Description: LTG:  Patient will consume least restrictive diet using compensatory strategies with assistance (SLP) Flowsheets (Taken 09/21/2024 1049) LTG: Pt Patient will consume least restrictive diet using compensatory strategies with assistance of (SLP): Modified Independent   Problem: RH Problem Solving Goal: LTG Patient will demonstrate problem solving for (SLP) Description: LTG:  Patient will demonstrate problem solving for basic/complex daily situations with cues  (SLP) Flowsheets (Taken 09/21/2024 1049) LTG: Patient will demonstrate problem solving for (SLP): (mildly complex) Other (comment) LTG Patient will demonstrate problem solving for: Supervision   Problem: RH Awareness Goal: LTG: Patient will demonstrate awareness during functional activites type of (SLP) Description: LTG: Patient will demonstrate awareness during functional activites type of (SLP) Flowsheets (Taken 09/21/2024 1049) LTG: Patient will demonstrate awareness during cognitive/linguistic activities with assistance of (SLP): Supervision   "

## 2024-09-21 NOTE — Evaluation (Signed)
 Speech Language Pathology Assessment and Plan  Patient Details  Name: Justin Hurst MRN: [id number] Date of Birth: 1956-09-01  SLP Diagnosis: Cognitive Impairments;Dysphagia  Rehab Potential: Excellent ELOS: 12-14 days    Today's Date: 09/21/2024 SLP Individual Time: 1000-1057 SLP Individual Time Calculation (min): 57 min   Hospital Problem: Principal Problem:   Stroke (cerebrum) [facility])  Past Medical History:  Past Medical History:  Diagnosis Date   Arthritis    Brain cancer (HCC)    Cancer (HCC)    hx of brain cancer- tx in 2023   Dyspnea    GERD (gastroesophageal reflux disease)    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 Ernie Left Knee 03-09-18    KNEE ARTHROPLASTY     x2    KNEE ARTHROSCOPY     Left x3   ORIF FEMUR FRACTURE Left 03/09/2018   Procedure: Left knee femoral revision with open reduction internal fixation of distal femur periprosthetic fracture;  Surgeon: Ernie Cough, MD;  Location: [facility];  Service: Orthopedics;  Laterality: Left;  Adductor Block   ORIF FEMUR FRACTURE Left 06/26/2023   Procedure: OPEN REDUCTION INTERNAL FIXATION (ORIF) DISTAL FEMUR FRACTURE;  Surgeon: Celena Sharper, MD;  Location: [facility] OR;  Service: Orthopedics;  Laterality: Left;   SHOULDER ARTHROSCOPY     Left   TOTAL KNEE ARTHROPLASTY Right 02/24/2024   Procedure: ARTHROPLASTY, KNEE, TOTAL;  Surgeon: Ernie Cough, MD;  Location: [facility];  Service: Orthopedics;  Laterality: Right;   TUMOR EXCISION  06/15/2022   brain    Assessment / Plan / Recommendation Clinical Impression HPI:  Pt is a 68 y/o male with PMH of 3 TKA on the LLE (periprosthetic fractures), B ankle fusions, chronic pain, and clival chordoma completed radiation therapy in 08/2022 and not currently under treatment who presented to [facility]  on 12/15 with progressive decline in mobility and AMS over a few weeks.  Started on decadron  as an outpatient without improvement.  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  Consults to neurology.  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  MRI showed lateral medullary stroke.  Neurology recommended secondary prevention measures and pt will continue aspirin  daily.  Oncology consulted given concern for worsening radiation necrosis and recommended decadron  burst and taper, which pt is tolerating well.  Further follow up/treatment recommendations to occur as an outpatient ~4 week from d/c.  He is tolerating a regular diet.  Therapy ongoing and pt was recommended for CIR.   Clinical Impression:  Bedside Swallow Evaluation: A bedside swallow evaluation was completed to assess for s/sx of oropharyngeal dysphagia. Oral mechanism exam WFL. POs administered included thin liquids, purees and solids. Patient with timely mastication and complete oral clearance. Intermittent throat clears present before, after and during all PO intake which may be due to bolus misdirection vs baseline. (Patient reports he has a hx of throat clearing at b/l). Of note, MBS completed at [facility] reporting  patient with reduced base of tongue retraction, reduced pharyngeal constriction, delayed swallow initiation. This resulted in penetration of thin liquids that cleared  with completion of the swallow for most trials. No aspiration observed.  Recommend regular/thin diet per MBS results with use of standardized precautions including sitting upright during PO, taking small bites/sips at a slow rate and initiation of pharyngeal strengthening exercises to target deficits observed. Recommend intermittent supervision during mealtimes.   Cognitive-Linguistic: Patient was evaluated via the Cognistat to assess cognitive linguistic functioning. Patient oriented to self, location, and time, however initially disoriented to situation (stroke). Patient aware of changes in swallowing and reports some changes in memory. Per standardized assessment, patient with functional attention, memory and expressive/receptive language. Patient with mild deficits in problem solving. Patient would benefit from skilled ST targeting mildly complex problem solving and awareness of current medical situation.  Pt would benefit from skilled ST services to maximize dysphagia and cognition in order to maximize functional independence at d/c. Anticipate patient will require supervision at d/c and TBD f/u SLP services.    Skilled Therapeutic Interventions          Patient evaluated using a standardized cognitive linguistic assessment and bedside swallow evaluation to assess current cognitive, communicative and swallowing function. See above for details.    SLP Assessment  Patient will need skilled Speech Lanaguage Pathology Services during CIR admission    Recommendations  SLP Diet Recommendations: Age appropriate regular solids;Thin Liquid Administration via: Cup;Straw Medication Administration: Whole meds with liquid Supervision: Patient able to self feed Compensations: Slow rate;Small sips/bites Postural Changes and/or Swallow Maneuvers: Seated upright 90 degrees Oral Care Recommendations: Oral care BID Patient destination: Home Follow up Recommendations:  (TBD) Equipment Recommended: None recommended by SLP    SLP Frequency 1 to 3 out of 7 days   SLP Duration  SLP Intensity  SLP Treatment/Interventions 12-14 days  Minumum of 1-2 x/day, 30 to 90 minutes  Cognitive remediation/compensation;Internal/external aids;Cueing hierarchy;Dysphagia/aspiration precaution training;Functional tasks    Pain Denies  SLP Evaluation Cognition Overall  Cognitive Status: Impaired/Different from baseline Arousal/Alertness: Awake/alert Orientation Level: Oriented to person;Oriented to place;Oriented to time;Disoriented to situation Year: 2025 Month: December Day of Week: Correct Attention: Focused;Sustained Focused Attention: Appears intact Sustained Attention: Appears intact Memory: Appears intact Awareness: Impaired Awareness Impairment: Emergent impairment Problem Solving: Impaired Problem Solving Impairment: Verbal complex Safety/Judgment: Appears intact  Comprehension Auditory Comprehension Overall Auditory Comprehension: Appears within functional limits for tasks assessed Yes/No Questions: Within Functional Limits Commands: Within Functional Limits Expression Expression Primary Mode of Expression: Verbal Verbal Expression Overall Verbal Expression: Appears within functional limits for tasks assessed Written Expression Dominant Hand: Left Oral Motor Oral Motor/Sensory Function Overall Oral Motor/Sensory Function: Within functional limits Motor Speech Overall Motor Speech: Appears within functional limits for tasks assessed  Care Tool Care Tool Cognition Ability to hear (with hearing aid or hearing appliances if normally used Ability to hear (with hearing aid or hearing appliances if normally used): 0. Adequate - no difficulty in normal conservation, social interaction, listening to TV   Expression of Ideas and Wants Expression of Ideas and Wants: 4. Without difficulty (complex and basic) - expresses complex messages without difficulty and with speech that is clear and easy to understand   Understanding Verbal and Non-Verbal Content Understanding Verbal and Non-Verbal Content: 4. Understands (complex and basic) - clear comprehension without cues or repetitions  Memory/Recall Ability Memory/Recall Ability : Current season;Staff names and faces;That he or she is in a hospital/hospital unit    Bedside Swallowing  Assessment General Previous Swallow Assessment: MBS 12/19 Diet Prior to this Study: Regular;Thin liquids (Level 0) Respiratory Status: Room air Behavior/Cognition:  Alert;Cooperative;Pleasant mood Oral Cavity - Dentition: Adequate natural dentition Self-Feeding Abilities: Able to feed self Patient Positioning: Upright in bed Baseline Vocal Quality: Normal Volitional Cough: Strong Volitional Swallow: Able to elicit  Ice Chips Ice chips: Not tested Thin Liquid Thin Liquid: Impaired Presentation: Cup;Straw Pharyngeal  Phase Impairments: Throat Clearing - Immediate;Throat Clearing - Delayed Nectar Thick Nectar Thick Liquid: Not tested Honey Thick Honey Thick Liquid: Not tested Puree Puree: Impaired Presentation: Self Fed Pharyngeal Phase Impairments: Throat Clearing - Immediate;Throat Clearing - Delayed Solid Solid: Impaired Presentation: Self Fed Pharyngeal Phase Impairments: Throat Clearing - Immediate;Throat Clearing - Delayed BSE Assessment Risk for Aspiration Impact on safety and function: Moderate aspiration risk Other Related Risk Factors:  (location of CVA)  Short Term Goals: Week 1: SLP Short Term Goal 1 (Week 1): Patient will complete pharyngeal strengthening exercises given supervision verbal A SLP Short Term Goal 2 (Week 1): Patient will demonstrate awareness of current medical situation given min verbal A SLP Short Term Goal 3 (Week 1): Patient will tolerate current diet with use of compensatory strategies given supervision A SLP Short Term Goal 4 (Week 1): Patient will complete functional problem solving tasks in daily situations given min verbal A  Refer to Care Plan for Long Term Goals  Recommendations for other services: None   Discharge Criteria: Patient will be discharged from SLP if patient refuses treatment 3 consecutive times without medical reason, if treatment goals not met, if there is a change in medical status, if patient makes no progress towards  goals or if patient is discharged from hospital.  The above assessment, treatment plan, treatment alternatives and goals were discussed and mutually agreed upon: by patient  Danicia Terhaar M.A., CCC-SLP 09/21/2024, 10:48 AM

## 2024-09-21 NOTE — Plan of Care (Signed)
" °  Problem: RH Balance Goal: LTG Patient will maintain dynamic sitting balance (PT) Description: LTG:  Patient will maintain dynamic sitting balance with assistance during mobility activities (PT) Flowsheets (Taken 09/21/2024 1610) LTG: Pt will maintain dynamic sitting balance during mobility activities with:: Independent Goal: LTG Patient will maintain dynamic standing balance (PT) Description: LTG:  Patient will maintain dynamic standing balance with assistance during mobility activities (PT) Flowsheets (Taken 09/21/2024 1610) LTG: Pt will maintain dynamic standing balance during mobility activities with:: Supervision/Verbal cueing   Problem: Sit to Stand Goal: LTG:  Patient will perform sit to stand with assistance level (PT) Description: LTG:  Patient will perform sit to stand with assistance level (PT) Flowsheets (Taken 09/21/2024 1610) LTG: PT will perform sit to stand in preparation for functional mobility with assistance level: Supervision/Verbal cueing   Problem: RH Bed Mobility Goal: LTG Patient will perform bed mobility with assist (PT) Description: LTG: Patient will perform bed mobility with assistance, with/without cues (PT). Flowsheets (Taken 09/21/2024 1610) LTG: Pt will perform bed mobility with assistance level of: Independent   Problem: RH Bed to Chair Transfers Goal: LTG Patient will perform bed/chair transfers w/assist (PT) Description: LTG: Patient will perform bed to chair transfers with assistance (PT). Flowsheets (Taken 09/21/2024 1610) LTG: Pt will perform Bed to Chair Transfers with assistance level: Supervision/Verbal cueing   Problem: RH Car Transfers Goal: LTG Patient will perform car transfers with assist (PT) Description: LTG: Patient will perform car transfers with assistance (PT). Flowsheets (Taken 09/21/2024 1610) LTG: Pt will perform car transfers with assist:: Supervision/Verbal cueing   Problem: RH Ambulation Goal: LTG Patient will ambulate in  controlled environment (PT) Description: LTG: Patient will ambulate in a controlled environment, # of feet with assistance (PT). Flowsheets (Taken 09/21/2024 1610) LTG: Pt will ambulate in controlled environ  assist needed:: Supervision/Verbal cueing LTG: Ambulation distance in controlled environment: 150' Goal: LTG Patient will ambulate in home environment (PT) Description: LTG: Patient will ambulate in home environment, # of feet with assistance (PT). Flowsheets (Taken 09/21/2024 1610) LTG: Pt will ambulate in home environ  assist needed:: Supervision/Verbal cueing LTG: Ambulation distance in home environment: 50'   Problem: RH Stairs Goal: LTG Patient will ambulate up and down stairs w/assist (PT) Description: LTG: Patient will ambulate up and down # of stairs with assistance (PT) Flowsheets (Taken 09/21/2024 1610) LTG: Pt will ambulate up/down stairs assist needed:: Supervision/Verbal cueing LTG: Pt will  ambulate up and down number of stairs: 14 days per home set up   "

## 2024-09-21 NOTE — Discharge Instructions (Addendum)
 Inpatient Rehab Discharge Instructions  Justin Hurst Discharge date and time:  10/07/24  Activities/Precautions/ Functional Status: Activity: no lifting, driving, or strenuous exercise till cleared by MD Diet: regular diet Wound Care: none needed   Functional status:  ___ No restrictions     ___ Walk up steps independently _X__ 24/7 supervision/assistance   ___ Walk up steps with assistance ___ Intermittent supervision/assistance  ___ Bathe/dress independently ___ Walk with walker     ___ Bathe/dress with assistance ___ Walk Independently    ___ Shower independently ___ Walk with assistance    _X__ Shower with assistance _X__ No alcohol      ___ Return to work/school ________  Special Instructions:    COMMUNITY REFERRALS UPON DISCHARGE:     Outpatient: PT             Agency:[facility] [address] [place] [address] Phone:[phone]              Appointment Date/Time: WILL CALL TO SET UP FOLLOW UP APPOINTMENT  Medical Equipment/Items Ordered:WHEELCHAIR                                                 Agency/Supplier:[facility]   [phone]     My questions have been answered and I understand these instructions. I will adhere to these goals and the provided educational materials after my discharge from the hospital.  Patient/Caregiver Signature _______________________________ Date __________  Clinician Signature _______________________________________ Date __________  Please bring this form and your medication list with you to all your follow-up doctor's appointments.

## 2024-09-22 DIAGNOSIS — G464 Cerebellar stroke syndrome: Secondary | ICD-10-CM | POA: Diagnosis not present

## 2024-09-22 DIAGNOSIS — I6789 Other cerebrovascular disease: Secondary | ICD-10-CM | POA: Diagnosis not present

## 2024-09-22 DIAGNOSIS — Y842 Radiological procedure and radiotherapy as the cause of abnormal reaction of the patient, or of later complication, without mention of misadventure at the time of the procedure: Secondary | ICD-10-CM | POA: Diagnosis not present

## 2024-09-22 DIAGNOSIS — I63011 Cerebral infarction due to thrombosis of right vertebral artery: Secondary | ICD-10-CM | POA: Diagnosis not present

## 2024-09-22 NOTE — Progress Notes (Signed)
 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 Ernie Left Knee 03-09-18    KNEE ARTHROPLASTY     x2    KNEE ARTHROSCOPY     Left x3   ORIF FEMUR FRACTURE Left 03/09/2018   Procedure: Left knee femoral revision with open reduction internal fixation of distal femur periprosthetic fracture;  Surgeon: Ernie Cough, MD;  Location: [facility];  Service: Orthopedics;  Laterality: Left;  Adductor Block   ORIF FEMUR FRACTURE Left 06/26/2023   Procedure: OPEN REDUCTION INTERNAL FIXATION (ORIF) DISTAL FEMUR FRACTURE;  Surgeon: Celena Sharper, MD;  Location: [facility] OR;  Service: Orthopedics;  Laterality: Left;   SHOULDER ARTHROSCOPY     Left   TOTAL KNEE ARTHROPLASTY Right 02/24/2024   Procedure: ARTHROPLASTY, KNEE, TOTAL;  Surgeon: Ernie Cough, MD;  Location: [facility];  Service: Orthopedics;  Laterality: Right;   TUMOR EXCISION  06/15/2022   brain   Social History:  reports that he has never smoked. He has never used smokeless tobacco. He reports current alcohol  use of about 10.0 standard drinks of alcohol  per week. He reports that he does not use drugs.  Family / Support Systems Marital Status: Married How Long?: 42 years Patient Roles: Spouse, Parent Spouse/Significant Other: Arland (Wife) [phone] Children: DtrGLENWOOD Moats (PRN support lives in [place]) Other Supports:  N/A Anticipated Caregiver: Wife Ability/Limitations of Caregiver: Pt will be intermittent supervison at d/c since his wife works 3hrs per day (PT 15hrs per week). Caregiver Availability: Intermittent Family Dynamics: Pt lives with his wife  Social History Preferred language: English Religion:  Cultural Background: Pt reports he worked at [employer] until retirement in 2019. He is not a cytogeneticist. Education: some Charity Fundraiser - How often do you need to have someone help you when you read instructions, pamphlets, or other written material from your doctor or pharmacy?: Never Writes: Yes Employment Status: Retired Date Retired/Disabled/Unemployed: 2019 Marine Scientist Issues: Denies Guardian/Conservator: PRODUCT MANAGER- wife Arland; no forms on file   Abuse/Neglect Abuse/Neglect Assessment Can Be Completed: Yes Physical Abuse: Denies Verbal Abuse: Denies Sexual Abuse: Denies Exploitation of patient/patient's resources: Denies Self-Neglect: Denies  Patient response to: Social Isolation - How often do you feel lonely or isolated from those around you?: Never  Emotional Status Pt's affect, behavior and adjustment status: Pt in good spirits at time of visit Recent Psychosocial Issues: Denies Psychiatric History: Dneies Substance Abuse History: Pt reports he is a social drinker denies tobacco products or rec drug use  Patient / Family Perceptions, Expectations & Goals Pt/Family understanding of illness & functional limitations: Pt has general understanding of care needs Premorbid pt/family roles/activities: Independent Anticipated changes in roles/activities/participation: Assistance with ADLs/IADLs Pt/family expectations/goals: Pt goal is to work on getting his gait back.  Community Resources [employer]: None Premorbid Home Care/DME Agencies: None Transportation available at discharge: Wife Is the patient able to respond to transportation needs?: Yes In the  past 12 months, has lack of transportation kept you from medical appointments or from getting medications?: No In the past 12 months, has lack of transportation kept you from meetings, work, or from getting things needed for daily living?: No Resource referrals recommended: Neuropsychology  Discharge Planning Living Arrangements: Spouse/significant other Support Systems: Spouse/significant other, Children Type of Residence: Private residence Insurance Resources: Media Planner (specify) ([employer] Medicare) Surveyor, Quantity Resources: Restaurant Manager, Fast Food Screen Referred: No Living Expenses: Banker Management: Spouse Does the patient have any problems obtaining your medications?: No Home Management: Pt and wife manage homecare needs Patient/Family Preliminary Plans: Wife Care Coordinator Barriers to Discharge: Decreased caregiver support, Lack of/limited family support, Insurance for SNF coverage Care Coordinator Anticipated Follow Up Needs: HH/OP  Clinical Impression SW covering for primary SW, Becky Dupree.   SW introduced self, explain role, and discharge process. Pt is not a cytogeneticist. Pt DME: RW, cane, w/c, and treadmill, and stationary bike. Aware SW will follow-up with his wife.  Abbiegail Landgren A Avary Pitsenbarger 09/22/2024, 10:24 AM    "

## 2024-09-22 NOTE — Progress Notes (Signed)
 Patient ID: Justin Hurst, male   DOB: Dec 25, 1955, 68 y.o.   MRN: [id number]  SW met with pt in room to provide updates from team conference, and d/c date 1/16. SW   1357- SW spoke with [name] to provide updates from team conference, and d/c date 1/16. She reports that she still working PT as agricultural consultant. Bathroom is handicap accessible,and bedroom is open if he needs a RW. States upstairs there are some door thresholds. Upstairs is the primary level; states downstairs is remodeled and fully accessible and fully furnished and they can live downstairs if needed. Reports the following: Back door has 4STE no railing and can install Side porch with steady railing and brick but 14 STE Front door y-10 steps but bilateral railings and unable to touch at same time; unsteady walkway. Has a lift chair and thinks this has not been helpful for him as he was more sedentary. Wife reports he was not very active when he was at home and concerned if this will be an issue. SW informed there will be more therapy when he leaves, and there will also be family education closer towards discharge. She is aware Rhoda will follow-up with more updates.   Graeme Jude, MSW, LCSW Office: [phone] Cell: [phone] Fax: [phone]

## 2024-09-22 NOTE — Care Management (Signed)
 Inpatient Rehabilitation Center Individual Statement of Services  Patient Name:  Justin Hurst  Date:  09/22/2024  Welcome to the Inpatient Rehabilitation Center.  Our goal is to provide you with an individualized program based on your diagnosis and situation, designed to meet your specific needs.  With this comprehensive rehabilitation program, you will be expected to participate in at least 3 hours of rehabilitation therapies Monday-Friday, with modified therapy programming on the weekends.  Your rehabilitation program will include the following services:  Physical Therapy (PT), Occupational Therapy (OT), Speech Therapy (ST), 24 hour per day rehabilitation nursing, Therapeutic Recreaction (TR), Psychology, Neuropsychology, Care Coordinator, Rehabilitation Medicine, Nutrition Services, Pharmacy Services, and Other  Weekly team conferences will be held on Wednesday to discuss your progress.  Your Inpatient Rehabilitation Care Coordinator will talk with you frequently to get your input and to update you on team discussions.  Team conferences with you and your family in attendance may also be held.  Expected length of stay: 14-18 days    Overall anticipated outcome: Supervision  Depending on your progress and recovery, your program may change. Your Inpatient Rehabilitation Care Coordinator will coordinate services and will keep you informed of any changes. Your Inpatient Rehabilitation Care Coordinator's name and contact numbers are listed  below.  The following services may also be recommended but are not provided by the Inpatient Rehabilitation Center:  Driving Evaluations Home Health Rehabiltiation Services Outpatient Rehabilitation Services Vocational Rehabilitation   Arrangements will be made to provide these services after discharge if needed.  Arrangements include referral to agencies that provide these services.  Your insurance has been verified to be:  [facility] Medicare  Your primary  doctor is:  Prentice Batch  Pertinent information will be shared with your doctor and your insurance company.  Inpatient Rehabilitation Care Coordinator:  Rhoda Clement, KEN [phone] or [phone]  Information discussed with and copy given to patient by: Graeme DELENA Jude, 09/22/2024, 10:16 AM

## 2024-09-22 NOTE — Plan of Care (Signed)
" °  Problem: Consults Goal: RH STROKE PATIENT EDUCATION Description: See Patient Education module for education specifics  Outcome: Progressing   Problem: RH BOWEL ELIMINATION Goal: RH STG MANAGE BOWEL WITH ASSISTANCE Description: STG Manage Bowel with mod I Assistance. Outcome: Progressing Goal: RH STG MANAGE BOWEL W/MEDICATION W/ASSISTANCE Description: STG Manage Bowel with Medication with mod I Assistance. Outcome: Progressing   Problem: RH BLADDER ELIMINATION Goal: RH STG MANAGE BLADDER WITH ASSISTANCE Description: STG Manage Bladder With  toileting Assistance Outcome: Progressing   Problem: RH SAFETY Goal: RH STG ADHERE TO SAFETY PRECAUTIONS W/ASSISTANCE/DEVICE Description: STG Adhere to Safety Precautions With cues Assistance/Device. Outcome: Progressing   Problem: RH KNOWLEDGE DEFICIT Goal: RH STG INCREASE KNOWLEDGE OF STROKE PROPHYLAXIS Description: Patient and wife will be able to manage secondary risk/preventative care using educational resources for medications and dietary modification independently Outcome: Progressing   "

## 2024-09-22 NOTE — Progress Notes (Signed)
 Physical Therapy Session Note  Patient Details  Name: Justin Hurst MRN: [id number] Date of Birth: 24-Oct-1955  Today's Date: 09/22/2024 PT Individual Time: 0925-1000; 1116 - 1156; 1305 - 1408 PT Individual Time Calculation (min): 35 min; 40 mi; 63 min   Short Term Goals: Week 1:  PT Short Term Goal 1 (Week 1): Pt will complete transfers CGA consistently PT Short Term Goal 2 (Week 1): Pt will ambulate 150' with LRAD with CGA PT Short Term Goal 3 (Week 1): Pt will complete up/down 12 steps with BHRs with CGA  SESSION 1 Skilled Therapeutic Interventions/Progress Updates: Patient sitting in [facility] following rounds with attending MD on entrance to room. Patient alert and agreeable to PT session.   Patient reported no pain.  Therapeutic Activity: Transfers: Pt performed sit<>stand transfers throughout session with CGA to stand and modA to maintain standing balance due to significant R lean. Provided VC for hand placement.  Five times Sit to Stand Test (FTSS) Method: Use a straight back chair with a solid seat that is 16-18 high. Ask participant to sit on the chair with arms folded across their chest.   Instructions: Stand up and sit down as quickly as possible 5 times, keeping your arms folded across your chest.   Measurement: Stop timing when the participant stands the 5th time.   TIME: _33.14_ (in seconds) - 42.23s - 33.26s - 23.94s   Times > 13.6 seconds is associated with increased disability and morbidity (Guralnik, 2000) Times > 15 seconds is predictive of recurrent falls in healthy individuals aged 81 and older (Buatois, et al., 2008) Normal performance values in community dwelling individuals aged 90 and older (Bohannon, 2006): 60-69 years: 11.4 seconds 70-79 years: 12.6 seconds 80-89 years: 14.8 seconds   MCID: >= 2.3 seconds for Vestibular Disorders (Meretta, 2006)  Neuromuscular Re-ed: - Static standing balance in RW with mirror feedback. Pt with significant lean  to R with pt self aware of deficit but unable to correct without assistance. Pt cued to shift weight to l but only leans trunk towards PTA. PTA provided tactile feedback for pt to shift hips to L with some correction noted to occur but pt would quickly lean back to R with R knee flexed and L knee maintained in extension.   NMR performed for improvements in motor control and coordination, balance, sequencing, judgement, and self confidence/ efficacy in performing all aspects of mobility at highest level of independence.   Patient sitting in WC at end of session with brakes locked, belt alarm set, and all needs within reach.  SESSION 2 Skilled Therapeutic Interventions/Progress Updates: Patient sitting in WC on entrance to room. Patient alert and agreeable to PT session.   Patient reported no pain  Therapeutic Activity: Transfers: Pt performed sit<>stand transfers throughout session with CGA in preparation for NMRE. VC for controlled descent.  Neuromuscular Re-ed: - weight shifting in // bar. Pt initially facing mirror with tidal tank in R UE to augment pt's R lean error. Pt unable to safely correct without maxA to prevent R LOB. Pt noted to maintain L LE in knee extension that is potentially increasing pt' R lean. Pt required seated rest. Pt then with B UE on // bar and cued to weight shift to R and L (emphasis on L) while maintaining shoulders aligned with hips. Pt also with increased anterior lean that required mod/heavy modA to prevent LOB (cued to press heel into ground). 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 Seated rest break required  - Pt stood in // bar with UE supported and cue to mirror PTA in upright alignment/posture. Pt with weight shifted onto front of foot and R lean but able to correct with PTA allowing pt to fail and to self correct with only cue to mirror PTA. Pt also cued to look into mirror to  correct anterior weight shift with pt doing so better with visual mirror vs verbal cue. Pt weight shifted when needed with hinted cue for pt to correct and overall close supervision (to allow pt to fail) to minA. Seated rest break required. 2 rounds performed.    - 3rd round pt with red theraband pulling into anterior + R lean for pt to correct posture back to center with pt improving ability to self correct with frequent cuing.  NMR performed for improvements in motor control and coordination, balance, sequencing, judgement, and self confidence/ efficacy in performing all aspects of mobility at highest level of independence.   Patient sitting in WC at end of session with brakes locked, belt alarm set, and all needs within reach.  SESSION 3 Skilled Therapeutic Interventions/Progress Updates: Patient sitting in WC on entrance to room. Patient alert and agreeable to PT session.   Patient reported no pain  Therapeutic Activity: Bed Mobility: Pt performed sit<supine from EOB with CGA/supervision. Transfers: Pt performed sit<>stand transfers throughout session with close supervision to stand for interventions, and heavy modA to transfer from WC<EOB at end of session with R HHA due to pt report of increase in fatigue.SABRA  Neuromuscular Re-ed: - continued practice of maintaining weight shift to center in // bar with B UE support and PTA pulling pt with green theraband anterior + R for pt to shift weight posterior and to the L (CGA for safety).  - Static standing balance in // bar with moments of CGA to maintain centered weight shift then maxA to prevent R LOB with same cues provided.  - Pt attempted BITS balance with chest strap with pt requiring increased assistance up to max to prevent R lean. Pt with L LE in extension and when cued to shift weight to L LE, pt would shift shoulders while hips stay towards R despite cuing to keep aligned.  - Pt statically stood with LHR and cue to press L shoulder  against wall for tactile feedback. Pt with decrease in R lean. Pt then ambulated short distance with WC follow and cue to keep L UE touching wall (in RW). Pt presented with decrease in R lean but increase in anterior lean that required maxA to prevent anterior LOB.  - Pt standing in corner with L side and back side touching wall with cues for pt to stay in contact to prevent R and anterior lean. PTA then provided push to R with pt cued to maintain contact. Pt eventually required seated rest with reports of not knowing where the wall and body was.   NMR performed for improvements in motor control and coordination, balance, sequencing, judgement, and self confidence/ efficacy in performing all aspects of mobility at highest level of independence.   Patient supine in bed at end of session with brakes locked, bed alarm set, and all needs within reach.        Therapy Documentation Precautions:  Precautions Precautions: Fall Recall of Precautions/Restrictions: Intact Restrictions Weight Bearing Restrictions Per Provider Order: No  Therapy/Group: Individual Therapy  Meyli Boice A Farrie Sann 09/22/2024, 10:29 AM

## 2024-09-22 NOTE — IPOC Note (Signed)
 Overall Plan of Care [facility]) Patient Details Name: Justin Hurst MRN: [id number] DOB: Nov 24, 1955  Admitting Diagnosis: Stroke (cerebrum) [facility])  Hospital Problems: Principal Problem:   Stroke (cerebrum) [facility])     Functional Problem List: Nursing Bladder, Bowel, Endurance, Medication Management, Safety  PT Balance, Behavior, Endurance, Motor, Perception, Sensory, Safety  OT Balance, Sensory, Vision, Endurance, Motor, Perception, Safety  SLP Nutrition, Cognition  TR         Basic ADLs: OT Bathing, Dressing, Toileting     Advanced  ADLs: OT       Transfers: PT Bed Mobility, Car, Bed to Chair, Occupational Psychologist, Research Scientist (life Sciences): PT Ambulation, Stairs     Additional Impairments: OT Fuctional Use of Upper Extremity  SLP Swallowing, Social Cognition   Problem Solving, Awareness  TR      Anticipated Outcomes Item Anticipated Outcome  Self Feeding mod I  Swallowing  mod i   Basic self-care  SUP  Toileting  SUP   Bathroom Transfers SUP  Bowel/Bladder  manage bowel  with mod I assist and bladder w toiletig  Transfers  supervision  Locomotion  supervision ambulatory  Communication     Cognition  supervision  Pain  n/a  Safety/Judgment  manage safety w cues   Therapy Plan: PT Intensity: Minimum of 1-2 x/day ,45 to 90 minutes PT Frequency: 5 out of 7 days PT Duration Estimated Length of Stay: 14-18 days OT Intensity: Minimum of 1-2 x/day, 45 to 90 minutes OT Frequency: Total of 15 hours over 7 days of combined therapies OT Duration/Estimated Length of Stay: 14-18 days SLP Intensity: Minumum of 1-2 x/day, 30 to 90 minutes SLP Frequency: 1 to 3 out of 7 days SLP Duration/Estimated Length of Stay: 12-14 days   Team Interventions: Nursing Interventions Patient/Family Education, Disease Management/Prevention, Bladder Management, Bowel Management, Medication Management, Discharge Planning  PT interventions Ambulation/gait training, Community  reintegration, DME/adaptive equipment instruction, Neuromuscular re-education, Psychosocial support, UE/LE Strength taining/ROM, Stair training, Warden/ranger, Discharge planning, Therapeutic Activities, UE/LE Coordination activities, Cognitive remediation/compensation, Disease management/prevention, Functional mobility training, Patient/family education, Therapeutic Exercise  OT Interventions Balance/vestibular training, Disease mangement/prevention, Neuromuscular re-education, Therapeutic Exercise, Self Care/advanced ADL retraining, UE/LE Strength taining/ROM, Wheelchair propulsion/positioning, Psychosocial support, Therapeutic Activities, Visual/perceptual remediation/compensation, Functional mobility training, Discharge planning, UE/LE Coordination activities, Patient/family education, Community reintegration, Pain management, DME/adaptive equipment instruction, Cognitive remediation/compensation  SLP Interventions Cognitive remediation/compensation, Internal/external aids, Cueing hierarchy, Dysphagia/aspiration precaution training, Functional tasks  TR Interventions    SW/CM Interventions Discharge Planning, Psychosocial Support, Patient/Family Education   Barriers to Discharge MD  Medical stability  Nursing Home environment access/layout 2 level main B+B, 7/8 ste bil rail w spouse  PT Home environment access/layout, Inaccessible home environment, Decreased caregiver support limited support to provide current assist level, 14 STE with unilateral handrail  OT Decreased caregiver support    SLP      SW Decreased caregiver support, Lack of/limited family support, Community Education Officer for SNF coverage     Team Discharge Planning: Destination: PT-Home ,OT- Home , SLP-Home Projected Follow-up: PT-Outpatient PT, OT-  Home health OT, SLP- (TBD) Projected Equipment Needs: PT-To be determined, OT- 3 in 1 bedside comode, Rolling walker with 5 wheels, Tub/shower seat, SLP-None recommended by  SLP Equipment Details: PT-owns RW, cane, WC, OT-  Patient/family involved in discharge planning: PT- Patient,  OT-Patient, SLP-Patient  MD ELOS: 14-18d Medical Rehab Prognosis:  Good Assessment: The patient has been admitted for CIR therapies with the diagnosis of stroke. The team will  be addressing functional mobility, strength, stamina, balance, safety, adaptive techniques and equipment, self-care, bowel and bladder mgt, patient and caregiver education, balance issues from stroke and prior orthopedic issues. Goals have been set at Mod I/sup. Anticipated discharge destination is home.        See Team Conference Notes for weekly updates to the plan of care

## 2024-09-22 NOTE — Patient Care Conference (Signed)
 Inpatient RehabilitationTeam Conference and Plan of Care Update Date: 09/22/2024   Time: 10:12 AM    Patient Name: Justin Hurst      Medical Record Number: [id number]  Date of Birth: September 07, 1956 Sex: Male         Room/Bed: 4W24C/4W24C-01 Payor Info: Payor: [employer] MEDICARE / Plan: [facility] MEDICARE / Product Type: *No Product type* /    Admit Date/Time:  09/20/2024  1:01 PM  Primary Diagnosis:  Stroke (cerebrum) [facility])  Hospital Problems: Principal Problem:   Stroke (cerebrum) [facility])    Expected Discharge Date: Expected Discharge Date: 10/08/24  Team Members Present: Physician leading conference: Dr. Prentice Compton Social Worker Present: Graeme Jude, LCSW Nurse Present: Barnie Ronde, RN PT Present: Sherlean Perks, PT;Dominic Sandoval, PTA OT Present: Recardo Maxwell, OT SLP Present: Joane Fuss, SLP     Current Status/Progress Goal Weekly Team Focus  Bowel/Bladder   patient continent X2. last BM 12/29   maintain tolilating schedule and regular bowel pattern   meet toilating needs    Swallow/Nutrition/ Hydration   regular/thin   mod i  pharyngeal strengthening exercises per MBS    ADL's   set up UB self care,  mod A toileting and LB self care,  min  squat pivot,  min sit to stand but needs mod A for static balance without UE support   supervision overall   ADL training, postural control/balance, general conditioning,  pt education    Mobility   bed mobility = supervision; transfer = modA; gait = modA (sigificant R lean); modA stairs   mostly supervision  dynamic standing balance    Communication                Safety/Cognition/ Behavioral Observations  mild deficits   supervision   mildly complex problem solving, awareness of medical situation    Pain   no complaint of pain   pain score of 0   assess pain qshift/prn    Skin   no wounds   Remains free of wounds  encourage patient to turn self in the bed and chair       Discharge Planning:  D/c to home with his wife as promary caregiver. Wife works PT, 3hrs per day. Will need intermittent supervision at discarge. Has a dtr that lives in [place] and she can help PRN. SW will confirm there are no barriers to discharge.   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  Patient has trouble with static balance and shifting side to side with a right lean.   Patient on target to meet rehab goals: yes, currently needs mod assist for lower body care and independent with upper body care. Needs mod assist for transfers and max assist to navigate steps.  Mild cognitive deficits noted; working on problem solving and medication management.  Goals for discharge set for supervision - mod I overall.  *See Care Plan and progress notes for long and short-term goals.   Revisions to Treatment Plan:  Pharyngeal exercises   Teaching Needs: Safety, medications, transfers, toileting, etc.   Current Barriers to Discharge: Decreased caregiver support  Possible Resolutions to Barriers: Family education     Medical Summary Current Status: Poor sitting balance, ataxia right upper extremity, blood pressure is controlled, mild increase in BUN  Barriers to Discharge: Medical stability  Possible Resolutions to [employer] Focus: Monitor fluid status, work on balance and stability   Continued Need for Acute Rehabilitation Level of Care: The patient requires daily medical management by a physician with specialized training in physical medicine and rehabilitation for the following reasons: Direction of a multidisciplinary physical rehabilitation program to maximize functional independence : Yes Medical management of patient stability for increased activity during participation  in an intensive rehabilitation regime.: Yes Analysis of laboratory values and/or radiology reports with any subsequent need for medication adjustment and/or medical intervention. : Yes   I attest that I was present, lead the team conference, and concur with the assessment and plan of the team.   Fredericka Sober B 09/22/2024, 1:38 PM

## 2024-09-22 NOTE — Progress Notes (Signed)
 Occupational Therapy Session Note  Patient Details  Name: ARIN PERAL MRN: [id number] Date of Birth: Jul 14, 1956  Today's Date: 09/22/2024 OT Individual Time: 9164-9079 OT Individual Time Calculation (min): 45 min    Short Term Goals: Week 1:  OT Short Term Goal 1 (Week 1): patient will complete toilet transfer mod A with increased body awareness OT Short Term Goal 2 (Week 1): patient will demonstrate ability to complete UB dressing with increased dynamic balance with SUP sitting on EOB OT Short Term Goal 3 (Week 1): patient will complete LB dressing standing to pull pants up with min A with increased standing balance  Skilled Therapeutic Interventions/Progress Updates:    Pt received in bed ready for therapy. Pt stated he had already brushed teeth.  -bed mobility from flat bed without rails =S -sitting EOB with S with slight R lean but pt self corrects -sit to stand from EOB min A but then needs mod A static standing without UE support due  -static stand with 1 hand on grab bar with CGA -squat pivot bed to w/c, wc to toilet back to w/c all with min A and cues for forward weight shift and head hip ratio -pt requested to toilet,   completed transfer to toilet with bar min A and voided (documented in flow sheet),  pt pulled pants down but needed mod A to pull up  and cleanse as he was having difficulty reaching with either hand -removed BSC over toilet to allow for better height and pt held balance well on toilet -completed dressing from toilet for LB using reacher as needed. -sat at sink to wash face and UB and don shirt  Pt in wc with hand off to next therapist.  Therapy Documentation Precautions:  Precautions Precautions: Fall Recall of Precautions/Restrictions: Intact Restrictions Weight Bearing Restrictions Per Provider Order: No       Pain: Pain Assessment Pain Scale: 0-10 Pain Score: 0-No pain ADL: ADL Equipment Provided: Reacher Eating: Set up Where  Assessed-Eating: Bed level Grooming: Supervision/safety Where Assessed-Grooming: Wheelchair, Sitting at sink Upper Body Bathing: Supervision/safety Where Assessed-Upper Body Bathing: Sitting at sink Lower Body Bathing: Moderate assistance Where Assessed-Lower Body Bathing: Standing at sink, Sitting at sink Upper Body Dressing: Supervision/safety Where Assessed-Upper Body Dressing: Wheelchair Lower Body Dressing: Moderate assistance, Maximal assistance Where Assessed-Lower Body Dressing: Standing at sink Toileting: Moderate assistance Where Assessed-Toileting: Teacher, Adult Education: Curator Method: Ambulance Person: Acupuncturist: Unable to assess [employer] Method: Unable to assess   Therapy/Group: Individual Therapy  Shigeko Manard 09/22/2024, 9:35 AM

## 2024-09-22 NOTE — Progress Notes (Signed)
 "                                                        PROGRESS NOTE   Subjective/Complaints:  No issues overnite   ROS- neg CP, SOB, N/V/D  Objective:   No results found. Recent Labs    09/21/24 0431  WBC 10.1  HGB 13.7  HCT 40.1  PLT 266   Recent Labs    09/21/24 0431  NA 138  K 4.3  CL 102  CO2 27  GLUCOSE 92  BUN 24*  CREATININE 0.93  CALCIUM  9.3    Intake/Output Summary (Last 24 hours) at 09/22/2024 0846 Last data filed at 09/21/2024 2308 Gross per 24 hour  Intake 711 ml  Output 1125 ml  Net -414 ml        Physical Exam: Vital Signs Blood pressure 123/83, pulse 61, temperature 97.7 F (36.5 C), resp. rate 18, height 5' 7 (1.702 m), weight 92.6 kg, SpO2 95%.   General: No acute distress Mood and affect are appropriate Heart: Regular rate and rhythm no rubs murmurs or extra sounds Lungs: Clear to auscultation, breathing unlabored, no rales or wheezes Abdomen: Positive bowel sounds, soft nontender to palpation, nondistended Extremities: No clubbing, cyanosis, or edema Skin: No evidence of breakdown, no evidence of rash Neurologic: Cranial nerves II through XII intact, motor strength is 5/5 in bilateral deltoid, bicep, tricep, grip, hip flexor, knee extensors, ankle dorsiflexor and plantar flexor Sensory exam normal sensation to light touch  in bilateral upper and lower extremities Cerebellar exam normal finger to nose to finger as well as heel to shin in bilateral upper and lower extremities Musculoskeletal: ~50% ankle PF/DF, inv/ev   Assessment/Plan: 1. Functional deficits which require 3+ hours per day of interdisciplinary therapy in a comprehensive inpatient rehab setting. Physiatrist is providing close team supervision and 24 hour management of active medical problems listed below. Physiatrist and rehab team continue to assess barriers to discharge/monitor patient progress toward functional and medical goals  Care Tool:  Bathing    Body  parts bathed by patient: Right arm, Left arm, Chest, Abdomen, Front perineal area, Buttocks, Right upper leg, Left upper leg, Right lower leg, Face, Left lower leg   Body parts bathed by helper: Right lower leg     Bathing assist Assist Level: Maximal Assistance - Patient 24 - 49%     Upper Body Dressing/Undressing Upper body dressing   What is the patient wearing?: Pull over shirt    Upper body assist Assist Level: Contact Guard/Touching assist    Lower Body Dressing/Undressing Lower body dressing      What is the patient wearing?: Underwear/pull up, Pants     Lower body assist Assist for lower body dressing: Maximal Assistance - Patient 25 - 49%     Toileting Toileting    Toileting assist Assist for toileting: Maximal Assistance - Patient 25 - 49%     Transfers Chair/bed transfer  Transfers assist     Chair/bed transfer assist level: Moderate Assistance - Patient 50 - 74%     Locomotion Ambulation   Ambulation assist      Assist level: Moderate Assistance - Patient 50 - 74% Assistive device: Walker-rolling Max distance: 100'   Walk 10 feet activity   Assist     Assist level: Moderate Assistance -  Patient - 50 - 74% Assistive device: Walker-rolling   Walk 50 feet activity   Assist    Assist level: Moderate Assistance - Patient - 50 - 74% Assistive device: Walker-rolling    Walk 150 feet activity   Assist Walk 150 feet activity did not occur: Safety/medical concerns         Walk 10 feet on uneven surface  activity   Assist Walk 10 feet on uneven surfaces activity did not occur: Safety/medical concerns         Wheelchair     Assist Is the patient using a wheelchair?: Yes Type of Wheelchair: Manual    Wheelchair assist level: Minimal Assistance - Patient > 75% Max wheelchair distance: 125'    Wheelchair 50 feet with 2 turns activity    Assist        Assist Level: Minimal Assistance - Patient > 75%    Wheelchair 150 feet activity     Assist      Assist Level: Minimal Assistance - Patient > 75%   Blood pressure 123/83, pulse 61, temperature 97.7 F (36.5 C), resp. rate 18, height 5' 7 (1.702 m), weight 92.6 kg, SpO2 95%.   Medical Problem List and Plan: 1. 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  Antithrombotics: -DVT/anticoagulation:  Pharmaceutical: Lovenox              -antiplatelet therapy: ASA 3. Pain Management: Tylenol  prn 4. Mood/Behavior/Sleep: LCSW to follow for evaluation and support.              -antipsychotic agents: Seroquel  at bedtime (was increased at [facility]).             --continue Seroquel  and melatonin for sleep.  5. Neuropsych/cognition: This patient may be intermittently capable of making decisions on his own behalf. 6. Skin/Wound Care: Routine pressure relief measures. 7. Fluids/Electrolytes/Nutrition: Monitor I/O 8.  Right temporal lobe clival chordoma/radiation necrosis: Decadron  tapered to 2 mg daily since 12/21 9. GERD: On Protonix  10. AKI; BUN/Scr improving form 31-->24 and 1.4-->1.1 11. Chronic right RTC injury:  12. Depression: On zoloft  100 mg daily 13.  Constipation: Has been refusing laxative. Will d/c miralax . Change senna to once daily if no BM in 24 hours.     LOS: 2 days A FACE TO FACE EVALUATION WAS PERFORMED  Justin Hurst 09/22/2024, 8:46 AM     "

## 2024-09-23 DIAGNOSIS — I6789 Other cerebrovascular disease: Secondary | ICD-10-CM | POA: Diagnosis not present

## 2024-09-23 DIAGNOSIS — Z981 Arthrodesis status: Secondary | ICD-10-CM | POA: Diagnosis not present

## 2024-09-23 DIAGNOSIS — G464 Cerebellar stroke syndrome: Secondary | ICD-10-CM | POA: Diagnosis not present

## 2024-09-23 DIAGNOSIS — I63011 Cerebral infarction due to thrombosis of right vertebral artery: Secondary | ICD-10-CM | POA: Diagnosis not present

## 2024-09-23 NOTE — Progress Notes (Signed)
 Physical Therapy Session Note  Patient Details  Name: Justin Hurst MRN: [id number] Date of Birth: 1955/11/08  Today's Date: 09/23/2024 PT Individual Time: 8580-8484 PT Individual Time Calculation (min): 56 min   Short Term Goals: Week 1:  PT Short Term Goal 1 (Week 1): Pt will complete transfers CGA consistently PT Short Term Goal 2 (Week 1): Pt will ambulate 150' with LRAD with CGA PT Short Term Goal 3 (Week 1): Pt will complete up/down 12 steps with BHRs with CGA  Skilled Therapeutic Interventions/Progress Updates: Patient sitting in WC on entrance to room. Patient alert and agreeable to PT session.   Patient reported no pain  Therapeutic Activity: Bed Mobility: Pt performed sit<supine from EOB with supervision. Transfers: Pt performed sit<>stand transfers throughout session with CGA to stand and overall modA to stabilize per R/anterior lean. Pt performed stand pivot transfer at end of session with R HHA and overall min/modA and cue to control descent via [facility] railing.   Neuromuscular Re-ed: - 1 round of static standing balance in RW with minA and occasionally modA to prevent R lean/LOB (minA when adhering to cue to shift hips/shoulders to L).  - Pt standing in hallway with L UE on rail with cue to maintain contact with L shoulder to wall to prevent R lean. Pt also with anterior lean and cued to shift weight onto heels (frequent cuing requiring for pt to correct). Pt required overall min/modA. Pt with B knees in slight flexion and unable to maintain full knee extension (B feet not underneath COG, but slightly posterior to). After 3rd round pt cued to increase B knee extension and to shift weight posteriorly at hips vs only upper trunk increasing thoracic extension. Pt reported ability to feel the weight shift of this this session.   NMR performed for improvements in motor control and coordination, balance, sequencing, judgement, and self confidence/ efficacy in performing all aspects of  mobility at highest level of independence.   Therapeutic Exercise: Pt performed the following exercises with therapist providing the described cuing and facilitation for improvement. - B LAQ with 5lb ankle weight donned 3 x 15 with cue to control eccentric and 3 second hold in knee extension - B hip flexion 2 x 15 with 5lb ankle weight donned, cue to control eccentric and 3 second hold in hip flexion  Patient supine in bed at end of session with brakes locked,  bed alarm set, and all needs within reach.      Therapy Documentation Precautions:  Precautions Precautions: Fall Recall of Precautions/Restrictions: Intact Restrictions Weight Bearing Restrictions Per Provider Order: No  Therapy/Group: Individual Therapy  Douglas Smolinsky PTA 09/23/2024, 3:16 PM

## 2024-09-23 NOTE — Progress Notes (Signed)
 Occupational Therapy Session Note  Patient Details  Name: Justin Hurst MRN: [id number] Date of Birth: 04-Nov-1955  Today's Date: 09/23/2024 OT Individual Time: 9060-8949 OT Individual Time Calculation (min): 71 min    Short Term Goals: Week 1:  OT Short Term Goal 1 (Week 1): patient will complete toilet transfer mod A with increased body awareness OT Short Term Goal 2 (Week 1): patient will demonstrate ability to complete UB dressing with increased dynamic balance with SUP sitting on EOB OT Short Term Goal 3 (Week 1): patient will complete LB dressing standing to pull pants up with min A with increased standing balance  Skilled Therapeutic Interventions/Progress Updates:  Pt greeted supine in bed, pt agreeable to OT intervention.      Transfers/bed mobility/functional mobility:  Pt completed supine>sit with supervision. Squat pivot to R and L side with MODA. Pt completed sit>stands to RW with MINA.     ADLs:  Grooming: pt completed seated oral care with supervision.  UB dressing:pt donned [place] shirt with set- up assist.   LB dressing: pt donned pants with MODA, assist needed to pull pants to waist line on R side and for balance d/t R lean.   Footwear: pt unable to access feet from w/c, donned shoes/socks with total A.   Bathing: pt completed bathing from sitting/standing with MIN- MODA, balance assist needed in standing d/t R lean.   Transfers: squat pivot transfers into shower and toilet with MIN - MODA. Use of grab bars to pull up on.   Toileting: pt with continent b/b void. Pt able to access periarea from sitting with CGA for balance.      NMR: pt completed various standing NMR tasks with a focus on maintaining midline orientation as pt tends to lean to R side. Placed step under RLE and had pt complete blocked practice of sit>stands to promote increased weightbearing into LLE.pt completed stands with MIN A.   Practiced targeted steps in standing with BUE support from RW  emphasis on maintaining midline orientation in standing. Pt continues to lean to R needing MODA at times for standing balance.   Practiced dynamic reaching in standing with pt instructed to reach out of BOS to remove squigz from mirror with pt instructed to keep midline posture. Pt can sometimes self correct for a couple secs but then continues to lean R.                   Ended session with pt seated in w/c with all needs within reach.   Therapy Documentation Precautions:  Precautions Precautions: Fall Recall of Precautions/Restrictions: Intact Restrictions Weight Bearing Restrictions Per Provider Order: No  Pain: No pain    Therapy/Group: Individual Therapy  Ronal Gift [facility] 09/23/2024, 12:16 PM

## 2024-09-23 NOTE — Progress Notes (Signed)
 "                                                        PROGRESS NOTE   Subjective/Complaints:  Discussed 10/08/24 d/c date , pt is satisfied with the date   ROS- neg CP, SOB, N/V/D  Objective:   No results found. Recent Labs    09/21/24 0431  WBC 10.1  HGB 13.7  HCT 40.1  PLT 266   Recent Labs    09/21/24 0431  NA 138  K 4.3  CL 102  CO2 27  GLUCOSE 92  BUN 24*  CREATININE 0.93  CALCIUM  9.3    Intake/Output Summary (Last 24 hours) at 09/23/2024 0913 Last data filed at 09/23/2024 0824 Gross per 24 hour  Intake 240 ml  Output 800 ml  Net -560 ml        Physical Exam: Vital Signs Blood pressure 116/72, pulse 66, temperature 97.9 F (36.6 C), temperature source Oral, resp. rate 17, height 5' 7 (1.702 m), weight 92.6 kg, SpO2 95%.   General: No acute distress Mood and affect are appropriate Heart: Regular rate and rhythm no rubs murmurs or extra sounds Lungs: Clear to auscultation, breathing unlabored, no rales or wheezes Abdomen: Positive bowel sounds, soft nontender to palpation, nondistended Extremities: No clubbing, cyanosis, or edema Skin: No evidence of breakdown, no evidence of rash Neurologic: Cranial nerves II through XII intact, motor strength is 5/5 in bilateral deltoid, bicep, tricep, grip, hip flexor, knee extensors, ankle dorsiflexor and plantar flexor Sensory exam normal sensation to light touch  in bilateral upper and lower extremities Cerebellar exam mild dysmetria R FNF  Musculoskeletal: ~50% ankle PF/DF, inv/ev   Assessment/Plan: 1. Functional deficits which require 3+ hours per day of interdisciplinary therapy in a comprehensive inpatient rehab setting. Physiatrist is providing close team supervision and 24 hour management of active medical problems listed below. Physiatrist and rehab team continue to assess barriers to discharge/monitor patient progress toward functional and medical goals  Care Tool:  Bathing    Body parts bathed by  patient: Right arm, Left arm, Chest, Abdomen, Front perineal area, Right upper leg, Left upper leg, Face, Left lower leg   Body parts bathed by helper: Right lower leg, Buttocks     Bathing assist Assist Level: Moderate Assistance - Patient 50 - 74%     Upper Body Dressing/Undressing Upper body dressing   What is the patient wearing?: Pull over shirt    Upper body assist Assist Level: Set up assist    Lower Body Dressing/Undressing Lower body dressing      What is the patient wearing?: Underwear/pull up, Pants     Lower body assist Assist for lower body dressing: Moderate Assistance - Patient 50 - 74%     Toileting Toileting    Toileting assist Assist for toileting: Moderate Assistance - Patient 50 - 74%     Transfers Chair/bed transfer  Transfers assist     Chair/bed transfer assist level: Moderate Assistance - Patient 50 - 74%     Locomotion Ambulation   Ambulation assist      Assist level: Moderate Assistance - Patient 50 - 74% Assistive device: Walker-rolling Max distance: 100'   Walk 10 feet activity   Assist     Assist level: Moderate Assistance - Patient - 53 -  74% Assistive device: Walker-rolling   Walk 50 feet activity   Assist    Assist level: Moderate Assistance - Patient - 50 - 74% Assistive device: Walker-rolling    Walk 150 feet activity   Assist Walk 150 feet activity did not occur: Safety/medical concerns         Walk 10 feet on uneven surface  activity   Assist Walk 10 feet on uneven surfaces activity did not occur: Safety/medical concerns         Wheelchair     Assist Is the patient using a wheelchair?: Yes Type of Wheelchair: Manual    Wheelchair assist level: Minimal Assistance - Patient > 75% Max wheelchair distance: 125'    Wheelchair 50 feet with 2 turns activity    Assist        Assist Level: Minimal Assistance - Patient > 75%   Wheelchair 150 feet activity     Assist       Assist Level: Minimal Assistance - Patient > 75%   Blood pressure 116/72, pulse 66, temperature 97.9 F (36.6 C), temperature source Oral, resp. rate 17, height 5' 7 (1.702 m), weight 92.6 kg, SpO2 95%.   Medical Problem List and Plan: 1. 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  Antithrombotics: -DVT/anticoagulation:  Pharmaceutical: Lovenox              -antiplatelet therapy: ASA 3. Pain Management: Tylenol  prn 4. Mood/Behavior/Sleep: LCSW to follow for evaluation and support.              -antipsychotic agents: Seroquel  at bedtime (was increased at [facility]).             --continue Seroquel  and melatonin for sleep.  5. Neuropsych/cognition: This patient may be intermittently capable of making decisions on his own behalf. 6. Skin/Wound Care: Routine pressure relief measures. 7. Fluids/Electrolytes/Nutrition: Monitor I/O 8.  Right temporal lobe clival chordoma/radiation necrosis: Decadron  tapered to 2 mg daily since 12/21 9. GERD: On Protonix  10. AKI; BUN/Scr improving form 31-->24 and 1.4-->1.1 11. Chronic right RTC injury:  12. Depression: On zoloft  100 mg daily 13.  Constipation: Has been refusing laxative. Will d/c miralax . Change senna to once daily if no BM in 24 hours.   14.  Bilateral ankle fusion limiting ability to compensate for stroke related balance deficit  LOS: 3 days A FACE TO FACE EVALUATION WAS PERFORMED  Prentice FORBES Compton 09/23/2024, 9:13 AM     "

## 2024-09-23 NOTE — Progress Notes (Signed)
 Occupational Therapy Session Note  Patient Details  Name: Justin Hurst MRN: [id number] Date of Birth: Jan 01, 1956  Today's Date: 09/23/2024 OT Individual Time: 1050-1205 OT Individual Time Calculation (min): 75 min    Short Term Goals: Week 1:  OT Short Term Goal 1 (Week 1): patient will complete toilet transfer mod A with increased body awareness OT Short Term Goal 2 (Week 1): patient will demonstrate ability to complete UB dressing with increased dynamic balance with SUP sitting on EOB OT Short Term Goal 3 (Week 1): patient will complete LB dressing standing to pull pants up with min A with increased standing balance  Skilled Therapeutic Interventions/Progress Updates:    Pt received in wc ready for therapy. Pt taken to gym.   Therapy session focused on symmetrical movement to inhibit R lean with sit to stands and standing. Squat pivot to mat CGA with cues for head hip ratio.  Pt practiced scooting down mat to R 5 x and to L 5x with cues for controlling hips side to side and controlled descent into sit for active use of B legs. Sit to stands with CGA and symmetrical but once standing shifts to R and shifts more to R with stand to sits.  To work on symmetrical control, had pt do numerous sit >< stands with use of mirror, reaching L hand to target on mat,  standing at mirror with weight shifts to the L to retrieve suction cups.  Repeated 3 x with fair control. He tends to scoot L foot out laterally so needs cues to keep L foot stable in standing to increase balance.  Discussed with pt how these skills will carry over to what he needs to be able to toilet more independently.  RUE AROM and strength to develop ROM needed for self cleansing. L side weight shifting to lift R hip in sitting (to work towards movement needed for self cleansing on the toilet. )  General BUE strength with 4 lb dowel bar for shoulders and arms.   B grip strength 40 lbs.   Pt tolerated sitting on mat for entire  session.  Min squat pivot back to wc and pt started to self propel wc back to his room. Therapist assisted the rest of the way.   Alarm set and all needs met.   Therapy Documentation Precautions:  Precautions Precautions: Fall Recall of Precautions/Restrictions: Intact Restrictions Weight Bearing Restrictions Per Provider Order: No    Vital Signs: Therapy Vitals Temp: 97.9 F (36.6 C) Temp Source: Oral Pulse Rate: 66 Resp: 17 BP: 116/72 Patient Position (if appropriate): Lying Oxygen Therapy SpO2: 95 % O2 Device: Room Air Pain: Pain Assessment Pain Scale: 0-10 Pain Score: 0-No pain ADL: ADL Equipment Provided: Reacher Eating: Set up Where Assessed-Eating: Bed level Grooming: Supervision/safety Where Assessed-Grooming: Wheelchair, Sitting at sink Upper Body Bathing: Supervision/safety Where Assessed-Upper Body Bathing: Sitting at sink Lower Body Bathing: Moderate assistance Where Assessed-Lower Body Bathing: Standing at sink, Sitting at sink Upper Body Dressing: Supervision/safety Where Assessed-Upper Body Dressing: Wheelchair Lower Body Dressing: Moderate assistance, Maximal assistance Where Assessed-Lower Body Dressing: Standing at sink Toileting: Moderate assistance Where Assessed-Toileting: Teacher, Adult Education: Curator Method: Ambulance Person: Acupuncturist: Unable to assess [employer] Method: Unable to assess   Therapy/Group: Individual Therapy  Justin Hurst 09/23/2024, 8:33 AM

## 2024-09-24 DIAGNOSIS — G464 Cerebellar stroke syndrome: Secondary | ICD-10-CM | POA: Diagnosis not present

## 2024-09-24 DIAGNOSIS — Z981 Arthrodesis status: Secondary | ICD-10-CM | POA: Diagnosis not present

## 2024-09-24 DIAGNOSIS — I6789 Other cerebrovascular disease: Secondary | ICD-10-CM | POA: Diagnosis not present

## 2024-09-24 DIAGNOSIS — I63011 Cerebral infarction due to thrombosis of right vertebral artery: Secondary | ICD-10-CM | POA: Diagnosis not present

## 2024-09-24 MED ORDER — CIPROFLOXACIN HCL 0.3 % OP SOLN
1.0000 [drp] | OPHTHALMIC | Status: DC
  Administered 2024-09-24 – 2024-10-01 (×30): 1 [drp] via OPHTHALMIC
  Filled 2024-09-24: qty 2.5

## 2024-09-24 NOTE — Progress Notes (Signed)
 Speech Language Pathology Daily Session Note  Patient Details  Name: Justin Hurst MRN: [id number] Date of Birth: January 20, 1956  Today's Date: 09/24/2024 SLP Individual Time: 1015-1059 SLP Individual Time Calculation (min): 44 min  Short Term Goals: Week 1: SLP Short Term Goal 1 (Week 1): Patient will complete pharyngeal strengthening exercises given supervision verbal A SLP Short Term Goal 2 (Week 1): Patient will demonstrate awareness of current medical situation given min verbal A SLP Short Term Goal 3 (Week 1): Patient will tolerate current diet with use of compensatory strategies given supervision A SLP Short Term Goal 4 (Week 1): Patient will complete functional problem solving tasks in daily situations given min verbal A  Skilled Therapeutic Interventions: SLP conducted skilled therapy session targeting cognition and swallowing goals. Patient endorses difficulty swallowing when going too fast, endorsing that he has difficulty remembering to slow rate. Conducted skilled assessment of tolerance of regular/thin liquids with patient demonstrating no overt s/sx of penetration/aspiration throughout, though benefited from supervision cues for slow rate. SLP then facilitated moderately complex deductive reasoning task, however patient quickly endorsed no interest in item and indicated task would be too difficult. Provided mildly complex shape-based deductive reasoning task, and patient benefited from min fading to supervision assist for organization as well as min cues to slow rate due to impulsivity. Of note, patient endorses increased difficulty focusing due to eye pain. RN/MD aware of eye discomfort per patient endorsement. In final minutes of session, patient completed mildly complex pattern replication problem solving task with supervision. Patient was left in room with call bell in reach and alarm set. SLP will continue to target goals per plan of care.        Pain Pain Assessment Pain Scale:  0-10 Pain Score: 6  Pain Location: Eyes  Therapy/Group: Individual Therapy  Rosina Downy, M.A., CCC-SLP  Justin Hurst 09/24/2024, 10:59 AM

## 2024-09-24 NOTE — Progress Notes (Signed)
 Occupational Therapy Session Note  Patient Details  Name: Justin Hurst MRN: [id number] Date of Birth: 05/03/1956  Today's Date: 09/24/2024 OT Individual Time: 9167-9069 OT Individual Time Calculation (min): 58 min    Short Term Goals: Week 1:  OT Short Term Goal 1 (Week 1): patient will complete toilet transfer mod A with increased body awareness OT Short Term Goal 2 (Week 1): patient will demonstrate ability to complete UB dressing with increased dynamic balance with SUP sitting on EOB OT Short Term Goal 3 (Week 1): patient will complete LB dressing standing to pull pants up with min A with increased standing balance  Skilled Therapeutic Interventions/Progress Updates:    Pt received in bed ready for therapy. Pt declined need to shower but did want to don new shorts.  Bed mobility supine to sit - S Sit to stand at EOB CGA with first trial with posterior lean and fall back onto bed. Cued pt to bear more weight through toes and he was successful second time Donned shorts over feet without A.  Stood to RW to pull over hips without A but did need min/mod A to steady balance.   Stand pivot to wc with RW with mod A due to R lean.  He maneuvered wc to sink to complete oral care.   CGA w/c to toilet with grab bar to work on simulated cleansing task.. pt is able to reach more easily but needs to work on supination and int rotation of R arm.  He transferred back to wc with bar with CGA and then was taken to main gym.  Pt c/o R eye irritation, notified MD  Therapy session initially focused on smooth and controlled squat pivots wc to mat to wc 8x with cues for forward weight shift and head hip orientation. Pt initially struggling with sequencing and motor planning.   Progressed from mod to min to CGA.   Sit to stands from mat focusing on even weight bearing through feet and forward wt shift. Practiced with and without UE support to push up from mat.   For RUE, used paddle maze so pt had  to work on forearm rotation movements.    Introduced the theraband flex bar for grasp and forearm strength in yellow. Provided pt with printout of where to buy one if he should choose to use it post discharge.   He has great difficulty donning shoes due to limited ankle ROM,  demonstrated and then  pt practice use of a shoe horn funnel with a string and he donned shoes easily with shoe funnel.    Provided pt a handout of where to buy the funnel.    Pt self propelled halfway back to his room.   All needs met and alarm belt on.    Therapy Documentation Precautions:  Precautions Precautions: Fall Recall of Precautions/Restrictions: Intact Restrictions Weight Bearing Restrictions Per Provider Order: No   Pain: Pain Assessment Pain Scale: 0-10 Pain Score: 0-No pain    Therapy/Group: Individual Therapy  Justin Hurst 09/24/2024, 10:15 AM

## 2024-09-24 NOTE — Progress Notes (Signed)
 Physical Therapy Session Note  Patient Details  Name: Justin Hurst MRN: [id number] Date of Birth: 1956/08/01  Today's Date: 09/24/2024 PT Individual Time: 8892-8854; 1420 - 1458 PT Individual Time Calculation (min): 38 min; 38 min  Today's Date: 09/24/2024 PT Missed Time: 7 Minutes + 22 min Missed Time Reason: Patient fatigue; feeling unwell  Short Term Goals: Week 1:  PT Short Term Goal 1 (Week 1): Pt will complete transfers CGA consistently PT Short Term Goal 2 (Week 1): Pt will ambulate 150' with LRAD with CGA PT Short Term Goal 3 (Week 1): Pt will complete up/down 12 steps with BHRs with CGA  SESSION 1 Skilled Therapeutic Interventions/Progress Updates: Patient sitting in WC on entrance to room. Patient alert and agreeable to PT session.   Patient reported unrated discomfort in R eye (blood noted on R lateral side of eye with medical team aware). Pt also reported feeling off and that something felt different and in some ways worse than previous days (vitals assessed with nothing remarkable present - nsg made aware). At end of last NMRE, pt requested to return to room to rest to hopefully avoid missing more minutes with afternoon PT session due to increase in some shakiness in hands and fatigue and still feeling off.  Therapeutic Activity: Transfers: Pt performed sit<>stand transfers throughout session with RW and with overall heavy minA due to R lean. Pt required cues to maintain safe proximity to RW and to ensure pt does full pivot to safely sit in chair.  Neuromuscular Re-ed: NMR facilitated during session with focus on static standing balance, weight shift. - Pt statically stood close to 2 min CGA in RW with cues throughout to shift weight posteriorly onto heels. Pt with better control on weight shifting appropriately vs solely doing thoracic extension. Pt also with decrease in R lean this first static stance in RW. 2nd attempt pt required minA to prevent R LOB per lean and  presented with increase in difficulty in maintaining static standing balance at center (2 min).  - Pt stood in RW with mirror feedback with cues to keep midline aligned with theraband vertically donned on mirror. Pt required minA and continued to require cue to correct self back to midline per R lean, as well as posteriorly weight shifting. Pt required seated rest.  NMR performed for improvements in motor control and coordination, balance, sequencing, judgement, and self confidence/ efficacy in performing all aspects of mobility at highest level of independence.   Patient sitting in WC at end of session with brakes locked, belt alarm set, and all needs within reach.  SESSION 2 Skilled Therapeutic Interventions/Progress Updates: Patient sitting in WC on entrance to room. Patient alert and agreeable to PT session.   Patient reported feeling better than morning PT session but still fatigued.   Therapeutic Activity: Bed Mobility: Pt performed sit<supine from EOB with supervision for safety. Transfers: Pt performed sit<>stand transfers throughout session with CGA in preparation for interventions. Pt modA to transfer from WC<EOB end of session with R HHA due to fatigue and R lean.  Neuromuscular Re-ed: - static stance in // bars with overall CGA and cue to maintain upright standing posture avoiding R/anterior lean (mod cuing throughout to shift posteriorly to the L back to center). Pt continues to demonstrate improved weight shift using hips/ankles. Pt progressed to having green theraband pull weight anteriorly to augment pt's error. Pt again with CGA for safety and same cues - Pt ambulated with RW and WC follow for safety  and some improvement in having decrease in R/anterior lean and ability to correct weight shift accordingly vs 2 days ago with this PTA. Pt overall modA and added cuing to increase R LE step laterally to avoid narrow BOS  - Pt attempted to perform step to 3 step with L LE at 45* angle  to work on weight shifting but pt ultimately required return to room due to increase in R lean potentially from fatigue from pt's report.  NMR performed for improvements in motor control and coordination, balance, sequencing, judgement, and self confidence/ efficacy in performing all aspects of mobility at highest level of independence.   Patient supine in bed at end of session with brakes locked, bed alarm set, and all needs within reach.       Therapy Documentation Precautions:  Precautions Precautions: Fall Recall of Precautions/Restrictions: Intact Restrictions Weight Bearing Restrictions Per Provider Order: No   Therapy/Group: Individual Therapy  Wyatte Dames PTA 09/24/2024, 11:59 AM

## 2024-09-24 NOTE — Progress Notes (Signed)
 "                                                        PROGRESS NOTE   Subjective/Complaints:  No issues overnite , discussed ambulation distance  Appetite good, states bowels and bladder doing ok   ROS- neg CP, SOB, N/V/D  Objective:   No results found. No results for input(s): WBC, HGB, HCT, PLT in the last 72 hours.  No results for input(s): NA, K, CL, CO2, GLUCOSE, BUN, CREATININE, CALCIUM  in the last 72 hours.   Intake/Output Summary (Last 24 hours) at 09/24/2024 0827 Last data filed at 09/24/2024 0819 Gross per 24 hour  Intake 720 ml  Output 1225 ml  Net -505 ml        Physical Exam: Vital Signs Blood pressure 115/69, pulse 63, temperature 97.9 F (36.6 C), resp. rate 16, height 5' 7 (1.702 m), weight 92.6 kg, SpO2 95%.   General: No acute distress Mood and affect are appropriate Heart: Regular rate and rhythm no rubs murmurs or extra sounds Lungs: Clear to auscultation, breathing unlabored, no rales or wheezes Abdomen: Positive bowel sounds, soft nontender to palpation, nondistended Extremities: No clubbing, cyanosis, or edema Skin: No evidence of breakdown, no evidence of rash Neurologic: Cranial nerves II through XII intact, motor strength is 5/5 in bilateral deltoid, bicep, tricep, grip, hip flexor, knee extensors, ankle dorsiflexor and plantar flexor Sensory exam normal sensation to light touch  in bilateral upper and lower extremities Cerebellar exam mild dysmetria R FNF  Musculoskeletal: ~50% ankle PF/DF, inv/ev   Assessment/Plan: 1. Functional deficits which require 3+ hours per day of interdisciplinary therapy in a comprehensive inpatient rehab setting. Physiatrist is providing close team supervision and 24 hour management of active medical problems listed below. Physiatrist and rehab team continue to assess barriers to discharge/monitor patient progress toward functional and medical goals  Care Tool:  Bathing    Body parts  bathed by patient: Right arm, Left arm, Chest, Abdomen, Front perineal area, Right upper leg, Left upper leg, Face, Left lower leg   Body parts bathed by helper: Right lower leg, Buttocks     Bathing assist Assist Level: Moderate Assistance - Patient 50 - 74%     Upper Body Dressing/Undressing Upper body dressing   What is the patient wearing?: Pull over shirt    Upper body assist Assist Level: Set up assist    Lower Body Dressing/Undressing Lower body dressing      What is the patient wearing?: Underwear/pull up, Pants     Lower body assist Assist for lower body dressing: Moderate Assistance - Patient 50 - 74%     Toileting Toileting    Toileting assist Assist for toileting: Moderate Assistance - Patient 50 - 74%     Transfers Chair/bed transfer  Transfers assist     Chair/bed transfer assist level: Moderate Assistance - Patient 50 - 74%     Locomotion Ambulation   Ambulation assist      Assist level: Moderate Assistance - Patient 50 - 74% Assistive device: Walker-rolling Max distance: 100'   Walk 10 feet activity   Assist     Assist level: Moderate Assistance - Patient - 50 - 74% Assistive device: Walker-rolling   Walk 50 feet activity   Assist    Assist level: Moderate Assistance -  Patient - 50 - 74% Assistive device: Walker-rolling    Walk 150 feet activity   Assist Walk 150 feet activity did not occur: Safety/medical concerns         Walk 10 feet on uneven surface  activity   Assist Walk 10 feet on uneven surfaces activity did not occur: Safety/medical concerns         Wheelchair     Assist Is the patient using a wheelchair?: Yes Type of Wheelchair: Manual    Wheelchair assist level: Minimal Assistance - Patient > 75% Max wheelchair distance: 125'    Wheelchair 50 feet with 2 turns activity    Assist        Assist Level: Minimal Assistance - Patient > 75%   Wheelchair 150 feet activity      Assist      Assist Level: Minimal Assistance - Patient > 75%   Blood pressure 115/69, pulse 63, temperature 97.9 F (36.6 C), resp. rate 16, height 5' 7 (1.702 m), weight 92.6 kg, SpO2 95%.   Medical Problem List and Plan: 1. 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  Antithrombotics: -DVT/anticoagulation:  Pharmaceutical: Lovenox              -antiplatelet therapy: ASA 3. Pain Management: Tylenol  prn 4. Mood/Behavior/Sleep: LCSW to follow for evaluation and support.              -antipsychotic agents: Seroquel  at bedtime (was increased at [facility]).             --continue Seroquel  and melatonin for sleep.  5. Neuropsych/cognition: This patient may be intermittently capable of making decisions on his own behalf. 6. Skin/Wound Care: Routine pressure relief measures. 7. Fluids/Electrolytes/Nutrition: Monitor I/O 8.  Right temporal lobe clival chordoma/radiation necrosis: Decadron  tapered to 2 mg daily since 12/21 9. GERD: On Protonix  10. AKI; BUN/Scr improving     Latest Ref Rng & Units 09/21/2024    4:31 AM 02/25/2024    3:29 AM 02/11/2024    1:32 PM  BMP  Glucose 70 - 99 mg/dL 92  889  88   BUN 8 - 23 mg/dL 24  24  25    Creatinine 0.61 - 1.24 mg/dL 9.06  8.94  8.85   Sodium 135 - 145 mmol/L 138  137  138   Potassium 3.5 - 5.1 mmol/L 4.3  4.2  4.8   Chloride 98 - 111 mmol/L 102  104  104   CO2 22 - 32 mmol/L 27  21  26    Calcium  8.9 - 10.3 mg/dL 9.3  8.6  9.6     11. Chronic right RTC injury:  12. Depression: On zoloft  100 mg daily 13.  Constipation: Has been refusing laxative. Will d/c miralax . Change senna to once daily if no BM in 24 hours.   14.  Bilateral ankle fusion limiting ability to compensate for stroke related balance deficit  LOS: 4 days A FACE TO FACE EVALUATION WAS PERFORMED  Justin Hurst 09/24/2024, 8:27 AM     "

## 2024-09-25 NOTE — Progress Notes (Signed)
 "                                                        PROGRESS NOTE   Subjective/Complaints:  No issues overnite , eating lunch   ROS- neg CP, SOB, N/V/D  Objective:   No results found. No results for input(s): WBC, HGB, HCT, PLT in the last 72 hours.  No results for input(s): NA, K, CL, CO2, GLUCOSE, BUN, CREATININE, CALCIUM  in the last 72 hours.   Intake/Output Summary (Last 24 hours) at 09/25/2024 1143 Last data filed at 09/25/2024 0803 Gross per 24 hour  Intake 834 ml  Output 1625 ml  Net -791 ml        Physical Exam: Vital Signs Blood pressure 132/80, pulse 63, temperature 97.7 F (36.5 C), temperature source Oral, resp. rate 16, height 5' 7 (1.702 m), weight 92.6 kg, SpO2 98%.   General: No acute distress Mood and affect are appropriate Heart: Regular rate and rhythm no rubs murmurs or extra sounds Lungs: Clear to auscultation, breathing unlabored, no rales or wheezes Abdomen: Positive bowel sounds, soft nontender to palpation, nondistended Extremities: No clubbing, cyanosis, or edema Skin: No evidence of breakdown, no evidence of rash Neurologic: Cranial nerves II through XII intact, motor strength is 5/5 in bilateral deltoid, bicep, tricep, grip, hip flexor, knee extensors, ankle dorsiflexor and plantar flexor Sensory exam normal sensation to light touch  in bilateral upper and lower extremities Cerebellar exam mild dysmetria R FNF  Musculoskeletal: ~50% ankle PF/DF, inv/ev   Assessment/Plan: 1. Functional deficits which require 3+ hours per day of interdisciplinary therapy in a comprehensive inpatient rehab setting. Physiatrist is providing close team supervision and 24 hour management of active medical problems listed below. Physiatrist and rehab team continue to assess barriers to discharge/monitor patient progress toward functional and medical goals  Care Tool:  Bathing    Body parts bathed by patient: Right arm, Left arm,  Chest, Abdomen, Front perineal area, Right upper leg, Left upper leg, Face, Left lower leg   Body parts bathed by helper: Right lower leg, Buttocks     Bathing assist Assist Level: Moderate Assistance - Patient 50 - 74%     Upper Body Dressing/Undressing Upper body dressing   What is the patient wearing?: Pull over shirt    Upper body assist Assist Level: Set up assist    Lower Body Dressing/Undressing Lower body dressing      What is the patient wearing?: Underwear/pull up, Pants     Lower body assist Assist for lower body dressing: Moderate Assistance - Patient 50 - 74%     Toileting Toileting    Toileting assist Assist for toileting: Moderate Assistance - Patient 50 - 74%     Transfers Chair/bed transfer  Transfers assist     Chair/bed transfer assist level: Moderate Assistance - Patient 50 - 74%     Locomotion Ambulation   Ambulation assist      Assist level: Moderate Assistance - Patient 50 - 74% Assistive device: Walker-rolling Max distance: 100'   Walk 10 feet activity   Assist     Assist level: Moderate Assistance - Patient - 50 - 74% Assistive device: Walker-rolling   Walk 50 feet activity   Assist    Assist level: Moderate Assistance - Patient - 50 - 74% Assistive  device: Walker-rolling    Walk 150 feet activity   Assist Walk 150 feet activity did not occur: Safety/medical concerns         Walk 10 feet on uneven surface  activity   Assist Walk 10 feet on uneven surfaces activity did not occur: Safety/medical concerns         Wheelchair     Assist Is the patient using a wheelchair?: Yes Type of Wheelchair: Manual    Wheelchair assist level: Minimal Assistance - Patient > 75% Max wheelchair distance: 125'    Wheelchair 50 feet with 2 turns activity    Assist        Assist Level: Minimal Assistance - Patient > 75%   Wheelchair 150 feet activity     Assist      Assist Level: Minimal  Assistance - Patient > 75%   Blood pressure 132/80, pulse 63, temperature 97.7 F (36.5 C), temperature source Oral, resp. rate 16, height 5' 7 (1.702 m), weight 92.6 kg, SpO2 98%.   Medical Problem List and Plan: 1. 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  Antithrombotics: -DVT/anticoagulation:  Pharmaceutical: Lovenox              -antiplatelet therapy: ASA 3. Pain Management: Tylenol  prn 4. Mood/Behavior/Sleep: LCSW to follow for evaluation and support.              -antipsychotic agents: Seroquel  at bedtime (was increased at [facility]).             --continue Seroquel  and melatonin for sleep.  5. Neuropsych/cognition: This patient may be intermittently capable of making decisions on his own behalf. 6. Skin/Wound Care: Routine pressure relief measures. 7. Fluids/Electrolytes/Nutrition: Monitor I/O 8.  Right temporal lobe clival chordoma/radiation necrosis: Decadron  tapered to 2 mg daily since 12/21 9. GERD: On Protonix  10. AKI; BUN/Scr improving     Latest Ref Rng & Units 09/21/2024    4:31 AM 02/25/2024    3:29 AM 02/11/2024    1:32 PM  BMP  Glucose 70 - 99 mg/dL 92  889  88   BUN 8 - 23 mg/dL 24  24  25    Creatinine 0.61 - 1.24 mg/dL 9.06  8.94  8.85   Sodium 135 - 145 mmol/L 138  137  138   Potassium 3.5 - 5.1 mmol/L 4.3  4.2  4.8   Chloride 98 - 111 mmol/L 102  104  104   CO2 22 - 32 mmol/L 27  21  26    Calcium  8.9 - 10.3 mg/dL 9.3  8.6  9.6     11. Chronic right RTC injury:  12. Depression: On zoloft  100 mg daily 13.  Constipation: Has been refusing laxative. Will d/c miralax . Change senna to once daily if no BM in 24 hours.   14.  Bilateral ankle fusion limiting ability to compensate for stroke related balance deficit  LOS: 5 days A FACE TO FACE EVALUATION WAS PERFORMED  Justin Hurst 09/25/2024, 11:43 AM     "

## 2024-09-25 NOTE — Progress Notes (Signed)
 Physical Therapy Session Note  Patient Details  Name: Justin Hurst MRN: [id number] Date of Birth: 22-Jan-1956  Today's Date: 09/25/2024 PT Individual Time: 8581-8471 PT Individual Time Calculation (min): 70 min   Short Term Goals: Week 1:  PT Short Term Goal 1 (Week 1): Pt will complete transfers CGA consistently PT Short Term Goal 2 (Week 1): Pt will ambulate 150' with LRAD with CGA PT Short Term Goal 3 (Week 1): Pt will complete up/down 12 steps with BHRs with CGA  Skilled Therapeutic Interventions/Progress Updates: Patient semi-reclined in bed on entrance to room. Patient alert and agreeable to PT session.   Patient reported no pain only some fatigue  Therapeutic Activity: Bed Mobility: Pt performed supine<>sit on EOB with supervision.  Transfers: Pt performed sit<>stand transfer initially with no AD and modA and cue to perform stand step with target cue to step adequately/safely. Pt reported decrease in proprioception and required increased cuing at first to pivot in direction of transfer. Pt performed 2nd pivot with closer to minA following adherence to cue. Pt back in room and performed stand step transfer with RW with heavy min/modA and cue to increase L lean per R lean presentation. Pt also cued to maintain safe proximity to RW.   Pt required return to room during session to void bladder via urinal sitting in WC without incident and then washed hands in sink and transported back to main gym.  Neuromuscular Re-ed: - Static stance in // bars with cue to maintain centered posture. Pt has improved R + anterior lean since evaluation, but continues to have it present. Pt able to now weight shift appropriately to correct using ankle strategies and maintaining hips/shoulders aligned. Pt then cued to lean until L hip touches railing to over correct R lean (also needing cue to shift weight posteriorly onto heels).  - Static stance in corner wall with L UE and posterior side maintaining  contact while PTA used green theraband to pull pt into R/anterior lean. Pt required cues throughout to maintain contact with wall. Pt unable to keep R LE in knee extension for more than 1-2 seconds (kept in slight flexion) and would have increase in R lean.   - pt still in corner wall with instructions to lean to R/anterior to take off squigs from mirror and then to shift weight back to wall, making contact with L UE and posterior shoulders. Pt minA throughout and modA to shift hips off of wall onto R LE to lean. 2nd round performed with pt cued to also remove contact of hips to increase lean. Pt required 2 moments of modA to prevent LOB to R, but overall improved ability to adequately weight shift back to corner of wall.   NMR performed for improvements in motor control and coordination, balance, sequencing, judgement, and self confidence/ efficacy in performing all aspects of mobility at highest level of independence.   Therapeutic Exercise: Pt performed the following exercises with therapist providing the described cuing and facilitation for improvement. Pt requested HEP to perform on day off 1/4. Cue to control eccentric throughout - Supine SLR B LE with cue for sequence and to flex contralateral LE to increase stability/low back comfortability. 3 x 10  - Supine 90/90 with cue to maintain 90* angle at knee. 3 x 10 - sidelying hip abduction B LE's with cue for mechanics. 2 x 10  Patient supine in bed at end of session with brakes locked, bed alarm set, and all needs within reach.  Therapy Documentation Precautions:  Precautions Precautions: Fall Recall of Precautions/Restrictions: Intact Restrictions Weight Bearing Restrictions Per Provider Order: No  Therapy/Group: Individual Therapy  Rashi Giuliani PTA 09/25/2024, 3:36 PM

## 2024-09-25 NOTE — Progress Notes (Signed)
 Speech Language Pathology Daily Session Note  Patient Details  Name: Justin Hurst MRN: [id number] Date of Birth: 01-07-1956  Today's Date: 09/25/2024 SLP Individual Time: 0800-0857 SLP Individual Time Calculation (min): 57 min  Short Term Goals: Week 1: SLP Short Term Goal 1 (Week 1): Patient will complete pharyngeal strengthening exercises given supervision verbal A SLP Short Term Goal 2 (Week 1): Patient will demonstrate awareness of current medical situation given min verbal A SLP Short Term Goal 3 (Week 1): Patient will tolerate current diet with use of compensatory strategies given supervision A SLP Short Term Goal 4 (Week 1): Patient will complete functional problem solving tasks in daily situations given min verbal A  Skilled Therapeutic Interventions: Skilled therapy session focused on dysphagia and cognitive goals. SLP facilitated session by prompting patient to complete pharyngeal strengthening exercises. Patient completed x10 effortful swallows and x10 [place] given minA and visual feedback (mirror). Noted occasional throat clears throughout tx session, though patient reports this is baseline. SLP then targeted problem solving goals through medication management task. Patient was instructed to utilize written and verbalized instructions to complete BID pill box. Patient independently completed with no errors. SLP continued to challenge patient through ID of medication mistakes and prompted him to correct. 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 Patient left in [facility] with alarm set and call bell in reach. Continue POC.    Pain None reported  Therapy/Group: Individual Therapy  Angelea Penny M.A., CCC-SLP 09/25/2024, 7:34 AM

## 2024-09-25 NOTE — Progress Notes (Signed)
 Occupational Therapy Session Note  Patient Details  Name: Justin Hurst MRN: [id number] Date of Birth: 1955/11/07  Today's Date: 09/25/2024 OT Individual Time: 9049-8951 OT Individual Time Calculation (min): 58 min    Short Term Goals: Week 1:  OT Short Term Goal 1 (Week 1): patient will complete toilet transfer mod A with increased body awareness OT Short Term Goal 2 (Week 1): patient will demonstrate ability to complete UB dressing with increased dynamic balance with SUP sitting on EOB OT Short Term Goal 3 (Week 1): patient will complete LB dressing standing to pull pants up with min A with increased standing balance  Skilled Therapeutic Interventions/Progress Updates:  Skilled OT session completed to address dynamic standing balance, body awareness, and functional transfers. Pt received seated in WC, agreeable to participate in therapy. Pt reports no pain.  Pt dependently propelled to dayroom. OT building rapport and collaborating with pt on areas of concern, pt endorsing poor sitting/standing balance, and difficulty with transfers. Pt instructed on stand pivot transfer to standard chair, pt ambulates to standard chair with Mod A to support trunk at R side and manage RW. Pt and OT discuss safety issues with above transfer, pt verbalized understanding. Standard chair>EOM stand pivot Min A to support trunk. Pt displaying R lateral lean seated EOM, VC to self-correct. Pt completed ball toss 3x seated EOM to increase dynamic sitting balance during functional activity, pt required 2 VC to correct balance throughout d/t R lateral lean. Pt aware of R lateral lean d/t fatigue, but does not d/c task to self-correct. OT provided education on energy conservation techniques and prioritizing safety vs completing activity to reduce risk for falls, pt verbalized understanding. Pt completed 10x STS holding soccer ball with CGA, VC to lower bottom EOM safely, OT downgrades task to remove soccer ball for RUE  support EOM. OT providing VC to correct BOS, RLE displaying internal rotation with fatigue. Pt completed clothes pin retrieval from posterior region standing at RW to increase independence with toileting hygiene and LB dressing in standing. Pt required Min A to reduce R lateral lean. Pt seated in WC following task. WC<>EOM stand pivot CGA with RW, pt displaying improvement with control/body awareness during transfers, no VC required. Pt dependently propelled back to room, seated in [facility] with chair alarm on and all needs within reach.   Therapy Documentation Precautions:  Precautions Precautions: Fall Recall of Precautions/Restrictions: Intact Restrictions Weight Bearing Restrictions Per Provider Order: No    Therapy/Group: Individual Therapy  Vielka Klinedinst Woods-Chance, MS, OTR/L 09/25/2024, 7:50 AM

## 2024-09-26 DIAGNOSIS — I63011 Cerebral infarction due to thrombosis of right vertebral artery: Secondary | ICD-10-CM | POA: Diagnosis not present

## 2024-09-26 NOTE — Progress Notes (Signed)
 "                                                        PROGRESS NOTE   Subjective/Complaints:  No issues overnite   ROS- neg CP, SOB, N/V/D  Objective:   No results found. No results for input(s): WBC, HGB, HCT, PLT in the last 72 hours.  No results for input(s): NA, K, CL, CO2, GLUCOSE, BUN, CREATININE, CALCIUM  in the last 72 hours.   Intake/Output Summary (Last 24 hours) at 09/26/2024 0800 Last data filed at 09/26/2024 0205 Gross per 24 hour  Intake 827.5 ml  Output 850 ml  Net -22.5 ml        Physical Exam: Vital Signs Blood pressure 135/80, pulse 69, temperature 97.9 F (36.6 C), temperature source Oral, resp. rate 16, height 5' 7 (1.702 m), weight 92.6 kg, SpO2 97%.   General: No acute distress Mood and affect are appropriate Heart: Regular rate and rhythm no rubs murmurs or extra sounds Lungs: Clear to auscultation, breathing unlabored, no rales or wheezes Abdomen: Positive bowel sounds, soft nontender to palpation, nondistended Extremities: No clubbing, cyanosis, or edema Skin: No evidence of breakdown, no evidence of rash Neurologic: Cranial nerves II through XII intact, motor strength is 5/5 in bilateral deltoid, bicep, tricep, grip, hip flexor, knee extensors, ankle dorsiflexor and plantar flexor Sensory exam normal sensation to light touch  in bilateral upper and lower extremities Cerebellar exam mild dysmetria R FNF  Musculoskeletal: ~50% ankle PF/DF, inv/ev   Assessment/Plan: 1. Functional deficits which require 3+ hours per day of interdisciplinary therapy in a comprehensive inpatient rehab setting. Physiatrist is providing close team supervision and 24 hour management of active medical problems listed below. Physiatrist and rehab team continue to assess barriers to discharge/monitor patient progress toward functional and medical goals  Care Tool:  Bathing    Body parts bathed by patient: Right arm, Left arm, Chest, Abdomen,  Front perineal area, Right upper leg, Left upper leg, Face, Left lower leg   Body parts bathed by helper: Right lower leg, Buttocks     Bathing assist Assist Level: Moderate Assistance - Patient 50 - 74%     Upper Body Dressing/Undressing Upper body dressing   What is the patient wearing?: Pull over shirt    Upper body assist Assist Level: Set up assist    Lower Body Dressing/Undressing Lower body dressing      What is the patient wearing?: Underwear/pull up, Pants     Lower body assist Assist for lower body dressing: Moderate Assistance - Patient 50 - 74%     Toileting Toileting    Toileting assist Assist for toileting: Moderate Assistance - Patient 50 - 74%     Transfers Chair/bed transfer  Transfers assist     Chair/bed transfer assist level: Moderate Assistance - Patient 50 - 74%     Locomotion Ambulation   Ambulation assist      Assist level: Moderate Assistance - Patient 50 - 74% Assistive device: Walker-rolling Max distance: 100'   Walk 10 feet activity   Assist     Assist level: Moderate Assistance - Patient - 50 - 74% Assistive device: Walker-rolling   Walk 50 feet activity   Assist    Assist level: Moderate Assistance - Patient - 50 - 74% Assistive device: Walker-rolling  Walk 150 feet activity   Assist Walk 150 feet activity did not occur: Safety/medical concerns         Walk 10 feet on uneven surface  activity   Assist Walk 10 feet on uneven surfaces activity did not occur: Safety/medical concerns         Wheelchair     Assist Is the patient using a wheelchair?: Yes Type of Wheelchair: Manual    Wheelchair assist level: Minimal Assistance - Patient > 75% Max wheelchair distance: 125'    Wheelchair 50 feet with 2 turns activity    Assist        Assist Level: Minimal Assistance - Patient > 75%   Wheelchair 150 feet activity     Assist      Assist Level: Minimal Assistance - Patient >  75%   Blood pressure 135/80, pulse 69, temperature 97.9 F (36.6 C), temperature source Oral, resp. rate 16, height 5' 7 (1.702 m), weight 92.6 kg, SpO2 97%.   Medical Problem List and Plan: 1. 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  Antithrombotics: -DVT/anticoagulation:  Pharmaceutical: Lovenox              -antiplatelet therapy: ASA 3. Pain Management: Tylenol  prn 4. Mood/Behavior/Sleep: LCSW to follow for evaluation and support.              -antipsychotic agents: Seroquel  at bedtime (was increased at [facility]).             --continue Seroquel  and melatonin for sleep.  5. Neuropsych/cognition: This patient may be intermittently capable of making decisions on his own behalf. 6. Skin/Wound Care: Routine pressure relief measures. 7. Fluids/Electrolytes/Nutrition: Monitor I/O 8.  Right temporal lobe clival chordoma/radiation necrosis: Decadron  tapered to 2 mg daily since 12/21 9. GERD: On Protonix  10. AKI; BUN/Scr improving     Latest Ref Rng & Units 09/21/2024    4:31 AM 02/25/2024    3:29 AM 02/11/2024    1:32 PM  BMP  Glucose 70 - 99 mg/dL 92  889  88   BUN 8 - 23 mg/dL 24  24  25    Creatinine 0.61 - 1.24 mg/dL 9.06  8.94  8.85   Sodium 135 - 145 mmol/L 138  137  138   Potassium 3.5 - 5.1 mmol/L 4.3  4.2  4.8   Chloride 98 - 111 mmol/L 102  104  104   CO2 22 - 32 mmol/L 27  21  26    Calcium  8.9 - 10.3 mg/dL 9.3  8.6  9.6     11. Chronic right RTC injury:  12. Depression: On zoloft  100 mg daily 13.  Constipation: Has been refusing laxative. Will d/c miralax . Change senna to once daily if no BM in 24 hours.   14.  Bilateral ankle fusion limiting ability to compensate for stroke related balance deficit  LOS: 6 days A FACE TO FACE EVALUATION WAS PERFORMED  Justin Hurst 09/26/2024, 8:00 AM     "

## 2024-09-27 ENCOUNTER — Encounter: Payer: Self-pay | Admitting: Internal Medicine

## 2024-09-27 DIAGNOSIS — N179 Acute kidney failure, unspecified: Secondary | ICD-10-CM

## 2024-09-27 DIAGNOSIS — I63011 Cerebral infarction due to thrombosis of right vertebral artery: Secondary | ICD-10-CM | POA: Diagnosis not present

## 2024-09-27 DIAGNOSIS — K5901 Slow transit constipation: Secondary | ICD-10-CM

## 2024-09-27 LAB — CBC
HCT: 41.4 % (ref 39.0–52.0)
Hemoglobin: 13.9 g/dL (ref 13.0–17.0)
MCH: 31 pg (ref 26.0–34.0)
MCHC: 33.6 g/dL (ref 30.0–36.0)
MCV: 92.2 fL (ref 80.0–100.0)
Platelets: 266 K/uL (ref 150–400)
RBC: 4.49 MIL/uL (ref 4.22–5.81)
RDW: 13.7 % (ref 11.5–15.5)
WBC: 10.1 K/uL (ref 4.0–10.5)
nRBC: 0 % (ref 0.0–0.2)

## 2024-09-27 LAB — BASIC METABOLIC PANEL WITH GFR
Anion gap: 8 (ref 5–15)
BUN: 23 mg/dL (ref 8–23)
CO2: 27 mmol/L (ref 22–32)
Calcium: 9.2 mg/dL (ref 8.9–10.3)
Chloride: 102 mmol/L (ref 98–111)
Creatinine, Ser: 1 mg/dL (ref 0.61–1.24)
GFR, Estimated: >60 mL/min
Glucose, Bld: 103 mg/dL — ABNORMAL HIGH (ref 70–99)
Potassium: 5.1 mmol/L (ref 3.5–5.1)
Sodium: 137 mmol/L (ref 135–145)

## 2024-09-27 NOTE — Progress Notes (Signed)
 "                                                        PROGRESS NOTE   Subjective/Complaints:  Pt working in gym with PT. No new issues today. Right eye still a little irritated and blurry but seems to be improving with drops.  ROS: Patient denies fever, rash, sore throat,   dizziness, nausea, vomiting, diarrhea, cough, shortness of breath or chest pain, joint or back/neck pain, headache, or mood change.   Objective:   No results found. Recent Labs    09/27/24 0539  WBC 10.1  HGB 13.9  HCT 41.4  PLT 266    Recent Labs    09/27/24 0539  NA 137  K 5.1  CL 102  CO2 27  GLUCOSE 103*  BUN 23  CREATININE 1.00  CALCIUM  9.2     Intake/Output Summary (Last 24 hours) at 09/27/2024 0929 Last data filed at 09/27/2024 0857 Gross per 24 hour  Intake 358 ml  Output 2025 ml  Net -1667 ml        Physical Exam: Vital Signs Blood pressure 115/78, pulse 66, temperature 97.8 F (36.6 C), temperature source Oral, resp. rate 16, height 5' 7 (1.702 m), weight 92.6 kg, SpO2 94%.   Constitutional: No distress . Vital signs reviewed. HEENT: NCAT, EOMI, oral membranes moist. Right eye sl irritated/red Neck: supple Cardiovascular: RRR without murmur. No JVD    Respiratory/Chest: CTA Bilaterally without wheezes or rales. Normal effort    GI/Abdomen: BS +, non-tender, non-distended Ext: no clubbing, cyanosis, or edema Psych: pleasant and cooperative  Skin: No evidence of breakdown, no evidence of rash Neurologic: Cranial nerves II through XII intact, motor strength is 5/5 in bilateral deltoid, bicep, tricep, grip, hip flexor, knee extensors, ankle dorsiflexor and plantar flexor Sensory exam normal sensation to light touch  in bilateral upper and lower extremities Cerebellar exam deferred today. Musculoskeletal: ~50% ankle PF/DF, inv/ev--stable   Assessment/Plan: 1. Functional deficits which require 3+ hours per day of interdisciplinary therapy in a comprehensive inpatient rehab  setting. Physiatrist is providing close team supervision and 24 hour management of active medical problems listed below. Physiatrist and rehab team continue to assess barriers to discharge/monitor patient progress toward functional and medical goals  Care Tool:  Bathing    Body parts bathed by patient: Right arm, Left arm, Chest, Abdomen, Front perineal area, Right upper leg, Left upper leg, Face, Left lower leg   Body parts bathed by helper: Right lower leg, Buttocks     Bathing assist Assist Level: Moderate Assistance - Patient 50 - 74%     Upper Body Dressing/Undressing Upper body dressing   What is the patient wearing?: Pull over shirt    Upper body assist Assist Level: Set up assist    Lower Body Dressing/Undressing Lower body dressing      What is the patient wearing?: Underwear/pull up, Pants     Lower body assist Assist for lower body dressing: Moderate Assistance - Patient 50 - 74%     Toileting Toileting    Toileting assist Assist for toileting: Moderate Assistance - Patient 50 - 74%     Transfers Chair/bed transfer  Transfers assist     Chair/bed transfer assist level: Moderate Assistance - Patient 50 - 74%     Locomotion  Ambulation   Ambulation assist      Assist level: Moderate Assistance - Patient 50 - 74% Assistive device: Walker-rolling Max distance: 100'   Walk 10 feet activity   Assist     Assist level: Moderate Assistance - Patient - 50 - 74% Assistive device: Walker-rolling   Walk 50 feet activity   Assist    Assist level: Moderate Assistance - Patient - 50 - 74% Assistive device: Walker-rolling    Walk 150 feet activity   Assist Walk 150 feet activity did not occur: Safety/medical concerns         Walk 10 feet on uneven surface  activity   Assist Walk 10 feet on uneven surfaces activity did not occur: Safety/medical concerns         Wheelchair     Assist Is the patient using a wheelchair?:  Yes Type of Wheelchair: Manual    Wheelchair assist level: Minimal Assistance - Patient > 75% Max wheelchair distance: 125'    Wheelchair 50 feet with 2 turns activity    Assist        Assist Level: Minimal Assistance - Patient > 75%   Wheelchair 150 feet activity     Assist      Assist Level: Minimal Assistance - Patient > 75%   Blood pressure 115/78, pulse 66, temperature 97.8 F (36.6 C), temperature source Oral, resp. rate 16, height 5' 7 (1.702 m), weight 92.6 kg, SpO2 94%.   Medical Problem List and Plan: 1. 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  Antithrombotics: -DVT/anticoagulation:  Pharmaceutical: Lovenox              -antiplatelet therapy: ASA 3. Pain Management: Tylenol  prn 4. Mood/Behavior/Sleep: LCSW to follow for evaluation and support.              -antipsychotic agents: Seroquel  at bedtime (was increased at [facility]).             --continue Seroquel  and melatonin for sleep.  5. Neuropsych/cognition: This patient may be intermittently capable of making decisions on his own behalf. 6. Skin/Wound Care: Routine pressure relief measures. 7. Fluids/Electrolytes/Nutrition: Monitor I/O 8.  Right temporal lobe clival chordoma/radiation necrosis: Decadron  tapered to 2 mg daily since 12/21 9. GERD: On Protonix  10. AKI; BUN/Scr now within normal range    Latest Ref Rng & Units 09/27/2024    5:39 AM 09/21/2024    4:31 AM 02/25/2024    3:29 AM  BMP  Glucose 70 - 99 mg/dL 896  92  889   BUN 8 - 23 mg/dL 23  24  24    Creatinine 0.61 - 1.24 mg/dL 8.99  9.06  8.94   Sodium 135 - 145 mmol/L 137  138  137   Potassium 3.5 - 5.1 mmol/L 5.1  4.3  4.2   Chloride 98 - 111 mmol/L 102  102  104   CO2 22 - 32 mmol/L 27  27  21    Calcium  8.9 - 10.3 mg/dL 9.2  9.3  8.6     11. Chronic right RTC injury:  12.  Depression: On zoloft  100 mg daily 13.  Constipation: Has been refusing laxative. Dc'd miralax .  -had bm 1/4  14.  Bilateral ankle fusion limiting ability to  compensate for stroke related balance deficit  LOS: 7 days A FACE TO FACE EVALUATION WAS PERFORMED  Justin Hurst 09/27/2024, 9:29 AM     "

## 2024-09-27 NOTE — Progress Notes (Signed)
 Occupational Therapy Session Note  Patient Details  Name: Justin Hurst MRN: [id number] Date of Birth: Oct 02, 1955  Today's Date: 09/27/2024 OT Individual Time: 9163-9052 OT Individual Time Calculation (min): 71 min    Short Term Goals: Week 1:  OT Short Term Goal 1 (Week 1): patient will complete toilet transfer mod A with increased body awareness OT Short Term Goal 2 (Week 1): patient will demonstrate ability to complete UB dressing with increased dynamic balance with SUP sitting on EOB OT Short Term Goal 3 (Week 1): patient will complete LB dressing standing to pull pants up with min A with increased standing balance  Skilled Therapeutic Interventions/Progress Updates:  Pt greeted supine in bed, pt agreeable to OT intervention.      Transfers/bed mobility/functional mobility:  Pt completed supine>sit with supervision, squat pivot from EOB>w/c to R side with CGA. Pt completed sit.>stands with CGA throuhgout session.     ADLs:  Grooming: pt completed seated oral care at sink with supervision.   Footwear: donned shoes/socks from EOB with MAX A, pt did assist with pushing heel down into shoe.       NMR: pt completed below therex from supine on mat to facilitate improved strengthening in R hemi body and to promote NMRE: X10 Glute bridges X10 Glute bridges with chest presses 3lb weighted dowel x10Glute bridges with shoulder flexion with 3lb weigted dowel, increased difficulty with R hip elevation  X10 Glute bridges with yoga block at abductors to promote isometric control at abductors  Pt required Hamstring stretch with belt around R foot d/t reports of cramping  X10 Clam shells with and without resistance, x10 pulses  X10 resisted sit>stands from EOM to promote slow eccentric control and to maintain midline orientation.   Pt completed below standing balance task with a focus on improved standing balance, improved midline posture and to strength R hemi body. Pt instructed to  reach to mirror in specified sequence with RUE with theraband placed around RUE for strengthening. Focused on keeping midline posture during volitional reaching tasks, mirror provided for visual feedback. Pt needed MIN A - CGA to maintain midline posture with unilateral support.   Graded task up and off weighted R foot on 1 inch step to attempt to prohibit R hip from leaning to R side. This adjustment created a great challenge for pt as pt had to shift weight to L more needing MIN A for balance.   Worked on targeted steps with RLE without leaning R with support from Rw with pt continuing to lean R even with mirror provided. Provided max multimodal cues on importance of activating core with little change noted.                   Ended session with pt seated in w/c with all needs within reach and safety belt alarm activated.                    Therapy Documentation Precautions:  Precautions Precautions: Fall Recall of Precautions/Restrictions: Intact Restrictions Weight Bearing Restrictions Per Provider Order: No  Pain: No pain    Therapy/Group: Individual Therapy  Ronal Mallie Needy 09/27/2024, 12:10 PM

## 2024-09-27 NOTE — Progress Notes (Signed)
 Physical Therapy Session Note  Patient Details  Name: Justin Hurst MRN: [id number] Date of Birth: [birth year]  Today's Date: 09/27/2024 PT Individual Time: 8961-8875 PT Individual Time Calculation (min): 46 min   Short Term Goals: Week 1:  PT Short Term Goal 1 (Week 1): Pt will complete transfers CGA consistently PT Short Term Goal 2 (Week 1): Pt will ambulate 150' with LRAD with CGA PT Short Term Goal 3 (Week 1): Pt will complete up/down 12 steps with BHRs with CGA   Skilled Therapeutic Interventions/Progress Updates:  Patient seated upright in w/c on entrance to room. Patient alert and agreeable to PT session.   Patient with no pain complaint at start of session.  Pt transported dependently to main therapy gym via w/c. Pt demos ability to perform stand pivot transfer from w/c to  mat table toward R side but requires ModA to prevent fall to R side with significant lean upon rise from w/c.   Therapeutic Activity: Transfers: Pt performed sit<>stand and stand pivot transfers throughout session with ModA initially and improving in balance with decreased lean/ push to R side and overall CGA. Provided vc/ tc for RLE positioning and midline weight shift of pelvis for improved balance.  Neuromuscular Re-ed: NMR facilitated with focus on standing balance, midline orientation, motor control/ planning. Pt guided in: Sit<>stand technique with focus on midline, use of mirror for visual feedback to improve proprioception and ability to self-correct.  fwd/ bkwd stepping of RLE then seated rest followed by LLE. Focus on lateral weight shift, forward weight shift into lunge and then push back with bkwd step into stance. Cued to center to midline prior to each step. Noted need for wider BOS and step of RLE toward R front wheel of walker for improved BOS. Then able to self correct with diminishing vc throughout for crowding of R side of RW. Mirror used for recruitment consultant.   NMR performed  for improvements in motor control and coordination, balance, sequencing, judgement, and self confidence/ efficacy in performing all aspects of mobility at highest level of independence.   Gait Training:  Gait training initiated using RW with pt initially pushing to R side and requiring Mod/ MinA to maintain balance. Pt slowly improving throughout session following NMR as well as with cues for pt to perform posterior pelvic tilt for improved posture, RLE step more laterally toward front wheel of RW. Diminishing vc required by end of session for improvement in self correction of midline orientation. Overall improvement with wider BOS throughout.   Patient seated upright in w/c at end of session with brakes locked, seat pad alarm set, and all needs within reach.  Therapy Documentation Precautions:  Precautions Precautions: Fall Recall of Precautions/Restrictions: Intact Restrictions Weight Bearing Restrictions Per Provider Order: No  Pain: Pain Assessment Pain Scale: 0-10 Pain Score: 0-No pain   Therapy/Group: Individual Therapy  Mliss DELENA Milliner PT, DPT, CSRS 09/27/2024, 12:55 PM

## 2024-09-27 NOTE — Progress Notes (Signed)
 Physical Therapy Session Note  Patient Details  Name: Justin Hurst MRN: [id number] Date of Birth: 11/17/1955  Today's Date: 09/27/2024 PT Individual Time: 1356-1456 PT Individual Time Calculation (min): 60 min   Short Term Goals: Week 1:  PT Short Term Goal 1 (Week 1): Pt will complete transfers CGA consistently PT Short Term Goal 2 (Week 1): Pt will ambulate 150' with LRAD with CGA PT Short Term Goal 3 (Week 1): Pt will complete up/down 12 steps with BHRs with CGA   Skilled Therapeutic Interventions/Progress Updates:    Pt presents in bed and agreeable to session. Quick movement and comes to EOB with S. Performed squat pivot to the R to w/c from bed with ultimately mod A to control descent due to significant R lean once lifting off of the bed. Similar effect happened with transfer to the mat table despite cues for head hips relationship. Pt reporting feeling kind of off this afternoon reporting feeling somewhat dizzy at times, not being able to focus, and just off. Denies any new onset of weakness or changes in sensation or visual changes. BP values monitored for orthostatic assessment - see below. Pt able to complete session with rest breaks.  140/86 mmHg; HR = 84 bpm seated EOM 129/92 mmHg; HR 88 bpm seated EOM  145/91 mmHg; HR = 80 bpm in supine 122/94 mmHg; HR = 87 bpm seated EOB 142/97 mmHg' HR = 122 bpm standing   In supine during initial BP read performed supine bridging for functional strengthening and NMR for equal WB and muscle activation.   Focused on NMR to address balance, postural control retraining, R lateral lean and transitional movement.  Functional reaching task to place horseshoes from RW to basketball hoop with UE support with CGA for sit > stand and balance initially Increased challenge on compliant surface, repeated x 2 trials with same activity to place and remove horseshoes - noted RUE shaking in standing and increased R lateral lean but improved on second  trial with overall heavier min A when needing to correct Forward and retro gait x 10' with RW with CGA/min A forward and min to mod A for backwards. Second trial pt able to stay in straighter line with decreased R lean  Squat pivot transfers back to w.c and then back to bed at end of session with focus on technique, balance and postural control to the L with min A.   Repositioned in bed with all needs in reach and returned to supine. Bed alarm intact.     Therapy Documentation Precautions:  Precautions Precautions: Fall Recall of Precautions/Restrictions: Intact Restrictions Weight Bearing Restrictions Per Provider Order: No  Pain:  Denies pain. Reports discomfort in R eye. No interventions needed    Therapy/Group: Individual Therapy  [name] [name] [name] Elnor, PT, DPT, CBIS  09/27/2024, 3:01 PM

## 2024-09-27 NOTE — Progress Notes (Signed)
 Speech Language Pathology Daily Session Note  Patient Details  Name: Justin Hurst MRN: [id number] Date of Birth: 1955-10-31  Today's Date: 09/27/2024 SLP Individual Time: 1245-1255 SLP Individual Time Calculation (min): 10 min  Short Term Goals: Week 1: SLP Short Term Goal 1 (Week 1): Patient will complete pharyngeal strengthening exercises given supervision verbal A SLP Short Term Goal 2 (Week 1): Patient will demonstrate awareness of current medical situation given min verbal A SLP Short Term Goal 3 (Week 1): Patient will tolerate current diet with use of compensatory strategies given supervision A SLP Short Term Goal 4 (Week 1): Patient will complete functional problem solving tasks in daily situations given min verbal A  Skilled Therapeutic Interventions: Skilled therapy session focused on cognitive goals. SLP transferred patient to [facility] and ST office. Patient independently recalled activities of PT/OT this AM. SLP prompted patient to identify appropriate items to purchase for different age groups/genders. Patient listed 3/5 items before becoming extremely frustrated and refusing to complete further cognitive tasks. Patient requested return to room and refused further ST stating he did not want to do any cognitive activites anymore. If patient continues to refuse targeting goals, he would no longer be an appropriate candidate for ST. Patient left in bed with alarm set and call bell in reach. Remaining 35 minutes missed due to patient refusal and agitation.   Pain None reported   Therapy/Group: Individual Therapy  Helyne Genther M.A., CCC-SLP 09/27/2024, 7:45 AM

## 2024-09-28 DIAGNOSIS — G464 Cerebellar stroke syndrome: Secondary | ICD-10-CM | POA: Diagnosis not present

## 2024-09-28 DIAGNOSIS — I63011 Cerebral infarction due to thrombosis of right vertebral artery: Secondary | ICD-10-CM | POA: Diagnosis not present

## 2024-09-28 DIAGNOSIS — I6789 Other cerebrovascular disease: Secondary | ICD-10-CM | POA: Diagnosis not present

## 2024-09-28 DIAGNOSIS — Z981 Arthrodesis status: Secondary | ICD-10-CM | POA: Diagnosis not present

## 2024-09-28 NOTE — Progress Notes (Signed)
 Occupational Therapy Session Note  Patient Details  Name: Justin Hurst MRN: [id number] Date of Birth: 11/02/1955  Today's Date: 09/28/2024 OT Individual Time: 9254-9143 OT Individual Time Calculation (min): 71 min    Short Term Goals: Week 1:  OT Short Term Goal 1 (Week 1): patient will complete toilet transfer mod A with increased body awareness OT Short Term Goal 2 (Week 1): patient will demonstrate ability to complete UB dressing with increased dynamic balance with SUP sitting on EOB OT Short Term Goal 3 (Week 1): patient will complete LB dressing standing to pull pants up with min A with increased standing balance  Skilled Therapeutic Interventions/Progress Updates:    Skilled OT intervention with focus on bed mobility, sit<>stand, standing balance, functional transfers, toileting, dressing with sit<stand, standing balance, and activity tolerance to increase independence with BADLs. Supine>sit EOB with supervision. Squat pivot transfer to w/c with min A and max verbal cues for sequencing and safety. Dressing with sit<>stand from w/c. Pt used sock aid to assist with socks. Shoe aid used for shoes. Will require additional practice. Task difficult 2/2 bil ankle fusion. Pt requested to use toilet. Transfer to toilet using grab bars with CGA. Pt able to initiate cleaning after bowel movement but required assistance for thoroughness. Min A for pulling pants over hips. Pt compelted grooming seated in w/c at sink. Standing activity in gym, placing clothes pins on net with RUE. Pt with tendency to shift weight onto RLE with fatigue. Mod verbal cues to correct. No difficulty placing pins on net with RUE. Pt required rest breaks x 3 during tasks 2/2 RLE fatigue. Pt returned to room and reamined in w/c with all needs within reach. Nursing present. Belt alarm activated.   Therapy Documentation Precautions:  Precautions Precautions: Fall Recall of Precautions/Restrictions: Intact Restrictions Weight  Bearing Restrictions Per Provider Order: No Pain:  Pt denies pain this morning    Therapy/Group: Individual Therapy  Maritza Debby Mare 09/28/2024, 8:58 AM

## 2024-09-28 NOTE — Progress Notes (Signed)
 "                                                        PROGRESS NOTE   Subjective/Complaints:  RIght eye feels better noticing some increased numbness in right side face but no pain , no new balance or coordination issues   ROS: Patient denies fever, rash, sore throat,   dizziness, nausea, vomiting, diarrhea, cough, shortness of breath or chest pain, joint or back/neck pain, headache, or mood change.   Objective:   No results found. Recent Labs    09/27/24 0539  WBC 10.1  HGB 13.9  HCT 41.4  PLT 266    Recent Labs    09/27/24 0539  NA 137  K 5.1  CL 102  CO2 27  GLUCOSE 103*  BUN 23  CREATININE 1.00  CALCIUM  9.2     Intake/Output Summary (Last 24 hours) at 09/28/2024 0851 Last data filed at 09/28/2024 0809 Gross per 24 hour  Intake 830 ml  Output 725 ml  Net 105 ml        Physical Exam: Vital Signs Blood pressure 115/89, pulse 67, temperature 98.5 F (36.9 C), temperature source Oral, resp. rate 16, height 5' 7 (1.702 m), weight 92.6 kg, SpO2 96%.   General: No acute distress Mood and affect are appropriate Heart: Regular rate and rhythm no rubs murmurs or extra sounds Lungs: Clear to auscultation, breathing unlabored, no rales or wheezes Abdomen: Positive bowel sounds, soft nontender to palpation, nondistended Extremities: No clubbing, cyanosis, or edema Skin: No evidence of breakdown, no evidence of rash   Skin: No evidence of breakdown, no evidence of rash Neurologic: Cranial nerves II through XII intact, motor strength is 5/5 in bilateral deltoid, bicep, tricep, grip, hip flexor, knee extensors, ankle dorsiflexor and plantar flexor Sensory exam normal sensation to light touch  in bilateral upper and lower extremities Cerebellar exam deferred today. Musculoskeletal: ~50% ankle PF/DF, inv/ev--stable   Assessment/Plan: 1. Functional deficits which require 3+ hours per day of interdisciplinary therapy in a comprehensive inpatient rehab  setting. Physiatrist is providing close team supervision and 24 hour management of active medical problems listed below. Physiatrist and rehab team continue to assess barriers to discharge/monitor patient progress toward functional and medical goals  Care Tool:  Bathing    Body parts bathed by patient: Right arm, Left arm, Chest, Abdomen, Front perineal area, Right upper leg, Left upper leg, Face, Left lower leg   Body parts bathed by helper: Right lower leg, Buttocks     Bathing assist Assist Level: Moderate Assistance - Patient 50 - 74%     Upper Body Dressing/Undressing Upper body dressing   What is the patient wearing?: Pull over shirt    Upper body assist Assist Level: Set up assist    Lower Body Dressing/Undressing Lower body dressing      What is the patient wearing?: Underwear/pull up, Pants     Lower body assist Assist for lower body dressing: Moderate Assistance - Patient 50 - 74%     Toileting Toileting    Toileting assist Assist for toileting: Moderate Assistance - Patient 50 - 74%     Transfers Chair/bed transfer  Transfers assist     Chair/bed transfer assist level: Minimal Assistance - Patient > 75%     Locomotion Ambulation  Ambulation assist      Assist level: Moderate Assistance - Patient 50 - 74% Assistive device: Walker-rolling Max distance: 100'   Walk 10 feet activity   Assist     Assist level: Moderate Assistance - Patient - 50 - 74% Assistive device: Walker-rolling   Walk 50 feet activity   Assist    Assist level: Moderate Assistance - Patient - 50 - 74% Assistive device: Walker-rolling    Walk 150 feet activity   Assist Walk 150 feet activity did not occur: Safety/medical concerns         Walk 10 feet on uneven surface  activity   Assist Walk 10 feet on uneven surfaces activity did not occur: Safety/medical concerns         Wheelchair     Assist Is the patient using a wheelchair?: Yes Type  of Wheelchair: Manual    Wheelchair assist level: Minimal Assistance - Patient > 75% Max wheelchair distance: 125'    Wheelchair 50 feet with 2 turns activity    Assist        Assist Level: Minimal Assistance - Patient > 75%   Wheelchair 150 feet activity     Assist      Assist Level: Minimal Assistance - Patient > 75%   Blood pressure 115/89, pulse 67, temperature 98.5 F (36.9 C), temperature source Oral, resp. rate 16, height 5' 7 (1.702 m), weight 92.6 kg, SpO2 96%.   Medical Problem List and Plan: 1. Functional deficits secondary to Right medullary infarct- righ themiataxia, facial parasthesia , lateralpulsion all consistent with PICA syndrome             -patient may  shower             -ELOS/Goals: 10/09/23 supervision to min assist goals  -Continue CIR therapies including PT, OT  Team conf in am  2.  Antithrombotics: -DVT/anticoagulation:  Pharmaceutical: Lovenox              -antiplatelet therapy: ASA 3. Pain Management: Tylenol  prn 4. Mood/Behavior/Sleep: LCSW to follow for evaluation and support.              -antipsychotic agents: Seroquel  at bedtime (was increased at [facility]).             --continue Seroquel  and melatonin for sleep.  5. Neuropsych/cognition: This patient may be intermittently capable of making decisions on his own behalf. 6. Skin/Wound Care: Routine pressure relief measures. 7. Fluids/Electrolytes/Nutrition: Monitor I/O 8.  Right temporal lobe clival chordoma/radiation necrosis: Decadron  tapered to 2 mg daily since 12/21 9. GERD: On Protonix  10. AKI; BUN/Scr now within normal range    Latest Ref Rng & Units 09/27/2024    5:39 AM 09/21/2024    4:31 AM 02/25/2024    3:29 AM  BMP  Glucose 70 - 99 mg/dL 896  92  889   BUN 8 - 23 mg/dL 23  24  24    Creatinine 0.61 - 1.24 mg/dL 8.99  9.06  8.94   Sodium 135 - 145 mmol/L 137  138  137   Potassium 3.5 - 5.1 mmol/L 5.1  4.3  4.2   Chloride 98 - 111 mmol/L 102  102  104   CO2 22 - 32 mmol/L  27  27  21    Calcium  8.9 - 10.3 mg/dL 9.2  9.3  8.6     11. Chronic right RTC injury:  12. Depression: On zoloft  100 mg daily 13.  Constipation: Has been refusing laxative. Dc'd miralax .  -  had bm 1/4  14.  Bilateral ankle fusion limiting ability to compensate for stroke related balance deficit  LOS: 8 days A FACE TO FACE EVALUATION WAS PERFORMED  Justin Hurst 09/28/2024, 8:51 AM     "

## 2024-09-28 NOTE — Progress Notes (Signed)
 Physical Therapy Session Note  Patient Details  Name: Justin Hurst MRN: [id number] Date of Birth: March 09, 1956  Today's Date: 09/28/2024 PT Individual Time: 8993-8883; 1306 - 1402 PT Individual Time Calculation (min): 70 min; 56 min  Short Term Goals: Week 1:  PT Short Term Goal 1 (Week 1): Pt will complete transfers CGA consistently PT Short Term Goal 2 (Week 1): Pt will ambulate 150' with LRAD with CGA PT Short Term Goal 3 (Week 1): Pt will complete up/down 12 steps with BHRs with CGA  SESSION 1 Skilled Therapeutic Interventions/Progress Updates: Patient sitting in WC on entrance to room. Patient alert and agreeable to PT session.   Patient reported no pain and feeling better but still feels off a few moments during the day.   Therapeutic Activity: Transfers: Pt performed sit<>stand transfers throughout session with CGA for interventions. Pt performed step pivot from WC<corner wall during first intervention in NMRE with minA, and closer to modA when stepping back to [facility] to sit to the R. Pt cued for step placement and to increase awareness to bodily position as positional changes tends to decrease pt's proprioception.  Pt required return to room at end of session to void bladder via urinal. Pt did so with no incident or assistance.  Gait Training:  Pt ambulated roughly 60' x 2 using RW with modA. Pt cued to increase weight shift to L and to increase R step width to front wheel of RW due to R LE tendency to step towards midline. Pt improved standing balance and decrease R lean with adherence to cue. Pt has decreased R lean this session vs previous session with this PTA, but ultimately still present.   Neuromuscular Re-ed: NMR facilitated during session with focus on weight shift, proprioceptive feedback, dynamic standing balance, coordination. - Pt in corner (back and L side touching wall) with cue to lean anteriorly to the R to donn/doff squigs off of mirror. Pt cued throughout to lean  until back/hips and L side are not touching wall and onto R LE to WB, and then back to weight shift to L/posteriorly after doffing/doffing squig. Pt required overall minA/CGA and cues throughout to maintain sequence. Pt also required multimodal cuing to engage R quadriceps due to slight flexion presentation.  - Pt performed same task this time with PTA pulling pt into R/anterior lean with green theraband to further challenge weight shift correction. Pt overall min/light modA. Pt then cued to statically stay in corner with L side and back side touching wall while PTA pulled pt in R/anterior lean. Pt required cues throughout to maintain R quadriceps engagement.  - Pt in // bars with 5lb ankle weight donned R LE to assess if it would increase R lean (pt reported not being able to tell a difference). Pt with minA throughout and then cued to retro step. Pt cued to increase R LE step width for BOS increase as R LE tends to step towards midline. Pt with R lean but able to manage with B UE support. Pt required seated rest due to feeling out of whack in regards to proprioception. Pt performed 2nd round same cues with added one to stay on L side of // bar without touching barrier of taped down shuffle board sticks on R side of mat to ensure pt does not touch R bar to assist with R lean. Pt also cued to touch L side of bar each time R LE is going through swing phase. Pt with noted improvement in weight  shift to L with adherence to cue.  NMR performed for improvements in motor control and coordination, balance, sequencing, judgement, and self confidence/ efficacy in performing all aspects of mobility at highest level of independence.   Patient sitting in WC at end of session with brakes locked, belt alarm set, and all needs within reach.  SESSION 2 Skilled Therapeutic Interventions/Progress Updates: Patient sitting in WC on entrance to room. Patient alert and agreeable to PT session.   Patient reported no pain. Pt  reported he liked more of the walking aspect that was done previous session at the end vs dynamic/static standing balance. PTA encouraged pt to further discuss what pt enjoys and doesn't as this can affects pt's progress and overall morale during inpatient stay.  Therapeutic Activity: Bed Mobility: Pt performed sit<supine from on EOB with supervision. Transfers: Pt performed sit<>stand transfers throughout session with supervision to stand and min/modA to step pivot from WC<EOB at end of session.   Neuromuscular Re-ed: - Pt in // bars with tidal tank in R hand and L hand on bar. Pt retro stepped (shuffle board sticks on R side of mat to decrease pt's tendency to use R bar to catch R lean. Pt cued to lean to L hip to bar during stance phase to swing R LE through. Pt then cued to laterally step back to [facility] to the L with cue to increase weight shift to L. Pt seated rest break. Pt performed same task and this time cued to step laterally to the R back to chair and then needed heavy modA/maxA to safely get back to [facility] due to reports of feeling like B LE's were going to collapse - pt performed sit<>stand in // bar with cue to have R UE on L knee to improve weight shift to R. Pt then cued to reach to PTA hand on L side anteriorly to improve weight shift to L as well as to avoid touching PTA hand when sitting back down due to increase in R lean overall. Pt reported decreased ability to stand due to B LE fatigue. Pt transported to day room to perform therex on Kinetron.  NMR performed for improvements in motor control and coordination, balance, sequencing, judgement, and self confidence/ efficacy in performing all aspects of mobility at highest level of independence.   Therapeutic Exercise: Pt performed the following exercises with therapist providing the described cuing and facilitation for improvement. - Kinetron - 20 cm/sec first 4 rounds of 1 minute intervals, then 30 cm/sec last 2 rounds of 2 min intervals  with up to 1 minute break required between  Patient supine in bed at end of session with brakes locked, bed alarm set, and all needs within reach.       Therapy Documentation Precautions:  Precautions Precautions: Fall Recall of Precautions/Restrictions: Intact Restrictions Weight Bearing Restrictions Per Provider Order: No  Therapy/Group: Individual Therapy  Samanta Gal PTA 09/28/2024, 12:13 PM

## 2024-09-29 DIAGNOSIS — G464 Cerebellar stroke syndrome: Secondary | ICD-10-CM | POA: Diagnosis not present

## 2024-09-29 DIAGNOSIS — I6789 Other cerebrovascular disease: Secondary | ICD-10-CM | POA: Diagnosis not present

## 2024-09-29 DIAGNOSIS — I63011 Cerebral infarction due to thrombosis of right vertebral artery: Secondary | ICD-10-CM | POA: Diagnosis not present

## 2024-09-29 DIAGNOSIS — Z981 Arthrodesis status: Secondary | ICD-10-CM | POA: Diagnosis not present

## 2024-09-29 NOTE — Progress Notes (Signed)
 Physical Therapy Weekly Progress Note  Patient Details  Name: Justin Hurst MRN: [id number] Date of Birth: 1955-09-25  Beginning of progress report period: September 21, 2024 End of progress report period: September 30, 2023  Patient has met 0 of 3 short term goals. Pt is making slow progress towards LTG's of supervision with ambulation (currently requires closer to modA in RW due to R lean/poor proprioception/balance strategies and is not consistent with correcting deficit), transfers (getting closer to minA with RW but still requires cues for safe management of RW and to fully pivot and step placement of pivot), and is overall CGA with stairs but cannot tolerate more than 8 steps due to fatigue. Family education set to take place 1/10 and 1/14 with pt's wife.   Patient continues to demonstrate the following deficits muscle weakness and muscle joint tightness, decreased cardiorespiratoy endurance, unbalanced muscle activation and decreased coordination, decreased midline orientation, and decreased sitting balance, decreased standing balance, decreased postural control, and decreased balance strategies and therefore will continue to benefit from skilled PT intervention to increase functional independence with mobility.  Patient progressing toward long term goals..  Continue plan of care.  PT Short Term Goals Week 1:  PT Short Term Goal 1 (Week 1): Pt will complete transfers CGA consistently PT Short Term Goal 1 - Progress (Week 1): Progressing toward goal PT Short Term Goal 2 (Week 1): Pt will ambulate 150' with LRAD with CGA PT Short Term Goal 2 - Progress (Week 1): Progressing toward goal PT Short Term Goal 3 (Week 1): Pt will complete up/down 12 steps with BHRs with CGA PT Short Term Goal 3 - Progress (Week 1): Progressing toward goal Week 2: STG = LTGS  Skilled Therapeutic Interventions/Progress Updates:  Ambulation/gait training;Community reintegration;DME/adaptive equipment  instruction;Psychosocial support;UE/LE Strength taining/ROM;Stair training;Balance/vestibular training;Discharge planning;Therapeutic Activities;UE/LE Coordination activities;Cognitive remediation/compensation;Disease management/prevention;Functional mobility training;Patient/family education;Therapeutic Exercise;Skin care/wound management;Splinting/orthotics;Neuromuscular re-education;Pain management;Visual/perceptual remediation/compensation;Wheelchair propulsion/positioning   Therapy Documentation Precautions:  Precautions Precautions: Fall Recall of Precautions/Restrictions: Intact Restrictions Weight Bearing Restrictions Per Provider Order: No  Dominic A Sandoval 09/29/2024, 3:35 PM  Donald [name] Pereyra, PT, DPT, CBIS 09/30/2024

## 2024-09-29 NOTE — Progress Notes (Signed)
 Speech Language Pathology Discharge Summary  Patient Details  Name: Justin Hurst MRN: [id number] Date of Birth: 1956-07-25  Date of Discharge from SLP service:September 29, 2024  Today's Date: 09/29/2024 SLP Individual Time: 1100-1147 SLP Individual Time Calculation (min): 47 min and Today's Date: 09/29/2024 SLP Missed Time: 13 Minutes Missed Time Reason: Other (Comment)   Skilled Therapeutic Interventions:   Pt greeted at bedside for tx targeting cognition and dysphagia. During initial conversation, pt accurately recalled education provided in recent ST tx sessions as well as education provided from PT/OT re recent CVA. He independently demonstrated pharyngeal strengthening exercises introduced in prev tx session and SLP provided final education re MBSS results from [facility]. SLP then facilitated structured recall task. He immediately recalled 4/4 words. After a 5 min delay, he was able to recall 3/4 words. With the use of WRAP memory strategies, he recalled 4/4 words after an additional 15 min delay. Between recall times, he completed a money management task @ modI. Adequate processing time, problem solving, and working memory noted throughout task. During final education re mastery of ST goals and d/c from ST, he confirmed return to cognitive baseline. Additional questions were answered. At the end of tx tasks, he was left in his [facility] w/ the call light within reach.     Patient has met 3 of 3 long term goals.  Patient to discharge at overall Modified Independent level.  Reasons goals not met: n/a   Clinical Impression/Discharge Summary:  Pt made good progress this stay, as demonstrated by improved problem solving/reasoning, memory, awareness, and diet tolerance. He continues to tolerate a regular diet/thin liquids, implementing slow rate @ modI to optimize swallow safety. He demonstrates return to cognitive baseline and completes all tasks @ modI level. Recommend d/c from ST - no follow up needed  upon d/c from CIR.   Care Partner:  Caregiver Able to Provide Assistance: Yes  Type of Caregiver Assistance: Cognitive  Recommendation:  None      Equipment: n/a   Reasons for discharge: Treatment goals met   Patient/Family Agrees with Progress Made and Goals Achieved: Yes    [name] 09/29/2024, 4:40 PM

## 2024-09-29 NOTE — Progress Notes (Signed)
 Occupational Therapy Weekly Progress Note  Patient Details  Name: Justin Hurst MRN: [id number] Date of Birth: 1956/05/27  Beginning of progress report period: September 21, 2025 End of progress report period: September 29, 2024  Today's Date: 09/29/2024 OT Individual Time: 9169-9081 OT Individual Time Calculation (min): 48 min   Patient has met 3 of 3 short term goals.  Pt has been making strong progress with his self care and balance. Pt is now able to transfer w/c to bed, toilet, and tub bench in shower with light CGA  to close supervision.  Close S with most of his self care from sitting position.  His main challenge is with ambulation and stairs due to R lean.  He may have to be w/c level at home depending on his ambulation progress.   Patient continues to demonstrate the following deficits: decreased coordination, decreased midline orientation, and decreased standing balance, decreased postural control, hemiplegia, and decreased balance strategies and therefore will continue to benefit from skilled OT intervention to enhance overall performance with BADL.  Patient progressing toward long term goals.SABRA  LTGs upgraded.    Problem: RH Balance Goal: LTG Patient will maintain dynamic standing with ADLs (OT) Description: LTG:  Patient will maintain dynamic standing balance with assist during activities of daily living (OT)  Flowsheets (Taken 09/29/2024 1010) LTG: Pt will maintain dynamic standing balance during ADLs with: (Mod ind with standing at sink and toileting/LB dressing) Independent with assistive device Note: Mod ind with standing at sink and toileting/LB dressing  LTG upgraded due to progress.     Problem: Sit to Stand Goal: LTG:  Patient will perform sit to stand in prep for activites of daily living with assistance level (OT) Description: LTG:  Patient will perform sit to stand in prep for activites of daily living with assistance level (OT) Flowsheets (Taken 09/29/2024 1010) LTG:  PT will perform sit to stand in prep for activites of daily living with assistance level: (LTG upgraded due to progress.) Independent with assistive device Note: LTG upgraded due to progress.     Problem: RH Dressing Goal: LTG Patient will perform lower body dressing w/assist (OT) Description: LTG: Patient will perform lower body dressing with assist, with/without cues in positioning using equipment (OT) Flowsheets (Taken 09/29/2024 1010) LTG: Pt will perform lower body dressing with assistance level of: (LTG upgraded due to progress.) Independent with assistive device Note: LTG upgraded due to progress.     Problem: RH Toileting Goal: LTG Patient will perform toileting task (3/3 steps) with assistance level (OT) Description: LTG: Patient will perform toileting task (3/3 steps) with assistance level (OT)  Flowsheets (Taken 09/29/2024 1010) LTG: Pt will perform toileting task (3/3 steps) with assistance level: (LTG upgraded due to progress.) Independent with assistive device Note: LTG upgraded due to progress.     Problem: RH Light Housekeeping Goal: LTG Patient will perform light housekeeping w/assist (OT) Description: LTG: Patient will perform light housekeeping with assistance, with/without cues (OT). Flowsheets (Taken 09/29/2024 1010) LTG: Pt will perform light housekeeping with assistance level of: (LTG not applicable as his wife will perform light housekeeping.) -- Note: LTG not applicable as his wife will perform light housekeeping    Problem: RH Toilet Transfers Goal: LTG Patient will perform toilet transfers w/assist (OT) Description: LTG: Patient will perform toilet transfers with assist, with/without cues using equipment (OT) Flowsheets (Taken 09/29/2024 1010) LTG: Pt will perform toilet transfers with assistance level of: (LTG upgraded due to progress.  w/c to toilet transfers.) Independent with  assistive device Note: LTG upgraded due to progress. w/c to toilet transfers.     OT  Short Term Goals Week 1:  OT Short Term Goal 1 (Week 1): patient will complete toilet transfer mod A with increased body awareness OT Short Term Goal 1 - Progress (Week 1): Met OT Short Term Goal 2 (Week 1): patient will demonstrate ability to complete UB dressing with increased dynamic balance with SUP sitting on EOB OT Short Term Goal 2 - Progress (Week 1): Met OT Short Term Goal 3 (Week 1): patient will complete LB dressing standing to pull pants up with min A with increased standing balance OT Short Term Goal 3 - Progress (Week 1): Met Week 2:  OT Short Term Goal 1 (Week 2): Pt will complete w/c to toilet transfers with supervision. OT Short Term Goal 2 (Week 2): Pt will toilet with supervision. OT Short Term Goal 3 (Week 2): Pt will don clothing with distant S. OT Short Term Goal 4 (Week 2): Pt will stand and hold balance during self care at the sink without UE support with Supervision.  Skilled Therapeutic Interventions/Progress Updates:    Pt received in bed ready for therapy and agreeable to a shower.  Pt cued to do warm up R side AROM exercises as OT prepped bathroom.   Sat to EOB S -Trunk rotation exercise EOB with pt leaning R but self corrected with cues.  -CGA squat pivot to wc then to toilet with bar.   -CGA to stand with bar and then pt pulled LB clothing off with CGA for balance.  -Voided (documented in flow sheet)  - pt cleansed self and pivoted back to wc to then pivot to tub bench with bar.  -Doffed UB clothing and showered in sitting on tub bench using lateral leans.  -supervision with bar to transfer back to w/c -completed oral care, UB dressing independently from wc -pt was able to don underwear and pants over feet with cue to don R foot first. He came to stand at sink and pulled pants over hips.  Able to don socks without AE and shoes using shoe funnel his wife purchased.  Ended session with showing pt 4 exercises to do for his wrists and hands using the yellow  theratube his wife purchased.  Pt in wc with all needs met.   Therapy Documentation Precautions:  Precautions Precautions: Fall Recall of Precautions/Restrictions: Intact Restrictions Weight Bearing Restrictions Per Provider Order: No     Pain: Pain Assessment Pain Scale: 0-10 Pain Score: 0-No painNo c/o pain  ADL: ADL Equipment Provided: Reacher Eating: Set up Where Assessed-Eating: Chair Grooming: Independent Where Assessed-Grooming: Sitting at sink Upper Body Bathing: Supervision/safety Where Assessed-Upper Body Bathing: Shower Lower Body Bathing: Minimal assistance Where Assessed-Lower Body Bathing: Shower Upper Body Dressing: Independent Where Assessed-Upper Body Dressing: Wheelchair Lower Body Dressing: Supervision/safety, Minimal cueing Where Assessed-Lower Body Dressing: Wheelchair Toileting: Contact guard Where Assessed-Toileting: Teacher, Adult Education: Furniture Conservator/restorer Method: Ambulance Person: Acupuncturist: Administrator, Arts Method: Administrator: Emergency planning/management officer, Grab bars   Therapy/Group: Individual Therapy  Tanya Crothers 09/29/2024, 10:08 AM

## 2024-09-29 NOTE — Progress Notes (Signed)
 Physical Therapy Session Note  Patient Details  Name: Justin Hurst MRN: [id number] Date of Birth: 16-May-1956  Today's Date: 09/29/2024 PT Individual Time: 1410-1509 PT Individual Time Calculation (min): 59 min   Short Term Goals: Week 1:  PT Short Term Goal 1 (Week 1): Pt will complete transfers CGA consistently PT Short Term Goal 2 (Week 1): Pt will ambulate 150' with LRAD with CGA PT Short Term Goal 3 (Week 1): Pt will complete up/down 12 steps with BHRs with CGA  Skilled Therapeutic Interventions/Progress Updates: Patient sitting in WC on entrance to room. Patient alert and agreeable to PT session.   Patient reported no pain only some fatigue. PTA gave pt home measurement sheet and discussed pt's progress and that pt may have to navigate home environment with WC vs RW if R lean does not improve with gait as pt's wife also works few hours during the day.  Therapeutic Activity: Bed Mobility: Pt performed sit<supine from EOB modI with use of railing Transfers: Pt performed sit<>stand step transfer throughout session with RW and with min/heavy minA due to R lean. Pt required VC to ensure safe RW management and that pt performs full pivot before sitting by back of knees touching sitting surface.  - Pt navigated 8 (6) steps with B HR and cue to descend with R LE. Pt also cued to ensure entire foot on step (heel or front of foot touches base to avoid slip). Pt also cued to increase BOS. PTA encouraged pt to talk to wife about 2nd rail installation at home to enter home as pt only as one L ascending.  Wheelchair Mobility:  Pt propelled wheelchair from room<outside of main gym (100'+) with supervision and cue to increase R turn of wheel to avoid grazing into walls or doors  Neuromuscular Re-ed: MaxiSky system with Harness donned (green loop). Pt ambulated 2 laps in RW with supervision and no LOB to the R (enough slack given so pt does not rely on that assisting). Pt had some lean to R but not  as significant as previous trials with ambulation. Pt then without RW and ambulated down to mat and pivoted without and required seated rest due to fatigue. Pt ambulated without AD and cue to look forward ahead and to slowly look in turning direction when pivoting 180*. Pt required seated rest after 2 rounds and continued to present with R lean (no LOB to the R but still present with lean). Pt then ambulated with RW and with glasses donned with tape on lens to assess if pt's visual tracking is impending on pts dynamic standing balance which was not the case as pt demonstrated decrease in standing balance. Pt navigated 2 more laps in RW with increased ability to pivot following cue to find static object to focus on when turning. Pt still with R lean but and required 2 times of maxA to prevent R LOB and eventually needed seated rest break due to fatigue.   NMR performed for improvements in motor control and coordination, balance, sequencing, judgement, and self confidence/ efficacy in performing all aspects of mobility at highest level of independence.   Patient semi-reclined in bed at end of session with brakes locked, bed alarm set, and all needs within reach.      Therapy Documentation Precautions:  Precautions Precautions: Fall Recall of Precautions/Restrictions: Intact Restrictions Weight Bearing Restrictions Per Provider Order: No   Therapy/Group: Individual Therapy  Evony Rezek PTA 09/29/2024, 3:17 PM

## 2024-09-29 NOTE — Plan of Care (Signed)
" °  Problem: RH Balance Goal: LTG Patient will maintain dynamic standing with ADLs (OT) Description: LTG:  Patient will maintain dynamic standing balance with assist during activities of daily living (OT)  Flowsheets (Taken 09/29/2024 1010) LTG: Pt will maintain dynamic standing balance during ADLs with: (Mod ind with standing at sink and toileting/LB dressing) Independent with assistive device Note: Mod ind with standing at sink and toileting/LB dressing  LTG upgraded due to progress.     Problem: Sit to Stand Goal: LTG:  Patient will perform sit to stand in prep for activites of daily living with assistance level (OT) Description: LTG:  Patient will perform sit to stand in prep for activites of daily living with assistance level (OT) Flowsheets (Taken 09/29/2024 1010) LTG: PT will perform sit to stand in prep for activites of daily living with assistance level: (LTG upgraded due to progress.) Independent with assistive device Note: LTG upgraded due to progress.     Problem: RH Dressing Goal: LTG Patient will perform lower body dressing w/assist (OT) Description: LTG: Patient will perform lower body dressing with assist, with/without cues in positioning using equipment (OT) Flowsheets (Taken 09/29/2024 1010) LTG: Pt will perform lower body dressing with assistance level of: (LTG upgraded due to progress.) Independent with assistive device Note: LTG upgraded due to progress.     Problem: RH Toileting Goal: LTG Patient will perform toileting task (3/3 steps) with assistance level (OT) Description: LTG: Patient will perform toileting task (3/3 steps) with assistance level (OT)  Flowsheets (Taken 09/29/2024 1010) LTG: Pt will perform toileting task (3/3 steps) with assistance level: (LTG upgraded due to progress.) Independent with assistive device Note: LTG upgraded due to progress.     Problem: RH Light Housekeeping Goal: LTG Patient will perform light housekeeping w/assist (OT) Description: LTG:  Patient will perform light housekeeping with assistance, with/without cues (OT). Flowsheets (Taken 09/29/2024 1010) LTG: Pt will perform light housekeeping with assistance level of: (LTG not applicable as his wife will perform light housekeeping.) -- Note: LTG not applicable as his wife will perform light housekeeping    Problem: RH Toilet Transfers Goal: LTG Patient will perform toilet transfers w/assist (OT) Description: LTG: Patient will perform toilet transfers with assist, with/without cues using equipment (OT) Flowsheets (Taken 09/29/2024 1010) LTG: Pt will perform toilet transfers with assistance level of: (LTG upgraded due to progress.  w/c to toilet transfers.) Independent with assistive device Note: LTG upgraded due to progress. w/c to toilet transfers.    "

## 2024-09-29 NOTE — Patient Care Conference (Signed)
 Inpatient RehabilitationTeam Conference and Plan of Care Update Date: 09/29/2024   Time: 10:19 AM    Patient Name: Justin Hurst      Medical Record Number: [id number]  Date of Birth: 29-May-1956 Sex: Male         Room/Bed: 4W24C/4W24C-01 Payor Info: Payor: [employer] MEDICARE / Plan: [facility] MEDICARE / Product Type: *No Product type* /    Admit Date/Time:  09/20/2024  1:01 PM  Primary Diagnosis:  Stroke (cerebrum) [facility])  Hospital Problems: Principal Problem:   Stroke (cerebrum) [facility])    Expected Discharge Date: Expected Discharge Date: 10/08/24  Team Members Present: Physician leading conference: Dr. Prentice Compton Social Worker Present: Rhoda Clement, LCSW Nurse Present: Barnie Ronde, RN PT Present: Sherlean Perks, PT;Dominic Freddi, PTA OT Present: Julia Saguier, OT SLP Present: Recardo Cowing, SLP     Current Status/Progress Goal Weekly Team Focus  Bowel/Bladder   Pt is a continent of bowel and bladder with intermittent incontinence. Last BM 1/6   Pt will remain continent of bowel and bladder.   Meet toileting needs.    Swallow/Nutrition/ Hydration   regular/thin - intermittent throat clears at baseline   modI  pharyngeal strengthening, pt/family education    ADL's   UB self care-set up; toileting-min/mod A; LB dressing-min A with AE; mod A for donning shoes; transfers-min A; standing balance-min A with verbal cues for weight shifts   supervision overall   BADLs, standing balance, transfers, activity tolerance, education    Mobility   bed mobility = supervision; Transfers min/modA; Gait = light modA (R lean has improved especially when increasing R step width)   mostly supervision/modI  Barriers = R lean has improved but still present; focus = L weight shift, dynamic/static standing balance, gait, stairs, endurance    Communication                Safety/Cognition/ Behavioral Observations  mild deficits   supervision   cognitive retraining,  pt/family edu    Pain   No pain   pain score of 0   remains pain free    Skin   no wounds   Remains free of wounds  encourage patient to turn self in the bed and chair      Discharge Planning:  Home with wife and daughter to assist will need to confirm if can provide 24/7 care due to wife and daughter work. 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 Patient has trouble with static balance and shifting side to side with a right lean.   Patient on target to meet rehab goals: yes, currently needs CGA for showering and supervision for self care and toileting using an assistive device. Needs mod assist for ambulation and min assist to transfer to the left but has difficulty completing stand pivot transfers to the right due to truncal ataxia, right lean with fatigue and pushing with poor body awareness and proprioception deficits requiring a wide-base of support for mobility.  *See Care Plan and progress notes for long and short-term goals.   Revisions to Treatment Plan:  Step navigation trial Upgraded goals to mod I overall  Pharyngeal exercises  Teaching Needs: Safety, medications, transfers, toileting, etc.   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 Possible Resolutions to [employer] Focus: cont PT  training, work on endurance and balance   Continued Need for Acute Rehabilitation Level of Care: The patient requires daily medical management by a physician with specialized training in physical medicine and rehabilitation for the following reasons: Direction of a multidisciplinary physical rehabilitation program to maximize functional independence : Yes Medical management of patient stability for increased activity during participation in an intensive rehabilitation regime.: Yes Analysis of laboratory values and/or radiology reports with any subsequent need for medication adjustment and/or medical intervention. : Yes   I attest that I was present, lead the team conference, and concur with the assessment and plan of the team.   Fredericka Sober B 09/30/2024, 9:11 AM

## 2024-09-29 NOTE — Progress Notes (Signed)
 Patient ID: Justin Hurst, male   DOB: [birth year], 69 y.o.   MRN: [id number]  Met with pt and spoke with wife via telephone to discuss team conference goals of supervision-mod/I level and target discharge date of 1/16. Have scheduled wife to come in Sat form 10-12 to see in therapies and then again 1/14 from 2:00-4:00 to see prior to discharge. She wants to make sure she is comfortable and pt has what he needs. She reports if necessary she will quit work but wants to see if this is needed. She is aware of his high risk to fall due to the right lean he has. He has graduated from speech today and can focus now on his PT and OT goals. Pt will give wife measurement sheet when comes today to visit in case needs to be in a wheelchair at home while she is working. Will work on discharge needs and safest plan for pt

## 2024-09-29 NOTE — Plan of Care (Signed)
" °  Problem: Consults Goal: RH STROKE PATIENT EDUCATION Description: See Patient Education module for education specifics  Outcome: Progressing   Problem: RH BOWEL ELIMINATION Goal: RH STG MANAGE BOWEL WITH ASSISTANCE Description: STG Manage Bowel with mod I Assistance. Outcome: Progressing Goal: RH STG MANAGE BOWEL W/MEDICATION W/ASSISTANCE Description: STG Manage Bowel with Medication with mod I Assistance. Outcome: Progressing   Problem: RH SAFETY Goal: RH STG ADHERE TO SAFETY PRECAUTIONS W/ASSISTANCE/DEVICE Description: STG Adhere to Safety Precautions With cues Assistance/Device. Outcome: Progressing   Problem: RH KNOWLEDGE DEFICIT Goal: RH STG INCREASE KNOWLEDGE OF STROKE PROPHYLAXIS Description: Patient and wife will be able to manage secondary risk/preventative care using educational resources for medications and dietary modification independently Outcome: Progressing   "

## 2024-09-29 NOTE — Progress Notes (Signed)
 Occupational Therapy Session Note  Patient Details  Name: ROBERTLEE ROGACKI MRN: [id number] Date of Birth: 03-09-1956  Today's Date: 09/29/2024 OT Individual Time: 9066-9040 OT Individual Time Calculation (min): 26 min    Short Term Goals: Week 2:  OT Short Term Goal 1 (Week 2): Pt will complete w/c to toilet transfers with supervision. OT Short Term Goal 2 (Week 2): Pt will toilet with supervision. OT Short Term Goal 3 (Week 2): Pt will don clothing with distant S. OT Short Term Goal 4 (Week 2): Pt will stand and hold balance during self care at the sink without UE support with Supervision.  Skilled Therapeutic Interventions/Progress Updates:  Pt greeted seated in w/c, pt agreeable to OT intervention.      Transfers/bed mobility/functional mobility:  Pt engaged in blocked practice of Stand pivots from EOM > w/c with RW and CGA- MINA. Pt completed 5 reps with pt needing intermittent MIN A d/t R lean but more aware of deficit this session.  Pt reports he would like to do stand pivots mostly at home.   Practiced Forwards and backwards walking with RW to facilitate improved standing balance. Pt needed step by step cues to sequence gait/Rw pattern.   Therapeutic activity:  Pt completed dynamic balance task with pt instructed to Reach to R side to don rings over squigz to challenge standing balance. Pt completed task with CGA with no LOB.                  Ended session with pt seated in w/c with all needs within reach.                 Therapy Documentation Precautions:  Precautions Precautions: Fall Recall of Precautions/Restrictions: Intact Restrictions Weight Bearing Restrictions Per Provider Order: No  Pain: No pain    Therapy/Group: Individual Therapy  Ronal Gift [facility] 09/29/2024, 12:22 PM

## 2024-09-29 NOTE — Progress Notes (Signed)
 "                                                        PROGRESS NOTE   Subjective/Complaints:   No issues overnite, no pain c/os   ROS: Patient denies CP, SOB, N/V/D  Objective:   No results found. Recent Labs    09/27/24 0539  WBC 10.1  HGB 13.9  HCT 41.4  PLT 266    Recent Labs    09/27/24 0539  NA 137  K 5.1  CL 102  CO2 27  GLUCOSE 103*  BUN 23  CREATININE 1.00  CALCIUM  9.2     Intake/Output Summary (Last 24 hours) at 09/29/2024 0928 Last data filed at 09/29/2024 0836 Gross per 24 hour  Intake 118 ml  Output 1975 ml  Net -1857 ml        Physical Exam: Vital Signs Blood pressure 137/84, pulse 60, temperature 97.9 F (36.6 C), temperature source Oral, resp. rate 16, height 5' 7 (1.702 m), weight 92.6 kg, SpO2 96%.   General: No acute distress Mood and affect are appropriate Heart: Regular rate and rhythm no rubs murmurs or extra sounds Lungs: Clear to auscultation, breathing unlabored, no rales or wheezes Abdomen: Positive bowel sounds, soft nontender to palpation, nondistended Extremities: No clubbing, cyanosis, or edema Skin: No evidence of breakdown, no evidence of rash Neurologic: Cranial nerves II through XII intact, motor strength is 5/5 in bilateral deltoid, bicep, tricep, grip, hip flexor, knee extensors, ankle dorsiflexor and plantar flexor Sensory exam normal sensation to light touch  in bilateral upper and lower extremities Cerebellar exam mild dysmetria R FNF , Heel shin looks ok  Musculoskeletal: ~50% ankle PF/DF, inv/ev--stable   Assessment/Plan: 1. Functional deficits which require 3+ hours per day of interdisciplinary therapy in a comprehensive inpatient rehab setting. Physiatrist is providing close team supervision and 24 hour management of active medical problems listed below. Physiatrist and rehab team continue to assess barriers to discharge/monitor patient progress toward functional and medical goals  Care Tool:  Bathing     Body parts bathed by patient: Right arm, Left arm, Chest, Abdomen, Front perineal area, Right upper leg, Left upper leg, Face, Left lower leg, Buttocks   Body parts bathed by helper: Right lower leg     Bathing assist Assist Level: Minimal Assistance - Patient > 75%     Upper Body Dressing/Undressing Upper body dressing   What is the patient wearing?: Pull over shirt    Upper body assist Assist Level: Independent    Lower Body Dressing/Undressing Lower body dressing      What is the patient wearing?: Underwear/pull up, Pants     Lower body assist Assist for lower body dressing: Supervision/Verbal cueing     Toileting Toileting    Toileting assist Assist for toileting: Contact Guard/Touching assist     Transfers Chair/bed transfer  Transfers assist     Chair/bed transfer assist level: Minimal Assistance - Patient > 75%     Locomotion Ambulation   Ambulation assist      Assist level: Moderate Assistance - Patient 50 - 74% Assistive device: Walker-rolling Max distance: 100'   Walk 10 feet activity   Assist     Assist level: Moderate Assistance - Patient - 50 - 74% Assistive device: Walker-rolling   Walk 50 feet  activity   Assist    Assist level: Moderate Assistance - Patient - 50 - 74% Assistive device: Walker-rolling    Walk 150 feet activity   Assist Walk 150 feet activity did not occur: Safety/medical concerns         Walk 10 feet on uneven surface  activity   Assist Walk 10 feet on uneven surfaces activity did not occur: Safety/medical concerns         Wheelchair     Assist Is the patient using a wheelchair?: Yes Type of Wheelchair: Manual    Wheelchair assist level: Minimal Assistance - Patient > 75% Max wheelchair distance: 125'    Wheelchair 50 feet with 2 turns activity    Assist        Assist Level: Minimal Assistance - Patient > 75%   Wheelchair 150 feet activity     Assist      Assist  Level: Minimal Assistance - Patient > 75%   Blood pressure 137/84, pulse 60, temperature 97.9 F (36.6 C), temperature source Oral, resp. rate 16, height 5' 7 (1.702 m), weight 92.6 kg, SpO2 96%.   Medical Problem List and Plan: 1. Functional deficits secondary to Right medullary infarct- righ themiataxia, facial parasthesia , lateralpulsion all consistent with PICA syndrome             -patient may  shower             -ELOS/Goals: 10/09/23 supervision to min assist goals  -Continue CIR therapies including PT, OT  Team conference today please see physician documentation under team conference tab, met with team  to discuss problems,progress, and goals. Formulized individual treatment plan based on medical history, underlying problem and comorbidities.  2.  Antithrombotics: -DVT/anticoagulation:  Pharmaceutical: Lovenox              -antiplatelet therapy: ASA 3. Pain Management: Tylenol  prn 4. Mood/Behavior/Sleep: LCSW to follow for evaluation and support.              -antipsychotic agents: Seroquel  at bedtime (was increased at [facility]).             --continue Seroquel  and melatonin for sleep.  5. Neuropsych/cognition: This patient may be intermittently capable of making decisions on his own behalf. 6. Skin/Wound Care: Routine pressure relief measures. 7. Fluids/Electrolytes/Nutrition: Monitor I/O 8.  Right temporal lobe clival chordoma/radiation necrosis: Decadron  tapered to 2 mg daily since 12/21 9. GERD: On Protonix  10. AKI; BUN/Scr now within normal range    Latest Ref Rng & Units 09/27/2024    5:39 AM 09/21/2024    4:31 AM 02/25/2024    3:29 AM  BMP  Glucose 70 - 99 mg/dL 896  92  889   BUN 8 - 23 mg/dL 23  24  24    Creatinine 0.61 - 1.24 mg/dL 8.99  9.06  8.94   Sodium 135 - 145 mmol/L 137  138  137   Potassium 3.5 - 5.1 mmol/L 5.1  4.3  4.2   Chloride 98 - 111 mmol/L 102  102  104   CO2 22 - 32 mmol/L 27  27  21    Calcium  8.9 - 10.3 mg/dL 9.2  9.3  8.6     11. Chronic right  RTC injury:  12. Depression: On zoloft  100 mg daily 13.  Constipation: Has been refusing laxative. Dc'd miralax .  -had bm 1/4  14.  Bilateral ankle fusion limiting ability to compensate for stroke related balance deficit  LOS: 9 days A FACE  TO FACE EVALUATION WAS PERFORMED  Justin Hurst 09/29/2024, 9:28 AM     "

## 2024-09-29 NOTE — Plan of Care (Signed)
  Problem: RH Swallowing Goal: LTG Patient will consume least restrictive diet using compensatory strategies with assistance (SLP) Description: LTG:  Patient will consume least restrictive diet using compensatory strategies with assistance (SLP) Outcome: Completed/Met   Problem: RH Problem Solving Goal: LTG Patient will demonstrate problem solving for (SLP) Description: LTG:  Patient will demonstrate problem solving for basic/complex daily situations with cues  (SLP) Outcome: Completed/Met   Problem: RH Awareness Goal: LTG: Patient will demonstrate awareness during functional activites type of (SLP) Description: LTG: Patient will demonstrate awareness during functional activites type of (SLP) Outcome: Completed/Met

## 2024-09-30 ENCOUNTER — Inpatient Hospital Stay (HOSPITAL_COMMUNITY)

## 2024-09-30 DIAGNOSIS — I63011 Cerebral infarction due to thrombosis of right vertebral artery: Secondary | ICD-10-CM | POA: Diagnosis not present

## 2024-09-30 DIAGNOSIS — I6789 Other cerebrovascular disease: Secondary | ICD-10-CM | POA: Diagnosis not present

## 2024-09-30 DIAGNOSIS — Z981 Arthrodesis status: Secondary | ICD-10-CM | POA: Diagnosis not present

## 2024-09-30 DIAGNOSIS — G464 Cerebellar stroke syndrome: Secondary | ICD-10-CM | POA: Diagnosis not present

## 2024-09-30 LAB — CBC
HCT: 38.6 % — ABNORMAL LOW (ref 39.0–52.0)
Hemoglobin: 12.6 g/dL — ABNORMAL LOW (ref 13.0–17.0)
MCH: 30.6 pg (ref 26.0–34.0)
MCHC: 32.6 g/dL (ref 30.0–36.0)
MCV: 93.7 fL (ref 80.0–100.0)
Platelets: 246 K/uL (ref 150–400)
RBC: 4.12 MIL/uL — ABNORMAL LOW (ref 4.22–5.81)
RDW: 13.8 % (ref 11.5–15.5)
WBC: 8.5 K/uL (ref 4.0–10.5)
nRBC: 0 % (ref 0.0–0.2)

## 2024-09-30 LAB — BASIC METABOLIC PANEL WITH GFR
Anion gap: 9 (ref 5–15)
BUN: 25 mg/dL — ABNORMAL HIGH (ref 8–23)
CO2: 28 mmol/L (ref 22–32)
Calcium: 8.9 mg/dL (ref 8.9–10.3)
Chloride: 103 mmol/L (ref 98–111)
Creatinine, Ser: 0.96 mg/dL (ref 0.61–1.24)
GFR, Estimated: >60 mL/min
Glucose, Bld: 90 mg/dL (ref 70–99)
Potassium: 4.1 mmol/L (ref 3.5–5.1)
Sodium: 140 mmol/L (ref 135–145)

## 2024-09-30 MED ORDER — NAPHAZOLINE-GLYCERIN 0.012-0.25 % OP SOLN
1.0000 [drp] | Freq: Four times a day (QID) | OPHTHALMIC | Status: DC
  Administered 2024-09-30 – 2024-10-01 (×5): 2 [drp] via OPHTHALMIC
  Administered 2024-10-02 – 2024-10-03 (×5): 1 [drp] via OPHTHALMIC
  Administered 2024-10-03: 2 [drp] via OPHTHALMIC
  Administered 2024-10-03 (×2): 1 [drp] via OPHTHALMIC
  Administered 2024-10-04: 2 [drp] via OPHTHALMIC
  Administered 2024-10-04: 1 [drp] via OPHTHALMIC
  Administered 2024-10-04: 2 [drp] via OPHTHALMIC
  Administered 2024-10-05 – 2024-10-07 (×3): 1 [drp] via OPHTHALMIC
  Filled 2024-09-30: qty 15

## 2024-09-30 NOTE — Progress Notes (Signed)
 Patient reported vision changes this am with blurry vision. Was able to read Snellen chart at 20/30 per OT evaluation. Did report improved later but still not back to baseline. CT head ordered to evaluate for recurrence of edema.

## 2024-09-30 NOTE — Progress Notes (Signed)
 Occupational Therapy Session Note  Patient Details  Name: Justin Hurst MRN: [id number] Date of Birth: 08-24-1956  Today's Date: 09/30/2024 OT Individual Time: 9165-9054 OT Individual Time Calculation (min): 71 min    Short Term Goals: Week 1:  OT Short Term Goal 1 (Week 1): patient will complete toilet transfer mod A with increased body awareness OT Short Term Goal 1 - Progress (Week 1): Met OT Short Term Goal 2 (Week 1): patient will demonstrate ability to complete UB dressing with increased dynamic balance with SUP sitting on EOB OT Short Term Goal 2 - Progress (Week 1): Met OT Short Term Goal 3 (Week 1): patient will complete LB dressing standing to pull pants up with min A with increased standing balance OT Short Term Goal 3 - Progress (Week 1): Met Week 2:  OT Short Term Goal 1 (Week 2): Pt will complete w/c to toilet transfers with supervision. OT Short Term Goal 2 (Week 2): Pt will toilet with supervision. OT Short Term Goal 3 (Week 2): Pt will don clothing with distant S. OT Short Term Goal 4 (Week 2): Pt will stand and hold balance during self care at the sink without UE support with Supervision.  Skilled Therapeutic Interventions/Progress Updates:    Pt received in bed and ready for therapy. Declined need to shower as he had new pants on already.  From supine in bed worked on AROM warm up exercises for legs and torso, sat to EOB independently and then worked on torso twists.   Handed pt his socks shoes and shoe funnel and he donned without cues or A.  Squat pivot to wc to R. Pt initially leaning to R and not transferring safely. Cued him to look L and then he could pivot with S.   Using grab bars, stand pivot to toilet with close S.  Stood to pull pants down and sit without A.  Voided bowel and bladder 9 documented in flow sheet) and was able to self cleanse without A.  Stand pvt back to wc without A and use of bar.   Pt sat at sink to wash hands and face, pt rubbed  eyes with cloth to wash them out.   Pt self propelled wc to main gym navigating it well.  Worked on stand steps with RW to mat with min A and only trace R lean.  On mat with large mirror 12 ft from pt, pt stated he could not see himself clearly in mirror and the gym looked dark (lights on brightly).  Had pt cover R eye and he stated blurry vision in B eyes.  Used snellen chart at 20ft and pt able to clearly see letters at 20/30.   But he stated everything still was blurry. His PA came to gym and discussed vision with her.  (She explained these visual changes are on and off in his history with his brain tumor).  Pt worked on sit to stand focusing on midline control. Pt does best when initiating movement in B directions by leading with L hand from and to sitting surface.  Standing balance with support on RW with stepping R foot forward and back with cues for foot placement.  Seated trunk control exercises with moving torso in 360 motion and lateral weight shift.    Pt participated well and returned to wc and then to room to rest prior to next session.  All needs met.    Therapy Documentation Precautions:  Precautions Precautions: Fall Recall of Precautions/Restrictions:  Intact Restrictions Weight Bearing Restrictions Per Provider Order: No Pain: Pain Assessment Pain Score: 0-No pain ADL: ADL Equipment Provided: Reacher Eating: Set up Where Assessed-Eating: Chair Grooming: Independent Where Assessed-Grooming: Sitting at sink Upper Body Bathing: Supervision/safety Where Assessed-Upper Body Bathing: Shower Lower Body Bathing: Minimal assistance Where Assessed-Lower Body Bathing: Shower Upper Body Dressing: Independent Where Assessed-Upper Body Dressing: Wheelchair Lower Body Dressing: Setup Where Assessed-Lower Body Dressing: Wheelchair Toileting: Modified independent Where Assessed-Toileting: Teacher, Adult Education: Close supervision Toilet Transfer Method: Scientist, Product/process Development: Acupuncturist: Administrator, Arts Method: Administrator: Emergency planning/management officer, Grab bars    Therapy/Group: Individual Therapy  Waller Marcussen 09/30/2024, 12:18 PM

## 2024-09-30 NOTE — Progress Notes (Signed)
 Physical Therapy Session Note  Patient Details  Name: Justin Hurst MRN: [id number] Date of Birth: 21-Oct-1955  Today's Date: 09/30/2024 PT Individual Time: 1008-1110 PT Individual Time Calculation (min): 62 min  Today's Date: 09/30/2024 PT Missed Time: 13 Minutes Missed Time Reason: Patient fatigue  Short Term Goals: Week 1:  PT Short Term Goal 1 (Week 1): Pt will complete transfers CGA consistently PT Short Term Goal 1 - Progress (Week 1): Progressing toward goal PT Short Term Goal 2 (Week 1): Pt will ambulate 150' with LRAD with CGA PT Short Term Goal 2 - Progress (Week 1): Progressing toward goal PT Short Term Goal 3 (Week 1): Pt will complete up/down 12 steps with BHRs with CGA PT Short Term Goal 3 - Progress (Week 1): Progressing toward goal  Skilled Therapeutic Interventions/Progress Updates: Patient sitting in WC on entrance to room. Patient alert and agreeable to PT session.   Patient reported no pain  Therapeutic Activity: Bed Mobility: Pt performed sit<supine from EOB independently. Transfers: Pt performed sit<>stand transfers mass practice from WC<>edge of mat with yellow theraband donned and cue to increase step length (keeping tension on band) to improve BOS. Pt cued throughout to reach back with L UE to control descent vs R to avoid R lean increase, and to shift weight to L when standing. Pt also required multimodal cuing for safe management of RW and to ensure back of legs touch sitting surface. Pt required minA at first then progressed to close supervision when adhering to added cue to increase step width prior to stand.    Gait Training:  Pt ambulated roughly 100' in RW using mostly minA with moments of mod when increase in R lean occurred. Pt cued throughout to shift weight to the L and to increase R step width (towards front R wheel of RW) as pt improved standing balance when doing so. Pt required WC follow for safety and pt required seated rest due to  fatigue.  Neuromuscular Re-ed: NMR facilitated during session with focus on dynamic standing balance, coordination, proprioceptive feedback. - pt ambulating in // bar with yellow theraband donned B LE's with cue to maintain tension while ambulating to overcorrect narrow BOS tendency. Pt with close supervision during anterior step, and CGA with retro-step with cue to shift weight more towards heels to avoid increase in anterior lean. Pt then laterally stepped in bars with B UE supported and cue to maintain tension and to shift weight more to L per increase in R lean when stepping to L (CGA). Pt on [employer] balance system - postural stability training with pt demonstrating R sided favor on weight shift and a score of 48 while on static setting. Pt required seated rest and return to room due to B LE fatigue.   NMR performed for improvements in motor control and coordination, balance, sequencing, judgement, and self confidence/ efficacy in performing all aspects of mobility at highest level of independence.   Patient supine in bed at end of session with brakes locked, bed alarm set, and all needs within reach.  Therapy Documentation Precautions:  Precautions Precautions: Fall Recall of Precautions/Restrictions: Intact Restrictions Weight Bearing Restrictions Per Provider Order: No  Therapy/Group: Individual Therapy  Ani Deoliveira PTA 09/30/2024, 11:14 AM

## 2024-09-30 NOTE — Progress Notes (Signed)
 Physical Therapy Session Note  Patient Details  Name: Justin Hurst MRN: [id number] Date of Birth: Jun 18, 1956  Today's Date: 09/30/2024 PT Individual Time: 8564-8467 PT Individual Time Calculation (min): 57 min   Short Term Goals: Week 1:  PT Short Term Goal 1 (Week 1): Pt will complete transfers CGA consistently PT Short Term Goal 1 - Progress (Week 1): Progressing toward goal PT Short Term Goal 2 (Week 1): Pt will ambulate 150' with LRAD with CGA PT Short Term Goal 2 - Progress (Week 1): Progressing toward goal PT Short Term Goal 3 (Week 1): Pt will complete up/down 12 steps with BHRs with CGA PT Short Term Goal 3 - Progress (Week 1): Progressing toward goal  Skilled Therapeutic Interventions/Progress Updates:     Pt supine in bed upon arrival. Pt denies pain but endorses B LE soreness for therapy sessions. Agreeable to therapy. Session emphasized functional strengthening, standing balance/NMR, coordination, and sequencing with transfers as well as UE strengthening via WC propulsion. Pt performed supine to sit with S. Pt donned shoes with min A. Pt performed squat pivot transfer EOB to WC toward R with CGA overall and VC. Pt self-propelled WC ~275 ft to ortho gym with S. Pt practiced squat pivot transfers WC <> EOM toward L and R with SBA toward L and CGA toward R. Pt practiced stand pivot transfers WC <> EOM using RW toward L and R with CGA toward L and min A toward R - also required VC for wider BOS, remaining within RW, and sequencing. Minor LOB noted going toward R but pt able to recover. Pt self-propelled WC ortho gym to central elevator. PT then transported pt dependent in [facility] to courtyard outside [facility] to allow pt to get fresh air and sunlight for improved overall wellbeing. Seated in WC, pt performed 3x10 LAQ with DF in available range as well as 3x10 alt seated marching. Once back in room, pt returned to bed via stand step transfer using bed rail with CGA then to supine with S. Bed alarm  set and all needs in reach at end of session.  Therapy Documentation Precautions:  Precautions Precautions: Fall Recall of Precautions/Restrictions: Intact Restrictions Weight Bearing Restrictions Per Provider Order: No  Therapy/Group: Individual Therapy  Comer CHRISTELLA Levora Comer Levora, PT, DPT 09/30/2024, 7:53 AM

## 2024-09-30 NOTE — Progress Notes (Signed)
 "                                                        PROGRESS NOTE   Subjective/Complaints:   No issues overnite, no pain c/os  Reviewed and discussed labs   ROS: Patient denies CP, SOB, N/V/D  Objective:   No results found. Recent Labs    09/30/24 0543  WBC 8.5  HGB 12.6*  HCT 38.6*  PLT 246    Recent Labs    09/30/24 0543  NA 140  K 4.1  CL 103  CO2 28  GLUCOSE 90  BUN 25*  CREATININE 0.96  CALCIUM  8.9     Intake/Output Summary (Last 24 hours) at 09/30/2024 9187 Last data filed at 09/30/2024 0805 Gross per 24 hour  Intake 591 ml  Output 1150 ml  Net -559 ml        Physical Exam: Vital Signs Blood pressure (!) 111/58, pulse 66, temperature 98.3 F (36.8 C), temperature source Oral, resp. rate 18, height 5' 7 (1.702 m), weight 92.6 kg, SpO2 95%.   General: No acute distress Mood and affect are appropriate Heart: Regular rate and rhythm no rubs murmurs or extra sounds Lungs: Clear to auscultation, breathing unlabored, no rales or wheezes Abdomen: Positive bowel sounds, soft nontender to palpation, nondistended Extremities: No clubbing, cyanosis, or edema Skin: No evidence of breakdown, no evidence of rash Neurologic: Cranial nerves II through XII intact, motor strength is 5/5 in bilateral deltoid, bicep, tricep, grip, hip flexor, knee extensors, ankle dorsiflexor and plantar flexor Sensory exam normal sensation to light touch  in bilateral upper and lower extremities Cerebellar exam mild dysmetria R FNF , Heel shin looks ok  Musculoskeletal: ~50% ankle PF/DF, inv/ev--stable   Assessment/Plan: 1. Functional deficits which require 3+ hours per day of interdisciplinary therapy in a comprehensive inpatient rehab setting. Physiatrist is providing close team supervision and 24 hour management of active medical problems listed below. Physiatrist and rehab team continue to assess barriers to discharge/monitor patient progress toward functional and medical  goals  Care Tool:  Bathing    Body parts bathed by patient: Right arm, Left arm, Chest, Abdomen, Front perineal area, Right upper leg, Left upper leg, Face, Left lower leg, Buttocks   Body parts bathed by helper: Right lower leg     Bathing assist Assist Level: Minimal Assistance - Patient > 75%     Upper Body Dressing/Undressing Upper body dressing   What is the patient wearing?: Pull over shirt    Upper body assist Assist Level: Independent    Lower Body Dressing/Undressing Lower body dressing      What is the patient wearing?: Underwear/pull up, Pants     Lower body assist Assist for lower body dressing: Supervision/Verbal cueing     Toileting Toileting    Toileting assist Assist for toileting: Contact Guard/Touching assist     Transfers Chair/bed transfer  Transfers assist     Chair/bed transfer assist level: Minimal Assistance - Patient > 75%     Locomotion Ambulation   Ambulation assist      Assist level: Moderate Assistance - Patient 50 - 74% Assistive device: Walker-rolling Max distance: 100'   Walk 10 feet activity   Assist     Assist level: Moderate Assistance - Patient - 50 - 74% Assistive device:  Walker-rolling   Walk 50 feet activity   Assist    Assist level: Moderate Assistance - Patient - 50 - 74% Assistive device: Walker-rolling    Walk 150 feet activity   Assist Walk 150 feet activity did not occur: Safety/medical concerns         Walk 10 feet on uneven surface  activity   Assist Walk 10 feet on uneven surfaces activity did not occur: Safety/medical concerns         Wheelchair     Assist Is the patient using a wheelchair?: Yes Type of Wheelchair: Manual    Wheelchair assist level: Minimal Assistance - Patient > 75% Max wheelchair distance: 125'    Wheelchair 50 feet with 2 turns activity    Assist        Assist Level: Minimal Assistance - Patient > 75%   Wheelchair 150 feet activity      Assist      Assist Level: Minimal Assistance - Patient > 75%   Blood pressure (!) 111/58, pulse 66, temperature 98.3 F (36.8 C), temperature source Oral, resp. rate 18, height 5' 7 (1.702 m), weight 92.6 kg, SpO2 95%.   Medical Problem List and Plan: 1. Functional deficits secondary to Right medullary infarct- righ themiataxia, facial parasthesia , lateralpulsion all consistent with PICA syndrome             -patient may  shower             -ELOS/Goals: 10/09/23 supervision to min assist goals  -Continue CIR therapies including PT, OT    2.  Antithrombotics: -DVT/anticoagulation:  Pharmaceutical: Lovenox              -antiplatelet therapy: ASA 3. Pain Management: Tylenol  prn 4. Mood/Behavior/Sleep: LCSW to follow for evaluation and support.              -antipsychotic agents: Seroquel  at bedtime (was increased at [facility]).             --continue Seroquel  and melatonin for sleep.  5. Neuropsych/cognition: This patient may be intermittently capable of making decisions on his own behalf. 6. Skin/Wound Care: Routine pressure relief measures. 7. Fluids/Electrolytes/Nutrition: Monitor I/O 8.  Right temporal lobe clival chordoma/radiation necrosis: Decadron  tapered to 2 mg daily since 12/21 9. GERD: On Protonix  10. AKI; BUN/Scr now within normal range    Latest Ref Rng & Units 09/30/2024    5:43 AM 09/27/2024    5:39 AM 09/21/2024    4:31 AM  BMP  Glucose 70 - 99 mg/dL 90  896  92   BUN 8 - 23 mg/dL 25  23  24    Creatinine 0.61 - 1.24 mg/dL 9.03  8.99  9.06   Sodium 135 - 145 mmol/L 140  137  138   Potassium 3.5 - 5.1 mmol/L 4.1  5.1  4.3   Chloride 98 - 111 mmol/L 103  102  102   CO2 22 - 32 mmol/L 28  27  27    Calcium  8.9 - 10.3 mg/dL 8.9  9.2  9.3     11. Chronic right RTC injury:  12. Depression: On zoloft  100 mg daily 13.  Constipation: Has been refusing laxative. Dc'd miralax .  -had bm 1/4  14.  Bilateral ankle fusion limiting ability to compensate for stroke related  balance deficit  LOS: 10 days A FACE TO FACE EVALUATION WAS PERFORMED  Prentice FORBES Compton 09/30/2024, 8:12 AM     "

## 2024-10-01 DIAGNOSIS — I6789 Other cerebrovascular disease: Secondary | ICD-10-CM | POA: Diagnosis not present

## 2024-10-01 DIAGNOSIS — I63011 Cerebral infarction due to thrombosis of right vertebral artery: Secondary | ICD-10-CM | POA: Diagnosis not present

## 2024-10-01 DIAGNOSIS — Z981 Arthrodesis status: Secondary | ICD-10-CM | POA: Diagnosis not present

## 2024-10-01 DIAGNOSIS — G464 Cerebellar stroke syndrome: Secondary | ICD-10-CM | POA: Diagnosis not present

## 2024-10-01 DIAGNOSIS — G463 Brain stem stroke syndrome: Secondary | ICD-10-CM | POA: Diagnosis not present

## 2024-10-01 DIAGNOSIS — Y842 Radiological procedure and radiotherapy as the cause of abnormal reaction of the patient, or of later complication, without mention of misadventure at the time of the procedure: Secondary | ICD-10-CM | POA: Diagnosis not present

## 2024-10-01 NOTE — Progress Notes (Signed)
 Physical Therapy Session Note  Patient Details  Name: Justin Hurst MRN: [id number] Date of Birth: [birth year]  Today's Date: 10/01/2024 PT Individual Time: 1020-1116; 1304 - 1415 PT Individual Time Calculation (min): 56 min; 71 min   Short Term Goals: Week 2:  PT Short Term Goal 1 (Week 2): = LTGs  SESSION 1 Skilled Therapeutic Interventions/Progress Updates: Patient sitting in WC on entrance to room. Patient alert and agreeable to PT session.   Patient reported no pain  Therapeutic Activity: Transfers: Pt performed sit<>stand transfers throughout session with minA and cue to improve R step width to maintain standing balance in RW, as well as for hand placement.  Five times Sit to Stand Test (FTSS) Method: Use a straight back chair with a solid seat that is 16-18 high. Ask participant to sit on the chair with arms folded across their chest.   Instructions: Stand up and sit down as quickly as possible 5 times, keeping your arms folded across your chest.   Measurement: Stop timing when the participant stands the 5th time.   TIME: _22s_ (in seconds) - 26.19s - 21.76s - 18.06s   Times > 13.6 seconds is associated with increased disability and morbidity (Guralnik, 2000) Times > 15 seconds is predictive of recurrent falls in healthy individuals aged 16 and older (Buatois, et al., 2008) Normal performance values in community dwelling individuals aged 1 and older (Bohannon, 2006): 60-69 years: 11.4 seconds 70-79 years: 12.6 seconds 80-89 years: 14.8 seconds   MCID: >= 2.3 seconds for Vestibular Disorders (Meretta, 2006)  TUG (avg = 24.42s) in RW with overall CGA/light minA  - 31.02s needed cue to recall sitting when back at mat (light minA to prevent R LOB)  - 21.33s Pt improved stepping width on R LE during pivots to decrease risk of R LOB/lean  - 20.92s Pt sat at angle when back at mat vs full pivot due to decreased R step width leading to R lean  Neuromuscular  Re-ed: NMR facilitated during session with focus on dynamic standing balance, coordination, postural stability, weight shift. - Tossing bean bags to cornhole board 3 rounds without UE support. Pt cued to maintain slightly increased BOS with R step width. Overall pt required close supervision with added perturbations from PTA on gait bait with pt requiring modA to prevent anterior LOB. Pt required seated rest breaks.  - Pt ambulated 84' x 1 and 112' x 1 with RW and WC follow. Pt required frequent cuing to stop and reassess pt's BOS (to increase R step width), to decrease cadence and to maintain safe proximity to RW. Pt overall required minA with moments of modA to prevent R LOB and was able to be CGA/light minA when adhering to cues given. Pt required seated rest breaks due to LE fatigue.  NMR performed for improvements in motor control and coordination, balance, sequencing, judgement, and self confidence/ efficacy in performing all aspects of mobility at highest level of independence.   Patient sitting in WC at end of session with brakes locked, belt alarm set, and all needs within reach.  SESSION 2 Skilled Therapeutic Interventions/Progress Updates: Patient sitting in WC on entrance to room. Patient alert and agreeable to PT session.   Patient reported no pain  Therapeutic Activity: Bed Mobility: Pt performed sit<supine from EOB independently Transfers: Pt performed sit<>stand transfers throughout session with close supervision to stand and improved centered balance vs R lean. Pt modA to transfer from WC<EOB end of session R HHA due to pt have  flexed posture and knees and hips from fatigue.  - side stepping over hurdles in // bars (one round with one UE support with only yoga block with pt requiring closer to modA) to L and R with B UE support on railing with overall minA. Pt performed in order to simulate stepping over threshold at home to get into shower. Pt performed 2 rounds with 2nd round  added cue for pt to keep hands close together and to the L over side of hurdle to better simulate grab bar in shower at home. Pt bar vertical at home so 3rd round moved to stairs in main gym on vertical pole with 6 hurdle and cue for pt to hand B hands on pole and to step to the L and back to R. Pt required modA at first then minA/CGA after few times.   Therapeutic Exercise: Pt performed the following exercises with therapist providing the described cuing and facilitation for improvement. - Kinetron 2 min at 20 CM/SEC, then 2 min at 25 CM/SEC for 4 rounds with rest breaks between required and reported fatigue towards end.  Patient supine in bed at end of session with brakes locked, bed alarm set, and all needs within reach.       Therapy Documentation Precautions:  Precautions Precautions: Fall Recall of Precautions/Restrictions: Intact Restrictions Weight Bearing Restrictions Per Provider Order: No  Therapy/Group: Individual Therapy  Krissy Orebaugh PTA 10/01/2024, 12:43 PM

## 2024-10-01 NOTE — Progress Notes (Signed)
 Occupational Therapy Session Note  Patient Details  Name: Justin Hurst MRN: [id number] Date of Birth: September 05, 1956  Today's Date: 10/01/2024 OT Individual Time: 9166-9052 OT Individual Time Calculation (min): 74 min    Short Term Goals: Week 1:  OT Short Term Goal 1 (Week 1): patient will complete toilet transfer mod A with increased body awareness OT Short Term Goal 1 - Progress (Week 1): Met OT Short Term Goal 2 (Week 1): patient will demonstrate ability to complete UB dressing with increased dynamic balance with SUP sitting on EOB OT Short Term Goal 2 - Progress (Week 1): Met OT Short Term Goal 3 (Week 1): patient will complete LB dressing standing to pull pants up with min A with increased standing balance OT Short Term Goal 3 - Progress (Week 1): Met Week 2:  OT Short Term Goal 1 (Week 2): Pt will complete w/c to toilet transfers with supervision. OT Short Term Goal 2 (Week 2): Pt will toilet with supervision. OT Short Term Goal 3 (Week 2): Pt will don clothing with distant S. OT Short Term Goal 4 (Week 2): Pt will stand and hold balance during self care at the sink without UE support with Supervision.  Skilled Therapeutic Interventions/Progress Updates:    Pt received in bed ready for therapy. He stated his vision was feeling better today.   Sat up to EOB S and then completed a squad pivot to R side to wc S.  Had pt tell me where he was going to put his hands for support before he transferred and this helped him focus.   -pt showed me photos of his home bathroom set up. Will still need a measurement of Bathroom doorway. Current set up would require him to step sideways over a 6 -8 in threshold to his R without the ability to reach a grab bar until he is in the shower. Discussed how he could enter shower (placing shower seat back under shower head facing opposite way) turning his body so L side would enter the  shower that way he could use vertical bar that is on his back wall and  then once he is over threshold, switch L hand to horizontal bar.   Showed him an adhesive shower head holder that could be ordered to allow him to reach the shower head.    Pt may also need a small bar to L of toilet on out side wall.   Will also discuss with his wife for family ed tomorrow.    -toilet transfer wc to toilet with bar S -sit <> stand with bar S -pt managed clothing and cleansing post toileting without A (documented in flow sheet) - sit to stand with bar to RW and ambulated a few feet with turning 180 to his L to sit on tub bench CGA - bathed with long handled sponge with s/u A only -dried off and then ambulated out of bathroom to wc with RW and CGA, but then tried to sit too soon leaning toward his R  -cued him to fully rotate body to be lined up with chair -after short rest, he stepped to sink and stood to complete oral care and hair care - felt tired in legs after a few minutes but completed the tasks in standing -transferred to recliner to don socks and shoes with good alignment of body. -mod ind with socks and shoes using shoe funnel -pt rested for a few minutes and then worked on sit to stand from medical illustrator  to RW to ambulate 6 ft to sit in w/c for 3 full sets with the challenge of rotating his body 180 and lining up with sitting surface.   Pt did extremely well with task with no leaning and moved safely with very close S.    Resting in wc with all needs met.   Therapy Documentation Precautions:  Precautions Precautions: Fall Recall of Precautions/Restrictions: Intact Restrictions Weight Bearing Restrictions Per Provider Order: No   Pain: no c/o pain    ADL: ADL Equipment Provided: Reacher Eating: Set up Where Assessed-Eating: Chair Grooming: Independent Where Assessed-Grooming: Sitting at sink Upper Body Bathing: Supervision/safety Where Assessed-Upper Body Bathing: Shower Lower Body Bathing: Supervision/safety Where Assessed-Lower Body Bathing:  Shower Upper Body Dressing: Independent Where Assessed-Upper Body Dressing: Wheelchair Lower Body Dressing: Setup Where Assessed-Lower Body Dressing: Wheelchair Toileting: Modified independent Where Assessed-Toileting: Teacher, Adult Education: Close supervision Toilet Transfer Method: Surveyor, Minerals: Acupuncturist: Administrator, Arts Method: Administrator: Emergency planning/management officer, Grab bars    Therapy/Group: Individual Therapy  Elaine Middleton 10/01/2024, 9:54 AM

## 2024-10-01 NOTE — Progress Notes (Signed)
 "                                                        PROGRESS NOTE   Subjective/Complaints:  BLurriness of vision improved , discussed last day of ciloxan  drops  Discussed CT head results which are reassuring   ROS: Patient denies CP, SOB, N/V/D  Objective:   CT HEAD WO CONTRAST ( ) Result Date: 10/01/2024 EXAM: CT HEAD WITHOUT CONTRAST 09/30/2024 05:18:19 PM TECHNIQUE: CT of the head was performed without the administration of intravenous contrast. Automated exposure control, iterative reconstruction, and/or weight based adjustment of the mA/kV was utilized to reduce the radiation dose to as low as reasonably achievable. COMPARISON: None available. 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 Intermittent vision changes today--follow up on cerebral edema. FINDINGS: BRAIN AND VENTRICLES: In comparison across modalities to 09/06/24 MRI the edema in the right temporal lobe appears mildly improved. Additional moderate patchy white matter hypoattenuities are present compatible with chronic microvascular ischemic change. No acute hemorrhage. No evidence of acute infarct. No hydrocephalus. No extra-axial collection. No mass effect or midline shift. ORBITS: No acute abnormality. SINUSES: Postoperative changes of the clivus and posterior sphenoid sinus wall. SOFT TISSUES AND SKULL: No acute soft tissue abnormality. No skull fracture. IMPRESSION: 1. When comparing across modalities to 09/06/24 MRI the edema in the right temporal lobe appears mildly improved. Repeat MRI could provide more direct/confident comparison if clinically warranted. 2. No evidence of interval acute abnormality by CT. Electronically signed by: Gilmore Molt MD MD 10/01/2024 01:50 AM EST RP Workstation: [id number]   Recent Labs    09/30/24 0543  WBC 8.5  HGB 12.6*  HCT 38.6*  PLT 246    Recent Labs    09/30/24 0543  NA 140  K 4.1  CL 103  CO2 28  GLUCOSE 90  BUN 25*  CREATININE 0.96  CALCIUM   8.9     Intake/Output Summary (Last 24 hours) at 10/01/2024 0837 Last data filed at 10/01/2024 0735 Gross per 24 hour  Intake 118 ml  Output 1925 ml  Net -1807 ml        Physical Exam: Vital Signs Blood pressure 121/82, pulse 71, temperature 98 F (36.7 C), temperature source Oral, resp. rate 17, height 5' 7 (1.702 m), weight 92.6 kg, SpO2 95%.   General: No acute distress Mood and affect are appropriate Heart: Regular rate and rhythm no rubs murmurs or extra sounds Lungs: Clear to auscultation, breathing unlabored, no rales or wheezes Abdomen: Positive bowel sounds, soft nontender to palpation, nondistended Extremities: No clubbing, cyanosis, or edema Skin: No evidence of breakdown, no evidence of rash Neurologic: Cranial nerves II through XII intact, motor strength is 5/5 in bilateral deltoid, bicep, tricep, grip, hip flexor, knee extensors, ankle dorsiflexor and plantar flexor Sensory exam normal sensation to light touch  in bilateral upper and lower extremities Cerebellar exam mild dysmetria R FNF ,  Sitting balance is good at EOB Musculoskeletal: ~50% ankle PF/DF, inv/ev--stable   Assessment/Plan: 1. Functional deficits which require 3+ hours per day of interdisciplinary therapy in a comprehensive inpatient rehab setting. Physiatrist is providing close team supervision and 24 hour management of active medical problems listed below. Physiatrist and rehab team continue to assess barriers to discharge/monitor patient progress toward functional and medical goals  Care Tool:  Bathing    Body parts bathed by patient: Right arm, Left arm, Chest, Abdomen, Front perineal area, Right upper leg, Left upper leg, Face, Left lower leg, Buttocks   Body parts bathed by helper: Right lower leg     Bathing assist Assist Level: Minimal Assistance - Patient > 75%     Upper Body Dressing/Undressing Upper body dressing   What is the patient wearing?: Pull over shirt    Upper body  assist Assist Level: Independent    Lower Body Dressing/Undressing Lower body dressing      What is the patient wearing?: Underwear/pull up, Pants     Lower body assist Assist for lower body dressing: Supervision/Verbal cueing     Toileting Toileting    Toileting assist Assist for toileting: Independent with assistive device     Transfers Chair/bed transfer  Transfers assist     Chair/bed transfer assist level: Minimal Assistance - Patient > 75%     Locomotion Ambulation   Ambulation assist      Assist level: Moderate Assistance - Patient 50 - 74% Assistive device: Walker-rolling Max distance: 100'   Walk 10 feet activity   Assist     Assist level: Moderate Assistance - Patient - 50 - 74% Assistive device: Walker-rolling   Walk 50 feet activity   Assist    Assist level: Moderate Assistance - Patient - 50 - 74% Assistive device: Walker-rolling    Walk 150 feet activity   Assist Walk 150 feet activity did not occur: Safety/medical concerns         Walk 10 feet on uneven surface  activity   Assist Walk 10 feet on uneven surfaces activity did not occur: Safety/medical concerns         Wheelchair     Assist Is the patient using a wheelchair?: Yes Type of Wheelchair: Manual    Wheelchair assist level: Minimal Assistance - Patient > 75% Max wheelchair distance: 125'    Wheelchair 50 feet with 2 turns activity    Assist        Assist Level: Minimal Assistance - Patient > 75%   Wheelchair 150 feet activity     Assist      Assist Level: Minimal Assistance - Patient > 75%   Blood pressure 121/82, pulse 71, temperature 98 F (36.7 C), temperature source Oral, resp. rate 17, height 5' 7 (1.702 m), weight 92.6 kg, SpO2 95%.   Medical Problem List and Plan: 1. Functional deficits secondary to Right medullary infarct- righ themiataxia, facial parasthesia , lateralpulsion all consistent with PICA syndrome              -patient may  shower             -ELOS/Goals: 10/09/23 supervision to min assist goals  -Continue CIR therapies including PT, OT    2.  Antithrombotics: -DVT/anticoagulation:  Pharmaceutical: Lovenox              -antiplatelet therapy: ASA 3. Pain Management: Tylenol  prn 4. Mood/Behavior/Sleep: LCSW to follow for evaluation and support.              -antipsychotic agents: Seroquel  at bedtime (was increased at [facility]).             --continue Seroquel  and melatonin for sleep.  5. Neuropsych/cognition: This patient may be intermittently capable of making decisions on his own behalf. 6. Skin/Wound Care: Routine pressure relief measures. 7. Fluids/Electrolytes/Nutrition: Monitor I/O 8.  Right temporal lobe clival chordoma/radiation necrosis: Decadron  tapered to  2 mg daily since 12/21 Repeat CT head shows mild improvement with cerebral edema on lower dose of decadron   9. GERD: On Protonix  10. AKI; BUN/Scr now within normal range    Latest Ref Rng & Units 09/30/2024    5:43 AM 09/27/2024    5:39 AM 09/21/2024    4:31 AM  BMP  Glucose 70 - 99 mg/dL 90  896  92   BUN 8 - 23 mg/dL 25  23  24    Creatinine 0.61 - 1.24 mg/dL 9.03  8.99  9.06   Sodium 135 - 145 mmol/L 140  137  138   Potassium 3.5 - 5.1 mmol/L 4.1  5.1  4.3   Chloride 98 - 111 mmol/L 103  102  102   CO2 22 - 32 mmol/L 28  27  27    Calcium  8.9 - 10.3 mg/dL 8.9  9.2  9.3     11. Chronic right RTC injury:  12. Depression: On zoloft  100 mg daily 13.  Constipation: Has been refusing laxative. Dc'd miralax .  -had bm 1/4  14.  Bilateral ankle fusion limiting ability to compensate for stroke related balance deficit  LOS: 11 days A FACE TO FACE EVALUATION WAS PERFORMED  Justin Hurst 10/01/2024, 8:37 AM     "

## 2024-10-02 DIAGNOSIS — K5901 Slow transit constipation: Secondary | ICD-10-CM | POA: Diagnosis not present

## 2024-10-02 DIAGNOSIS — I63011 Cerebral infarction due to thrombosis of right vertebral artery: Secondary | ICD-10-CM | POA: Diagnosis not present

## 2024-10-02 DIAGNOSIS — N179 Acute kidney failure, unspecified: Secondary | ICD-10-CM | POA: Diagnosis not present

## 2024-10-02 NOTE — Progress Notes (Signed)
 Occupational Therapy Session Note  Patient Details  Name: Justin Hurst MRN: [id number] Date of Birth: Dec 03, 1955  Today's Date: 10/02/2024 OT Individual Time: 1115-1200 OT Individual Time Calculation (min): 45 min    Short Term Goals: Week 1:  OT Short Term Goal 1 (Week 1): patient will complete toilet transfer mod A with increased body awareness OT Short Term Goal 1 - Progress (Week 1): Met OT Short Term Goal 2 (Week 1): patient will demonstrate ability to complete UB dressing with increased dynamic balance with SUP sitting on EOB OT Short Term Goal 2 - Progress (Week 1): Met OT Short Term Goal 3 (Week 1): patient will complete LB dressing standing to pull pants up with min A with increased standing balance OT Short Term Goal 3 - Progress (Week 1): Met Week 2:  OT Short Term Goal 1 (Week 2): Pt will complete w/c to toilet transfers with supervision. OT Short Term Goal 2 (Week 2): Pt will toilet with supervision. OT Short Term Goal 3 (Week 2): Pt will don clothing with distant S. OT Short Term Goal 4 (Week 2): Pt will stand and hold balance during self care at the sink without UE support with Supervision.  Skilled Therapeutic Interventions/Progress Updates:    No c/o pain   Pt seen this session with his spouse for family education.  Pt participated well and pt's wife stated all of her concerns were addressed.    Pt practiced with demonstration: W/c >< toilet with grab bar RW >< toilet with bar with ambulation into toilet Sit to stands using safe mechanics Establishing safe balance before taking a step with RW  Discussed shower set up and my recommendations for positioning the shower seat, how he should use the bars to step into tub, positioning of hand held shower  Discussed that he should have 24 /7 S initially but if he is alone and needs to toilet to use wc to access bathroom as he is quite safe with those transfers. Discussed where to put up a bar for the toilet.  Reviewed  safe ambulation techniques and the importance of pt pausing and resetting if he begins to lean.    Reviewed his progress with self care and that most tasks are at a mod ind level from the wc.  When pt uses a walker he will need close S.  His bathroom door is 27 inches so a wc will fit in bathroom.  Discussed the need for a wc at home. Pt in an 18 inch wc but a 16 x16 would allow him more ease with navigating around furniture. Brought in a 16 x16 and pt transferred to that chair.  He stated it was comfortable and he could maneuver chair more easily.   Will check with him on Monday to confirm that is what he wants for home.   Pt resting in chair with all needs met.   Therapy Documentation Precautions:  Precautions Precautions: Fall Recall of Precautions/Restrictions: Intact Restrictions Weight Bearing Restrictions Per Provider Order: No    Therapy/Group: Individual Therapy  Ishani Goldwasser 10/02/2024, 12:58 PM

## 2024-10-02 NOTE — Progress Notes (Signed)
 "                                                        PROGRESS NOTE   Subjective/Complaints:  Pt slept well. No new complaints  ROS: Patient denies fever, rash, sore throat, blurred vision, dizziness, nausea, vomiting, diarrhea, cough, shortness of breath or chest pain, joint or back/neck pain, headache, or mood change.   Objective:   CT HEAD WO CONTRAST ( ) Result Date: 10/01/2024 EXAM: CT HEAD WITHOUT CONTRAST 09/30/2024 05:18:19 PM TECHNIQUE: CT of the head was performed without the administration of intravenous contrast. Automated exposure control, iterative reconstruction, and/or weight based adjustment of the mA/kV was utilized to reduce the radiation dose to as low as reasonably achievable. COMPARISON: None available. 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 Intermittent vision changes today--follow up on cerebral edema. FINDINGS: BRAIN AND VENTRICLES: In comparison across modalities to 09/06/24 MRI the edema in the right temporal lobe appears mildly improved. Additional moderate patchy white matter hypoattenuities are present compatible with chronic microvascular ischemic change. No acute hemorrhage. No evidence of acute infarct. No hydrocephalus. No extra-axial collection. No mass effect or midline shift. ORBITS: No acute abnormality. SINUSES: Postoperative changes of the clivus and posterior sphenoid sinus wall. SOFT TISSUES AND SKULL: No acute soft tissue abnormality. No skull fracture. IMPRESSION: 1. When comparing across modalities to 09/06/24 MRI the edema in the right temporal lobe appears mildly improved. Repeat MRI could provide more direct/confident comparison if clinically warranted. 2. No evidence of interval acute abnormality by CT. Electronically signed by: Gilmore Molt MD MD 10/01/2024 01:50 AM EST RP Workstation: [id number]   Recent Labs    09/30/24 0543  WBC 8.5  HGB 12.6*  HCT 38.6*  PLT 246    Recent Labs    09/30/24 0543  NA 140   K 4.1  CL 103  CO2 28  GLUCOSE 90  BUN 25*  CREATININE 0.96  CALCIUM  8.9     Intake/Output Summary (Last 24 hours) at 10/02/2024 1020 Last data filed at 10/02/2024 0823 Gross per 24 hour  Intake 472 ml  Output 1400 ml  Net -928 ml        Physical Exam: Vital Signs Blood pressure 121/82, pulse 68, temperature 98 F (36.7 C), resp. rate 18, height 5' 7 (1.702 m), weight 92.6 kg, SpO2 94%.   Constitutional: No distress . Vital signs reviewed. HEENT: NCAT, EOMI, oral membranes moist Neck: supple Cardiovascular: RRR without murmur. No JVD    Respiratory/Chest: CTA Bilaterally without wheezes or rales. Normal effort    GI/Abdomen: BS +, non-tender, non-distended Ext: no clubbing, cyanosis, or edema Psych: pleasant and cooperative  Skin: No evidence of breakdown, no evidence of rash Neurologic: Cranial nerves II through XII intact, motor strength is 5/5 in bilateral deltoid, bicep, tricep, grip, hip flexor, knee extensors, ankle dorsiflexor and plantar flexor Sensory exam normal sensation to light touch  in bilateral upper and lower extremities Cerebellar exam mild dysmetria R FNF ,  Sitting balance is good at EOB Musculoskeletal: ~50% ankle PF/DF, inv/ev--no changes   Assessment/Plan: 1. Functional deficits which require 3+ hours per day of interdisciplinary therapy in a comprehensive inpatient rehab setting. Physiatrist is providing close team supervision and 24 hour management of active medical problems listed below. Physiatrist and rehab team continue to  assess barriers to discharge/monitor patient progress toward functional and medical goals  Care Tool:  Bathing    Body parts bathed by patient: Right arm, Left arm, Chest, Abdomen, Front perineal area, Right upper leg, Left upper leg, Face, Left lower leg, Buttocks   Body parts bathed by helper: Right lower leg     Bathing assist Assist Level: Minimal Assistance - Patient > 75%     Upper Body  Dressing/Undressing Upper body dressing   What is the patient wearing?: Pull over shirt    Upper body assist Assist Level: Independent    Lower Body Dressing/Undressing Lower body dressing      What is the patient wearing?: Underwear/pull up, Pants     Lower body assist Assist for lower body dressing: Supervision/Verbal cueing     Toileting Toileting    Toileting assist Assist for toileting: Set up assist     Transfers Chair/bed transfer  Transfers assist     Chair/bed transfer assist level: Minimal Assistance - Patient > 75%     Locomotion Ambulation   Ambulation assist      Assist level: Moderate Assistance - Patient 50 - 74% Assistive device: Walker-rolling Max distance: 100'   Walk 10 feet activity   Assist     Assist level: Moderate Assistance - Patient - 50 - 74% Assistive device: Walker-rolling   Walk 50 feet activity   Assist    Assist level: Moderate Assistance - Patient - 50 - 74% Assistive device: Walker-rolling    Walk 150 feet activity   Assist Walk 150 feet activity did not occur: Safety/medical concerns         Walk 10 feet on uneven surface  activity   Assist Walk 10 feet on uneven surfaces activity did not occur: Safety/medical concerns         Wheelchair     Assist Is the patient using a wheelchair?: Yes Type of Wheelchair: Manual    Wheelchair assist level: Minimal Assistance - Patient > 75% Max wheelchair distance: 125'    Wheelchair 50 feet with 2 turns activity    Assist        Assist Level: Minimal Assistance - Patient > 75%   Wheelchair 150 feet activity     Assist      Assist Level: Minimal Assistance - Patient > 75%   Blood pressure 121/82, pulse 68, temperature 98 F (36.7 C), resp. rate 18, height 5' 7 (1.702 m), weight 92.6 kg, SpO2 94%.   Medical Problem List and Plan: 1. Functional deficits secondary to Right medullary infarct- righ themiataxia, facial parasthesia ,  lateralpulsion all consistent with PICA syndrome             -patient may  shower             -ELOS/Goals: 10/09/23 supervision to min assist goals  -Continue CIR therapies including PT, OT    2.  Antithrombotics: -DVT/anticoagulation:  Pharmaceutical: Lovenox              -antiplatelet therapy: ASA 3. Pain Management: Tylenol  prn 4. Mood/Behavior/Sleep: LCSW to follow for evaluation and support.              -antipsychotic agents: Seroquel  at bedtime (was increased at [facility]).             --continue Seroquel  and melatonin for sleep.  5. Neuropsych/cognition: This patient may be intermittently capable of making decisions on his own behalf. 6. Skin/Wound Care: Routine pressure relief measures. 7. Fluids/Electrolytes/Nutrition: Monitor I/O  8.  Right temporal lobe clival chordoma/radiation necrosis: Decadron  tapered to 2 mg daily since 12/21 Repeat CT head shows mild improvement with cerebral edema on lower dose of decadron   9. GERD: On Protonix  10. AKI; BUN/Scr now within normal range    Latest Ref Rng & Units 09/30/2024    5:43 AM 09/27/2024    5:39 AM 09/21/2024    4:31 AM  BMP  Glucose 70 - 99 mg/dL 90  896  92   BUN 8 - 23 mg/dL 25  23  24    Creatinine 0.61 - 1.24 mg/dL 9.03  8.99  9.06   Sodium 135 - 145 mmol/L 140  137  138   Potassium 3.5 - 5.1 mmol/L 4.1  5.1  4.3   Chloride 98 - 111 mmol/L 103  102  102   CO2 22 - 32 mmol/L 28  27  27    Calcium  8.9 - 10.3 mg/dL 8.9  9.2  9.3     11. Chronic right RTC injury:  12. Depression: On zoloft  100 mg daily 13.  Constipation: Has been refusing laxative. Dc'd miralax .  -had bm 1/4  14.  Bilateral ankle fusion limiting ability to compensate for stroke related balance deficit--really not much we can do about it at this point    LOS: 12 days A FACE TO FACE EVALUATION WAS PERFORMED  Arthea ONEIDA Gunther 10/02/2024, 10:20 AM     "

## 2024-10-02 NOTE — Progress Notes (Signed)
 Physical Therapy Session Note  Patient Details  Name: Justin Hurst MRN: [id number] Date of Birth: Sep 05, 1956  Today's Date: 10/02/2024 PT Individual Time: 1030-1109 PT Individual Time Calculation (min): 39 min   Short Term Goals: Week 1:  PT Short Term Goal 1 (Week 1): Pt will complete transfers CGA consistently PT Short Term Goal 1 - Progress (Week 1): Progressing toward goal PT Short Term Goal 2 (Week 1): Pt will ambulate 150' with LRAD with CGA PT Short Term Goal 2 - Progress (Week 1): Progressing toward goal PT Short Term Goal 3 (Week 1): Pt will complete up/down 12 steps with BHRs with CGA PT Short Term Goal 3 - Progress (Week 1): Progressing toward goal Week 2:  PT Short Term Goal 1 (Week 2): = LTGs  Skilled Therapeutic Interventions/Progress Updates:   Received pt sitting in [facility] with wife present for family education training. Pt agreeable to PT treatment and denied any pain during session. Session with emphasis on discharge planning, functional mobility/transfers, generalized strengthening and endurance, dynamic standing balance/coordination, ambulation, and stair navigation, and simulated car transfers. Pt performed all transfers with RW and CGA throughout session. Pt transported to/from room in [facility] dependently for time management purposes. Pt performed simulated car transfer using RW and CGA, then ambulated 64ft on uneven surfaces (ramp) with RW and CGA/min A with cues for eccentric control when turning and descending ramp. Ambulated 51ft to staircase and navigated 8 6in steps x 2 trials with L handrail and min A overall using a lateral stepping technique (trial 1: with therapist and trial 2: with wife) - pt reported weakness in RLE with fatigue but pt's wife demo good recall of education on body mechanics/positioning/hand placement with stair navigation  Pt then ambulated 139ft with RW and CGA provided by wife (min A with turning). Wife able to provide appropriate cues for safety with  increased R lateral lean. Discussed and educated pt/family on the following: - R hemiparesis and importance of remaining on pt's R side - caution of R lean especially when turning - recommendation to use RW with mobility  - use of gait belt with mobility  - getting HEP prior to discharge - having wife bring RW to top/bottom of stairs so RW is ready for pt with stair navigation - OPPT vs HHPT [name] reported plan to come for additional family education on Wednesday. Concluded session with pt sitting in [facility] with all needs within reach and wife at bedside.  Therapy Documentation Precautions:  Precautions Precautions: Fall Recall of Precautions/Restrictions: Intact Restrictions Weight Bearing Restrictions Per Provider Order: No  Therapy/Group: Individual Therapy Therisa HERO Zaunegger Therisa Stains PT, DPT 10/02/2024, 6:43 AM

## 2024-10-03 DIAGNOSIS — N179 Acute kidney failure, unspecified: Secondary | ICD-10-CM | POA: Diagnosis not present

## 2024-10-03 DIAGNOSIS — K5901 Slow transit constipation: Secondary | ICD-10-CM | POA: Diagnosis not present

## 2024-10-03 DIAGNOSIS — I63011 Cerebral infarction due to thrombosis of right vertebral artery: Secondary | ICD-10-CM | POA: Diagnosis not present

## 2024-10-03 NOTE — Progress Notes (Signed)
 Physical Therapy Session Note  Patient Details  Name: Justin Hurst MRN: [id number] Date of Birth: 11/04/1955  Today's Date: 10/03/2024 PT Individual Time: 1256-1356 PT Individual Time Calculation (min): 60 min   Short Term Goals: Week 1:  PT Short Term Goal 1 (Week 1): Pt will complete transfers CGA consistently PT Short Term Goal 1 - Progress (Week 1): Progressing toward goal PT Short Term Goal 2 (Week 1): Pt will ambulate 150' with LRAD with CGA PT Short Term Goal 2 - Progress (Week 1): Progressing toward goal PT Short Term Goal 3 (Week 1): Pt will complete up/down 12 steps with BHRs with CGA PT Short Term Goal 3 - Progress (Week 1): Progressing toward goal Week 2:  PT Short Term Goal 1 (Week 2): = LTGs  Skilled Therapeutic Interventions/Progress Updates:     Pt received sitting in wheelchair motivated to participate in PT. Pt denies pain and is agreeable to tx. Pt reports feeling shaky and contributes it to eating too quickly due to knowing he had PT at 1:00.  Therex: Pt wheeled to therapy room due to pt's being shaky initially. Gait with RW CGA with turns each direction x 126 ft, x 185 ft. Scooting laterally around mat for activation of glute med. STS x 10 CGA EOM. Ball roll supine up pt's knees with core activation x 20; supine oblique crunch x 10; SLR/hip circles x 20; bridge x 20; standing UE reaches for functional WB strengthening and bal challenge x 1 min with PT CGA for safety; SLR x 15; sidelying hip abduction x 20; bil LAQ x 15; seated core TE with ball leaning back as far as he could control and sitting up to neutral; wheelchair mobility Les only forward/backward x 125 ft x 3 bouts with PT manually applying resistance for further strengthening.  Pt returned to bed. All transfers CGA. No complaints of pain; bed alarm on; all needs within reach.   Therapy Documentation Precautions:  Precautions Precautions: Fall Recall of Precautions/Restrictions:  Intact Restrictions Weight Bearing Restrictions Per Provider Order: No   Therapy/Group: Individual Therapy  [name] 10/03/2024, 7:36 AM

## 2024-10-03 NOTE — Plan of Care (Signed)
" °  Problem: RH BOWEL ELIMINATION Goal: RH STG MANAGE BOWEL WITH ASSISTANCE Description: STG Manage Bowel with mod I Assistance. Outcome: Progressing Goal: RH STG MANAGE BOWEL W/MEDICATION W/ASSISTANCE Description: STG Manage Bowel with Medication with mod I Assistance. Outcome: Progressing   Problem: RH BLADDER ELIMINATION Goal: RH STG MANAGE BLADDER WITH ASSISTANCE Description: STG Manage Bladder With  toileting Assistance Outcome: Progressing   Problem: RH SAFETY Goal: RH STG ADHERE TO SAFETY PRECAUTIONS W/ASSISTANCE/DEVICE Description: STG Adhere to Safety Precautions With cues Assistance/Device. Outcome: Progressing   Problem: RH KNOWLEDGE DEFICIT Goal: RH STG INCREASE KNOWLEDGE OF STROKE PROPHYLAXIS Description: Patient and wife will be able to manage secondary risk/preventative care using educational resources for medications and dietary modification independently Outcome: Progressing   "

## 2024-10-03 NOTE — Progress Notes (Signed)
 "                                                        PROGRESS NOTE   Subjective/Complaints:  No new issues. Had another good night. Denies pain. Feels well. His right eye looked a little more irritated to me this am but he said it felt better.  ROS: Patient denies fever, rash, sore throat, blurred vision, dizziness, nausea, vomiting, diarrhea, cough, shortness of breath or chest pain, joint or back/neck pain, headache, or mood change.   Objective:   No results found.  No results for input(s): WBC, HGB, HCT, PLT in the last 72 hours.   No results for input(s): NA, K, CL, CO2, GLUCOSE, BUN, CREATININE, CALCIUM  in the last 72 hours.    Intake/Output Summary (Last 24 hours) at 10/03/2024 0908 Last data filed at 10/03/2024 0751 Gross per 24 hour  Intake 476 ml  Output 2250 ml  Net -1774 ml        Physical Exam: Vital Signs Blood pressure (!) 141/96, pulse 66, temperature 97.8 F (36.6 C), temperature source Oral, resp. rate 17, height 5' 7 (1.702 m), weight 92.6 kg, SpO2 96%.   Constitutional: No distress . Vital signs reviewed. HEENT: NCAT, EOMI, oral membranes moist. Sclera red OD Neck: supple Cardiovascular: RRR without murmur. No JVD    Respiratory/Chest: CTA Bilaterally without wheezes or rales. Normal effort    GI/Abdomen: BS +, non-tender, non-distended Ext: no clubbing, cyanosis, or edema Psych: pleasant and cooperative  Skin: No evidence of breakdown, no evidence of rash Neurologic: Cranial nerves II through XII intact, motor strength is 5/5 in bilateral deltoid, bicep, tricep, grip, hip flexor, knee extensors, ankle dorsiflexor and plantar flexor Sensory exam normal sensation to light touch  in bilateral upper and lower extremities Cerebellar exam mild dysmetria R FNF remains   Musculoskeletal: ~50% ankle PF/DF, inv/ev--no changes   Assessment/Plan: 1. Functional deficits which require 3+ hours per day of interdisciplinary therapy in  a comprehensive inpatient rehab setting. Physiatrist is providing close team supervision and 24 hour management of active medical problems listed below. Physiatrist and rehab team continue to assess barriers to discharge/monitor patient progress toward functional and medical goals  Care Tool:  Bathing    Body parts bathed by patient: Right arm, Left arm, Chest, Abdomen, Front perineal area, Right upper leg, Left upper leg, Face, Left lower leg, Buttocks   Body parts bathed by helper: Right lower leg     Bathing assist Assist Level: Minimal Assistance - Patient > 75%     Upper Body Dressing/Undressing Upper body dressing   What is the patient wearing?: Pull over shirt    Upper body assist Assist Level: Independent    Lower Body Dressing/Undressing Lower body dressing      What is the patient wearing?: Underwear/pull up, Pants     Lower body assist Assist for lower body dressing: Supervision/Verbal cueing     Toileting Toileting    Toileting assist Assist for toileting: Set up assist     Transfers Chair/bed transfer  Transfers assist     Chair/bed transfer assist level: Contact Guard/Touching assist     Locomotion Ambulation   Ambulation assist      Assist level: Minimal Assistance - Patient > 75% Assistive device: Walker-rolling Max distance: 100'   Walk  10 feet activity   Assist     Assist level: Minimal Assistance - Patient > 75% Assistive device: Walker-rolling   Walk 50 feet activity   Assist    Assist level: Minimal Assistance - Patient > 75% Assistive device: Walker-rolling    Walk 150 feet activity   Assist Walk 150 feet activity did not occur: Safety/medical concerns         Walk 10 feet on uneven surface  activity   Assist Walk 10 feet on uneven surfaces activity did not occur: Safety/medical concerns   Assist level: Minimal Assistance - Patient > 75% Assistive device: Walker-rolling   Wheelchair     Assist  Is the patient using a wheelchair?: Yes Type of Wheelchair: Manual    Wheelchair assist level: Minimal Assistance - Patient > 75% Max wheelchair distance: 125'    Wheelchair 50 feet with 2 turns activity    Assist        Assist Level: Minimal Assistance - Patient > 75%   Wheelchair 150 feet activity     Assist      Assist Level: Minimal Assistance - Patient > 75%   Blood pressure (!) 141/96, pulse 66, temperature 97.8 F (36.6 C), temperature source Oral, resp. rate 17, height 5' 7 (1.702 m), weight 92.6 kg, SpO2 96%.   Medical Problem List and Plan: 1. Functional deficits secondary to Right medullary infarct- righ themiataxia, facial parasthesia , lateralpulsion all consistent with PICA syndrome             -patient may  shower             -ELOS/Goals: 10/09/23 supervision to min assist goals  -Continue CIR therapies including PT, OT, and SLP     2.  Antithrombotics: -DVT/anticoagulation:  Pharmaceutical: Lovenox              -antiplatelet therapy: ASA 3. Pain Management: Tylenol  prn 4. Mood/Behavior/Sleep: LCSW to follow for evaluation and support.              -antipsychotic agents: Seroquel  at bedtime (was increased at [facility]             --doing well with Seroquel  and melatonin for sleep.  5. Neuropsych/cognition: This patient may be intermittently capable of making decisions on his own behalf. 6. Skin/Wound Care: Routine pressure relief measures. 7. Fluids/Electrolytes/Nutrition: Monitor I/O 8.  Right temporal lobe clival chordoma/radiation necrosis: Decadron  tapered to 2 mg daily since 12/21 Repeat CT head shows mild improvement with cerebral edema on lower dose of decadron   9. GERD: On Protonix  10. AKI; BUN/Scr now within normal range    Latest Ref Rng & Units 09/30/2024    5:43 AM 09/27/2024    5:39 AM 09/21/2024    4:31 AM  BMP  Glucose 70 - 99 mg/dL 90  896  92   BUN 8 - 23 mg/dL 25  23  24    Creatinine 0.61 - 1.24 mg/dL 9.03  8.99  9.06   Sodium  135 - 145 mmol/L 140  137  138   Potassium 3.5 - 5.1 mmol/L 4.1  5.1  4.3   Chloride 98 - 111 mmol/L 103  102  102   CO2 22 - 32 mmol/L 28  27  27    Calcium  8.9 - 10.3 mg/dL 8.9  9.2  9.3     11. Chronic right RTC injury:  12. Depression: On zoloft  100 mg daily 13.  Constipation: Has been refusing laxative. Dc'd miralax .  -had bm 1/4  14.  Bilateral ankle fusion limiting ability to compensate for stroke related balance deficit--really not much we can do about it at this point    LOS: 13 days A FACE TO FACE EVALUATION WAS PERFORMED  Arthea ONEIDA Gunther 10/03/2024, 9:08 AM     "

## 2024-10-04 DIAGNOSIS — G463 Brain stem stroke syndrome: Secondary | ICD-10-CM

## 2024-10-04 DIAGNOSIS — I63011 Cerebral infarction due to thrombosis of right vertebral artery: Secondary | ICD-10-CM | POA: Diagnosis not present

## 2024-10-04 LAB — CBC
HCT: 39.2 % (ref 39.0–52.0)
Hemoglobin: 12.9 g/dL — ABNORMAL LOW (ref 13.0–17.0)
MCH: 30.7 pg (ref 26.0–34.0)
MCHC: 32.9 g/dL (ref 30.0–36.0)
MCV: 93.3 fL (ref 80.0–100.0)
Platelets: 245 K/uL (ref 150–400)
RBC: 4.2 MIL/uL — ABNORMAL LOW (ref 4.22–5.81)
RDW: 13.9 % (ref 11.5–15.5)
WBC: 9.9 K/uL (ref 4.0–10.5)
nRBC: 0 % (ref 0.0–0.2)

## 2024-10-04 LAB — BASIC METABOLIC PANEL WITH GFR
Anion gap: 9 (ref 5–15)
BUN: 22 mg/dL (ref 8–23)
CO2: 27 mmol/L (ref 22–32)
Calcium: 8.8 mg/dL — ABNORMAL LOW (ref 8.9–10.3)
Chloride: 103 mmol/L (ref 98–111)
Creatinine, Ser: 0.93 mg/dL (ref 0.61–1.24)
GFR, Estimated: >60 mL/min
Glucose, Bld: 91 mg/dL (ref 70–99)
Potassium: 4.2 mmol/L (ref 3.5–5.1)
Sodium: 138 mmol/L (ref 135–145)

## 2024-10-04 NOTE — Progress Notes (Signed)
 Physical Therapy Session Note  Patient Details  Name: Justin Hurst MRN: [id number] Date of Birth: 1956/02/20  Today's Date: 10/04/2024 PT Individual Time: 8597-8542 PT Individual Time Calculation (min): 55 min   Short Term Goals: Week 2:  PT Short Term Goal 1 (Week 2): = LTGs  Skilled Therapeutic Interventions/Progress Updates:    Pt up in w/c agreeable to session. W/c mobility on unit with BLE/BUE combination for overall strengthening, endurance, and coordination around unit mod I. Performed stair negotiation training for home entry x 8 steps with L handrail with BUE support with overall min A for balance - cues for foot positioning for improved safety. CGA transfer to Nustep. Nustep with BUE/BLE on paced partner program (target steps per min at 80)  for reciprocal movement pattern retraining on level 3 x 20 min. Pt really enjoyed this activity. Gait with RW with focus on overall balance, step length, and posture > 200' with CGA to min A - due to at times keeping RW too far in front or RW rotating to the L with mild R lateral bias at times, cues for correction and awareness. Discussed DME (ordered 16 w/c with CSW) and progression. Pt asking about a lift chair as well. Discussed that PT would not recommend this as pt is able to perform sit <> stands with CGA and it is better for his overall strength/balance/endurance/mobility to do this actively. Pt in agreement and understanding. Returned to room and set up with all needs in reach.   Therapy Documentation Precautions:  Precautions Precautions: Fall Recall of Precautions/Restrictions: Intact Restrictions Weight Bearing Restrictions Per Provider Order: No    Pain:  Denies pain.    Therapy/Group: Individual Therapy  [name] [name] [name] Elnor, PT, DPT, CBIS  10/04/2024, 3:04 PM

## 2024-10-04 NOTE — Progress Notes (Signed)
 Patient ID: SEDALE Hurst, male   DOB: [birth year], 69 y.o.   MRN: [id number] Discussed with pt need for wheelchair and he is in agreement and have placed order to [employer] for wheelchair, has other DME from past admissions. Wife was here Sat for education and is coming back on Wednesday for more.

## 2024-10-04 NOTE — Progress Notes (Signed)
 Physical Therapy Session Note  Patient Details  Name: Justin Hurst MRN: [id number] Date of Birth: 1955/11/08  Today's Date: 10/04/2024 PT Individual Time: 8996-8888 PT Individual Time Calculation (min): 68 min   Short Term Goals: Week 1:  PT Short Term Goal 1 (Week 1): Pt will complete transfers CGA consistently PT Short Term Goal 1 - Progress (Week 1): Progressing toward goal PT Short Term Goal 2 (Week 1): Pt will ambulate 150' with LRAD with CGA PT Short Term Goal 2 - Progress (Week 1): Progressing toward goal PT Short Term Goal 3 (Week 1): Pt will complete up/down 12 steps with BHRs with CGA PT Short Term Goal 3 - Progress (Week 1): Progressing toward goal Week 2:  PT Short Term Goal 1 (Week 2): = LTGs  Skilled Therapeutic Interventions/Progress Updates:    Pt presents in w/c reporting overall increased fatigue today and general malaise. Functionally this carried over to affecting his balance and R lateral lean with upright mobility. Pt somewhat discouraged/frustrated - emotional support and encouragement provided. Pt propelled w/c to and from therapy session with mix of BLE and BUE for overall strengthening and coordination at mod I level. Performed CGA stand pivot transfer to the mat table with good technique. Focused on NMR for blocked practice sit <> stands without UE support with focus on controlled transitional movement, functional strengthening and overall balance x 5 reps and then x 10 reps while holding soft ball with full motion to coordinate with his weightshifts - overall CGA to min A needed with occasional min A needed for R lean/mild LOB. Pt difficulty with full upright posture at trunk/hips. Focused on supine stretching and NMR to address tight hip musculature and also address core strengthening. Issued HEP/hand out below - completed about 10-15 reps each of these exercises. Cues for technique and education provided on modification techniques.   Access Code: [email or web address] URL:  [email or web address] Date: 10/04/2024 Prepared by: Donald  Exercises - Supine Lower Trunk Rotation  - 1 x daily - 7 x weekly - 3 sets - 10 reps - Supine Figure 4 Piriformis Stretch  - 1 x daily - 7 x weekly - 3 sets - 10 reps - Supine March with Posterior Pelvic Tilt  with ball between knees and BLE at the same time - 1 x daily - 7 x weekly - 3 sets - 10 reps - Supine Bridge with ball between knees - 1 x daily - 7 x weekly - 3 sets - 10 reps   Pt performed bed mobility mod I on flat surface to transition between these exercises.   Gait with RW x 100' with CGA overall with RW but did demo mild R lateral lean and times with verbal cues to stop and reset - mild LOB when descending to mat to sit to the R. Discussed and educated on effect of fatigue on balance and overall safety at home.  NMR for dynamic standing balance, coordination and postural control retraining  toe taps to 4 step with BUE support, increased R lean with this activity and had to stop to reset or sit back down several occasions overall CGA to light min A In parallel bars completed lateral sidestepping with mirror for visual feedback to self correct lean x 2 reps each direction, then completed retro gait x 2 trials each about 10' with CGA overall in parallel bars as well  Returned to room and set up with all needs in reach.    Therapy Documentation Precautions:  Precautions Precautions:  Fall Recall of Precautions/Restrictions: Intact Restrictions Weight Bearing Restrictions Per Provider Order: No    Pain: Pain Assessment Pain Score: 0-No pain     Therapy/Group: Individual Therapy  [name] [name] [name] Elnor, PT, DPT, CBIS  10/04/2024, 12:09 PM

## 2024-10-04 NOTE — Progress Notes (Signed)
 "                                                        PROGRESS NOTE   Subjective/Complaints:  Big day in therapy yesterday , feels fatigued today   ROS: Patient denies CP, SOB, N/V/D  Objective:   No results found.  Recent Labs    10/04/24 0538  WBC 9.9  HGB 12.9*  HCT 39.2  PLT 245     Recent Labs    10/04/24 0538  NA 138  K 4.2  CL 103  CO2 27  GLUCOSE 91  BUN 22  CREATININE 0.93  CALCIUM  8.8*      Intake/Output Summary (Last 24 hours) at 10/04/2024 9071 Last data filed at 10/03/2024 2337 Gross per 24 hour  Intake 472 ml  Output 1000 ml  Net -528 ml        Physical Exam: Vital Signs Blood pressure 119/85, pulse 65, temperature 97.9 F (36.6 C), temperature source Oral, resp. rate 16, height 5' 7 (1.702 m), weight 92.6 kg, SpO2 94%.   General: No acute distress Mood and affect are appropriate Heart: Regular rate and rhythm no rubs murmurs or extra sounds Lungs: Clear to auscultation, breathing unlabored, no rales or wheezes Abdomen: Positive bowel sounds, soft nontender to palpation, nondistended  Skin: No evidence of breakdown, no evidence of rash Neurologic: Cranial nerves II through XII intact, motor strength is 5/5 in bilateral deltoid, bicep, tricep, grip, hip flexor, knee extensors, ankle dorsiflexor and plantar flexor Sensory exam normal sensation to light touch  in bilateral upper and lower extremities Cerebellar exam mild dysmetria R FNF remains   Musculoskeletal: ~50% ankle PF/DF, inv/ev--no changes   Assessment/Plan: 1. Functional deficits which require 3+ hours per day of interdisciplinary therapy in a comprehensive inpatient rehab setting. Physiatrist is providing close team supervision and 24 hour management of active medical problems listed below. Physiatrist and rehab team continue to assess barriers to discharge/monitor patient progress toward functional and medical goals  Care Tool:  Bathing    Body parts bathed by  patient: Right arm, Left arm, Chest, Abdomen, Front perineal area, Right upper leg, Left upper leg, Face, Left lower leg, Buttocks   Body parts bathed by helper: Right lower leg     Bathing assist Assist Level: Minimal Assistance - Patient > 75%     Upper Body Dressing/Undressing Upper body dressing   What is the patient wearing?: Pull over shirt    Upper body assist Assist Level: Independent    Lower Body Dressing/Undressing Lower body dressing      What is the patient wearing?: Underwear/pull up, Pants     Lower body assist Assist for lower body dressing: Supervision/Verbal cueing     Toileting Toileting    Toileting assist Assist for toileting: Set up assist     Transfers Chair/bed transfer  Transfers assist     Chair/bed transfer assist level: Contact Guard/Touching assist     Locomotion Ambulation   Ambulation assist      Assist level: Minimal Assistance - Patient > 75% Assistive device: Walker-rolling Max distance: 100'   Walk 10 feet activity   Assist     Assist level: Minimal Assistance - Patient > 75% Assistive device: Walker-rolling   Walk 50 feet activity   Assist    Assist  level: Minimal Assistance - Patient > 75% Assistive device: Walker-rolling    Walk 150 feet activity   Assist Walk 150 feet activity did not occur: Safety/medical concerns         Walk 10 feet on uneven surface  activity   Assist Walk 10 feet on uneven surfaces activity did not occur: Safety/medical concerns   Assist level: Minimal Assistance - Patient > 75% Assistive device: Walker-rolling   Wheelchair     Assist Is the patient using a wheelchair?: Yes Type of Wheelchair: Manual    Wheelchair assist level: Minimal Assistance - Patient > 75% Max wheelchair distance: 125'    Wheelchair 50 feet with 2 turns activity    Assist        Assist Level: Minimal Assistance - Patient > 75%   Wheelchair 150 feet activity      Assist      Assist Level: Minimal Assistance - Patient > 75%   Blood pressure 119/85, pulse 65, temperature 97.9 F (36.6 C), temperature source Oral, resp. rate 16, height 5' 7 (1.702 m), weight 92.6 kg, SpO2 94%.   Medical Problem List and Plan: 1. Functional deficits secondary to Right medullary infarct- righ themiataxia, facial parasthesia , lateralpulsion all consistent with PICA syndrome             -patient may  shower             -ELOS/Goals: 10/09/23 supervision to min assist goals  -Continue CIR therapies including PT, OT, and SLP   2.  Antithrombotics: -DVT/anticoagulation:  Pharmaceutical: Lovenox              -antiplatelet therapy: ASA 3. Pain Management: Tylenol  prn 4. Mood/Behavior/Sleep: LCSW to follow for evaluation and support.              -antipsychotic agents: Seroquel  at bedtime (was increased at [facility]).             --doing well with Seroquel  and melatonin for sleep.  5. Neuropsych/cognition: This patient may be intermittently capable of making decisions on his own behalf. 6. Skin/Wound Care: Routine pressure relief measures. 7. Fluids/Electrolytes/Nutrition: Monitor I/O 8.  Right temporal lobe clival chordoma/radiation necrosis: Decadron  tapered to 2 mg daily since 12/21 Repeat CT head shows mild improvement with cerebral edema on lower dose of decadron   9. GERD: On Protonix  10. AKI; BUN/Scr now within normal range    Latest Ref Rng & Units 10/04/2024    5:38 AM 09/30/2024    5:43 AM 09/27/2024    5:39 AM  BMP  Glucose 70 - 99 mg/dL 91  90  896   BUN 8 - 23 mg/dL 22  25  23    Creatinine 0.61 - 1.24 mg/dL 9.06  9.03  8.99   Sodium 135 - 145 mmol/L 138  140  137   Potassium 3.5 - 5.1 mmol/L 4.2  4.1  5.1   Chloride 98 - 111 mmol/L 103  103  102   CO2 22 - 32 mmol/L 27  28  27    Calcium  8.9 - 10.3 mg/dL 8.8  8.9  9.2     11. Chronic right RTC injury: OT/PT to address  12. Depression: On zoloft  100 mg daily 13.  Constipation: Has been refusing  laxative. Dc'd miralax .  -had bm 1/4  14.  Bilateral ankle fusion limiting ability to compensate for stroke related balance deficit--   LOS: 14 days A FACE TO FACE EVALUATION WAS PERFORMED  Justin Hurst 10/04/2024, 9:28 AM     "

## 2024-10-04 NOTE — Progress Notes (Signed)
 Occupational Therapy Session Note  Patient Details  Name: Justin Hurst MRN: [id number] Date of Birth: 09-05-1956  Today's Date: 10/04/2024 OT Individual Time: 9169-9059 OT Individual Time Calculation (min): 70 min    Short Term Goals: Week 2:  OT Short Term Goal 1 (Week 2): Pt will complete w/c to toilet transfers with supervision. OT Short Term Goal 2 (Week 2): Pt will toilet with supervision. OT Short Term Goal 3 (Week 2): Pt will don clothing with distant S. OT Short Term Goal 4 (Week 2): Pt will stand and hold balance during self care at the sink without UE support with Supervision.  Skilled Therapeutic Interventions/Progress Updates:      Pt seen for BADL retraining of toileting, bathing, and dressing with a focus on   Received in bed ready for therapy. Had pt sit to EOB and complete warm up exercises of AROM of trunk and legs.  Practiced sit to stands from EOB without RW 3x - on 2nd trial posterior lean with min A to stedy. Cued pt to focus on distributing wt through whole foot and he was successful 3rd time. On 4th trial, passed him RW to use to ambulate to toilet.  Had to cue pt to pause 3x when he began to lean R and reset his balance. Sat to toilet but no void needed. Pt doffed clothing and then used RW to ambulate 5 ft to shower bench.  Set up with supplies and pt completed shower.  Cued to dry off thoroughly to prevent falls post shower.   Ambulated from shower to EOB with improved balance and not R lean.   Once at EOB needed cues to slow down between each step of dressing tasks.   With pulling up underwear and then pants, pt had 2 leans to the R with a safe planned error to allow him to fall to the R with therapist guarding him.  Reviewed and then had him practice sit to stand with control NOT using the RW to ensure he is centered before pulling up his pants.   Pt continues to only need S with LB dressing but needs the verbal cues for positioning to ensure  safety.  Had pt practice walking from bed to wc in hallway with RW with occasional R lean, cue to correct , which pt did.  He stated he was really sore and tired from Sunday therapy. Pt did appear more tired than usual.   Chose to not continue working on ambulation until his next PT session.  Used feet to self propel his wc to gym to work hamstrings. Engaged in UE exercises with bar.  Tried wc push up but too painful on his shoulders due to old dislocation injury.  Pt returned to room.  All needs met.   Therapy Documentation Precautions:  Precautions Precautions: Fall Recall of Precautions/Restrictions: Intact Restrictions Weight Bearing Restrictions Per Provider Order: No   Pain: Pain Assessment Pain Scale: 0-10 Pain Score: 0-No pain ADL: ADL Equipment Provided: Reacher Eating: Set up Where Assessed-Eating: Chair Grooming: Independent Where Assessed-Grooming: Sitting at sink Upper Body Bathing: Supervision/safety Where Assessed-Upper Body Bathing: Shower Lower Body Bathing: Supervision/safety Where Assessed-Lower Body Bathing: Shower Upper Body Dressing: Independent Where Assessed-Upper Body Dressing: Wheelchair Lower Body Dressing: Setup Where Assessed-Lower Body Dressing: Wheelchair Toileting: Modified independent Where Assessed-Toileting: Teacher, Adult Education: Close supervision Toilet Transfer Method: Surveyor, Minerals: Engineer, Manufacturing Systems Transfer: Administrator, Arts Method: Administrator: Emergency planning/management officer,  Grab bars    Therapy/Group: Individual Therapy  Stefanie Hodgens 10/04/2024, 8:53 AM

## 2024-10-05 DIAGNOSIS — I63011 Cerebral infarction due to thrombosis of right vertebral artery: Secondary | ICD-10-CM | POA: Diagnosis not present

## 2024-10-05 DIAGNOSIS — G463 Brain stem stroke syndrome: Secondary | ICD-10-CM | POA: Diagnosis not present

## 2024-10-05 NOTE — Progress Notes (Signed)
 Physical Therapy Session Note  Patient Details  Name: Justin Hurst MRN: [id number] Date of Birth: 09/16/1956  Today's Date: 10/05/2024 PT Individual Time: 0807 - 0903 PT Individual Time Calculation (min): 56 min  PT Individual Time: 1035-1100 PT Individual Time Calculation (min): 25 min  PT Individual Time: 1449 - 1529 PT Individual Time Calculation (min): 40 min   Short Term Goals: Week 2:  PT Short Term Goal 1 (Week 2): = LTGs  SESSION 1 Skilled Therapeutic Interventions/Progress Updates: Patient supine in bed on entrance to room. Patient alert and agreeable to PT session.   Patient reported no pain. PTA and pt discussed pt's stair navigation and ambulation at home with wife following family education over the weekend. Pt will require wife to be present to provide CGA/minA with ambulation around household distances. PTA and pt discussed standard 18x18 WC when at home alone or when feeling more fatigued/unbalanced.  Therapeutic Activity: Bed Mobility: Pt performed supine<sit on EOB independently.  Transfers: Pt performed sit<>stand transfers throughout session with close supervision. Provided VC for pt to recall maintaining slightly wider than neutral BOS to stand and to increase step width during pivot, as well as hand placement.   Gait Training:  Pt ambulated 100' using RW with CGA. Pt demonstrated the following gait deviations with therapist providing the described cuing and facilitation for improvement:  - Pt cued to check BOS prior to initiating steps (that R LE is stepped out further to R vs towards midline) - Pt cued to maintain R step width placement to R with pt demonstrating improved centered weighted balance vs R lean as previously seen. - Pt with forward flexion posture to the R with cues throughout to maintain upright posture  - Pt ascended/descended 4 (6) steps with lateral step up to R and LHR ascending with B UE support. Pt required overall CGA and cue to decrease  forward flexion when descending and to decrease reliance of B UE to lean back and step down leading LE. Pt performed 2nd trail after seated rest required with CGA and improved adherence to cuing. Pt attempted to ascend another set of 4 following first without rest break but required to navigate back down after 3rd step due to B LE fatigue.   Patient sitting in WC at end of session with brakes locked, belt alarm set, and all needs within reach.  SESSION 2 Skilled Therapeutic Interventions/Progress Updates: Patient sitting in WC on entrance to room. Patient alert and agreeable to PT session.   Patient reported no pain. Pt missed recorded time due to [facility] delivery arriving and need to sign documents.   Therapeutic Activity: Transfers: Pt performed sit<>stand transfers throughout session with close supervision.  Gait Training:  Pt ambulated 100' x 1 and 150' x 1  and 142'  x 1 using RW with mostly CGA then light minA towards end of 2nd and 3rd trial due to increase in R lean and pt reporting decreased ability to self correct. Pt cued throughout to weight shift to L and to increase R hip extension for anterior flexion at hips to the R. Pt also cued to decrease cadence and to maintain R LE step placement.   Patient sitting in WC at end of session with brakes locked, and all needs within reach.  SESSION 3 Skilled Therapeutic Interventions/Progress Updates: Patient sitting in WC on entrance to room. Patient alert and agreeable to PT session.   Patient reported no pain. Pt propelled WC (personal WC delivered) from room<main gym  with reports of difficulty to adequately grip B wheel railing to propel. PTA donned B therabands to assist with pt propelling WC from main gym<ortho gym where pt demonstrated understanding of having to make wider turns when navigating through smaller door frames. Pt also ascending/descended ramp with cue to increase momentum to clear lip of ramp. Pt reported that therabands assist  significantly. Pt performed WC mobility with supervision/independently for safety with little to no cuing required. Pt performed stand step transfer with RW with supervision and pt recalling cue to maintain increased BOS and to increase step length during pivot. Pt on Nustep for 10 min level 4 resistance; 901 total steps; 81 spm; 2.3 METs,and 0.5 miles in order to increase B LE strength and endurance.   Patient supine in bed at end of session with brakes locked, and all needs within reach.        Therapy Documentation Precautions:  Precautions Precautions: Fall Recall of Precautions/Restrictions: Intact Restrictions Weight Bearing Restrictions Per Provider Order: No  Therapy/Group: Individual Therapy  Kseniya Grunden PTA 10/05/2024, 12:13 PM

## 2024-10-05 NOTE — Progress Notes (Signed)
 Pt refused the clear eyes eye drops. He states that this med was stopped.

## 2024-10-05 NOTE — Progress Notes (Signed)
 "                                                        PROGRESS NOTE   Subjective/Complaints:  Working on steps with PT, no pain c/os, RIght eye feels better   ROS: Patient denies CP, SOB, N/V/D  Objective:   No results found.  Recent Labs    10/04/24 0538  WBC 9.9  HGB 12.9*  HCT 39.2  PLT 245     Recent Labs    10/04/24 0538  NA 138  K 4.2  CL 103  CO2 27  GLUCOSE 91  BUN 22  CREATININE 0.93  CALCIUM  8.8*      Intake/Output Summary (Last 24 hours) at 10/05/2024 9092 Last data filed at 10/05/2024 0800 Gross per 24 hour  Intake 480 ml  Output 825 ml  Net -345 ml        Physical Exam: Vital Signs Blood pressure 135/68, pulse 63, temperature 97.9 F (36.6 C), temperature source Oral, resp. rate 16, height 5' 7 (1.702 m), weight 92.6 kg, SpO2 92%.   General: No acute distress Mood and affect are appropriate Heart: Regular rate and rhythm no rubs murmurs or extra sounds Lungs: Clear to auscultation, breathing unlabored, no rales or wheezes Abdomen: Positive bowel sounds, soft nontender to palpation, nondistended  Skin: No evidence of breakdown, no evidence of rash Neurologic: Cranial nerves II through XII intact, motor strength is 5/5 in bilateral deltoid, bicep, tricep, grip, hip flexor, knee extensors, ankle dorsiflexor and plantar flexor Sensory exam normal sensation to light touch  in bilateral upper and lower extremities Cerebellar exam mild dysmetria R FNF remains   Musculoskeletal: ~50% ankle PF/DF, inv/ev--no changes   Assessment/Plan: 1. Functional deficits which require 3+ hours per day of interdisciplinary therapy in a comprehensive inpatient rehab setting. Physiatrist is providing close team supervision and 24 hour management of active medical problems listed below. Physiatrist and rehab team continue to assess barriers to discharge/monitor patient progress toward functional and medical goals  Care Tool:  Bathing    Body parts  bathed by patient: Right arm, Left arm, Chest, Abdomen, Front perineal area, Right upper leg, Left upper leg, Face, Left lower leg, Buttocks, Right lower leg   Body parts bathed by helper: Right lower leg     Bathing assist Assist Level: Set up assist     Upper Body Dressing/Undressing Upper body dressing   What is the patient wearing?: Pull over shirt    Upper body assist Assist Level: Independent    Lower Body Dressing/Undressing Lower body dressing      What is the patient wearing?: Underwear/pull up, Pants     Lower body assist Assist for lower body dressing: Supervision/Verbal cueing     Toileting Toileting    Toileting assist Assist for toileting: Set up assist     Transfers Chair/bed transfer  Transfers assist     Chair/bed transfer assist level: Contact Guard/Touching assist     Locomotion Ambulation   Ambulation assist      Assist level: Minimal Assistance - Patient > 75% Assistive device: Walker-rolling Max distance: >250'   Walk 10 feet activity   Assist     Assist level: Minimal Assistance - Patient > 75% Assistive device: Walker-rolling   Walk 50 feet activity   Assist  Assist level: Minimal Assistance - Patient > 75% Assistive device: Walker-rolling    Walk 150 feet activity   Assist Walk 150 feet activity did not occur: Safety/medical concerns  Assist level: Minimal Assistance - Patient > 75% Assistive device: Walker-rolling    Walk 10 feet on uneven surface  activity   Assist Walk 10 feet on uneven surfaces activity did not occur: Safety/medical concerns   Assist level: Minimal Assistance - Patient > 75% Assistive device: Walker-rolling   Wheelchair     Assist Is the patient using a wheelchair?: Yes Type of Wheelchair: Manual    Wheelchair assist level: Minimal Assistance - Patient > 75% Max wheelchair distance: 125'    Wheelchair 50 feet with 2 turns activity    Assist        Assist  Level: Minimal Assistance - Patient > 75%   Wheelchair 150 feet activity     Assist      Assist Level: Minimal Assistance - Patient > 75%   Blood pressure 135/68, pulse 63, temperature 97.9 F (36.6 C), temperature source Oral, resp. rate 16, height 5' 7 (1.702 m), weight 92.6 kg, SpO2 92%.   Medical Problem List and Plan: 1. Functional deficits secondary to Right medullary infarct- righ themiataxia, facial parasthesia , lateralpulsion all consistent with PICA syndrome             -patient may  shower             -ELOS/Goals: 10/09/23 supervision to min assist goals  -Continue CIR therapies including PT, OT, and SLP  - team conf in am  2.  Antithrombotics: -DVT/anticoagulation:  Pharmaceutical: Lovenox              -antiplatelet therapy: ASA 3. Pain Management: Tylenol  prn 4. Mood/Behavior/Sleep: LCSW to follow for evaluation and support.              -antipsychotic agents: Seroquel  at bedtime (was increased at [facility]).             --doing well with Seroquel  and melatonin for sleep.  5. Neuropsych/cognition: This patient may be intermittently capable of making decisions on his own behalf. 6. Skin/Wound Care: Routine pressure relief measures. 7. Fluids/Electrolytes/Nutrition: Monitor I/O 8.  Right temporal lobe clival chordoma/radiation necrosis: Decadron  tapered to 2 mg daily since 12/21 Repeat CT head shows mild improvement with cerebral edema on lower dose of decadron   9. GERD: On Protonix  10. AKI; BUN/Scr now within normal range    Latest Ref Rng & Units 10/04/2024    5:38 AM 09/30/2024    5:43 AM 09/27/2024    5:39 AM  BMP  Glucose 70 - 99 mg/dL 91  90  896   BUN 8 - 23 mg/dL 22  25  23    Creatinine 0.61 - 1.24 mg/dL 9.06  9.03  8.99   Sodium 135 - 145 mmol/L 138  140  137   Potassium 3.5 - 5.1 mmol/L 4.2  4.1  5.1   Chloride 98 - 111 mmol/L 103  103  102   CO2 22 - 32 mmol/L 27  28  27    Calcium  8.9 - 10.3 mg/dL 8.8  8.9  9.2     11. Chronic right RTC injury: OT/PT to  address  12. Depression: On zoloft  100 mg daily 13.  Constipation: Has been refusing laxative. Dc'd miralax .  -had bm 1/4  14.  Bilateral ankle fusion limiting ability to compensate for stroke related balance deficit--   LOS: 15 days A  FACE TO FACE EVALUATION WAS PERFORMED  Prentice FORBES Compton 10/05/2024, 9:07 AM     "

## 2024-10-05 NOTE — Progress Notes (Signed)
 Occupational Therapy Session Note  Patient Details  Name: YEHUDA PRINTUP MRN: [id number] Date of Birth: [birth year]  Today's Date: 10/05/2024 OT Individual Time: 1107-1202 OT Individual Time Calculation (min): 55 min    Short Term Goals: Week 2:  OT Short Term Goal 1 (Week 2): Pt will complete w/c to toilet transfers with supervision. OT Short Term Goal 2 (Week 2): Pt will toilet with supervision. OT Short Term Goal 3 (Week 2): Pt will don clothing with distant S. OT Short Term Goal 4 (Week 2): Pt will stand and hold balance during self care at the sink without UE support with Supervision.  Skilled Therapeutic Interventions/Progress Updates:     Pt received sitting up in wc, dressed and ready for the day upon OT arrival. Pt presenting to be in good spirits receptive to skilled OT session reporting 0/10 pain- OT offering intermittent rest breaks, repositioning, and therapeutic support to optimize participation in therapy session.   Pt completed wc propulsion to therapy gym for endurance training with SUP, single rest break provided halfway during task.   Short distance functional mobility completed wc > EOM using RW ~46ft with MIN A required for balance 2/2 R lean- able to correct with MOD verbal and tactile cues for midline awareness and for R foot positioning.   Pt completed dynamic standing balance, functional reaching activity with blocked practice of sit<>stands incorporated into activity. Positioned large mirror anterior to Pt for visual feedback of body positioning and tasked Pt with completing sit<>stand from EOB and donning horse shoe over top of mirror. He completed 2x6 reps with CGA provided for balance, MOD verbal cues for anterior weight shifting d/t posterior lean.   Engaged pt in completing the following exercises to work on trunk control, dynamic balance, and U/LE strength:   -Chest press in standing, 2x10 reps, 3.3# weighted ball  -Sit<>stand with overhead press, 2x10  reps, 3.3# weighted ball  -Anterior shoulder flexion, 2x10 reps, 3.3# weighted ball  -Ball passes behind the back, 2x10 reps, 3.3# weighted ball MIN verbal cues required for sit<>stand technique and body position in standing to decrease posterior lean and close SUP for safety.   Pt completed w/c propulsion back to his room with SUP. Pt was left resting in wc with call bell in reach and all needs met.    Therapy Documentation Precautions:  Precautions Precautions: Fall Recall of Precautions/Restrictions: Intact Restrictions Weight Bearing Restrictions Per Provider Order: No   Therapy/Group: Individual Therapy  Katheryn SHAUNNA [place]ines 10/05/2024, 7:30 AM

## 2024-10-06 DIAGNOSIS — I63011 Cerebral infarction due to thrombosis of right vertebral artery: Secondary | ICD-10-CM | POA: Diagnosis not present

## 2024-10-06 DIAGNOSIS — G463 Brain stem stroke syndrome: Secondary | ICD-10-CM | POA: Diagnosis not present

## 2024-10-06 NOTE — Progress Notes (Signed)
 "                                                        PROGRESS NOTE   Subjective/Complaints:  Seen in PT gym, able to stand without support with wide based stance  ROS: Patient denies CP, SOB, N/V/D  Objective:   No results found.  Recent Labs    10/04/24 0538  WBC 9.9  HGB 12.9*  HCT 39.2  PLT 245     Recent Labs    10/04/24 0538  NA 138  K 4.2  CL 103  CO2 27  GLUCOSE 91  BUN 22  CREATININE 0.93  CALCIUM  8.8*      Intake/Output Summary (Last 24 hours) at 10/06/2024 1006 Last data filed at 10/06/2024 0913 Gross per 24 hour  Intake 780 ml  Output 2375 ml  Net -1595 ml        Physical Exam: Vital Signs Blood pressure 127/76, pulse 63, temperature 98.2 F (36.8 C), temperature source Oral, resp. rate 17, height 5' 7 (1.702 m), weight 92.6 kg, SpO2 96%.   General: No acute distress Mood and affect are appropriate Heart: Regular rate and rhythm no rubs murmurs or extra sounds Lungs: Clear to auscultation, breathing unlabored, no rales or wheezes Abdomen: Positive bowel sounds, soft nontender to palpation, nondistended  Skin: No evidence of breakdown, no evidence of rash Neurologic: Cranial nerves II through XII intact, motor strength is 5/5 in bilateral deltoid, bicep, tricep, grip, hip flexor, knee extensors, ankle dorsiflexor and plantar flexor Sensory exam normal sensation to light touch  in bilateral upper and lower extremities Cerebellar exam mild dysmetria R FNF remains   Musculoskeletal: ~50% ankle PF/DF, inv/ev--no changes   Assessment/Plan: 1. Functional deficits which require 3+ hours per day of interdisciplinary therapy in a comprehensive inpatient rehab setting. Physiatrist is providing close team supervision and 24 hour management of active medical problems listed below. Physiatrist and rehab team continue to assess barriers to discharge/monitor patient progress toward functional and medical goals  Care Tool:  Bathing    Body  parts bathed by patient: Right arm, Left arm, Chest, Abdomen, Front perineal area, Right upper leg, Left upper leg, Face, Left lower leg, Buttocks, Right lower leg   Body parts bathed by helper: Right lower leg     Bathing assist Assist Level: Set up assist     Upper Body Dressing/Undressing Upper body dressing   What is the patient wearing?: Pull over shirt    Upper body assist Assist Level: Independent    Lower Body Dressing/Undressing Lower body dressing      What is the patient wearing?: Underwear/pull up, Pants     Lower body assist Assist for lower body dressing: Supervision/Verbal cueing     Toileting Toileting    Toileting assist Assist for toileting: Set up assist     Transfers Chair/bed transfer  Transfers assist     Chair/bed transfer assist level: Contact Guard/Touching assist     Locomotion Ambulation   Ambulation assist      Assist level: Minimal Assistance - Patient > 75% Assistive device: Walker-rolling Max distance: >250'   Walk 10 feet activity   Assist     Assist level: Minimal Assistance - Patient > 75% Assistive device: Walker-rolling   Walk 50 feet activity   Assist  Assist level: Minimal Assistance - Patient > 75% Assistive device: Walker-rolling    Walk 150 feet activity   Assist Walk 150 feet activity did not occur: Safety/medical concerns  Assist level: Minimal Assistance - Patient > 75% Assistive device: Walker-rolling    Walk 10 feet on uneven surface  activity   Assist Walk 10 feet on uneven surfaces activity did not occur: Safety/medical concerns   Assist level: Minimal Assistance - Patient > 75% Assistive device: Walker-rolling   Wheelchair     Assist Is the patient using a wheelchair?: Yes Type of Wheelchair: Manual    Wheelchair assist level: Minimal Assistance - Patient > 75% Max wheelchair distance: 125'    Wheelchair 50 feet with 2 turns activity    Assist        Assist  Level: Minimal Assistance - Patient > 75%   Wheelchair 150 feet activity     Assist      Assist Level: Minimal Assistance - Patient > 75%   Blood pressure 127/76, pulse 63, temperature 98.2 F (36.8 C), temperature source Oral, resp. rate 17, height 5' 7 (1.702 m), weight 92.6 kg, SpO2 96%.   Medical Problem List and Plan: 1. Functional deficits secondary to Right medullary infarct- righ themiataxia, facial parasthesia , lateralpulsion all consistent with PICA syndrome             -patient may  shower             -ELOS/Goals: 10/09/23 supervision to min assist goals Team conference today please see physician documentation under team conference tab, met with team  to discuss problems,progress, and goals. Formulized individual treatment plan based on medical history, underlying problem and comorbidities.   -Continue CIR therapies including PT, OT, and SLP  - team conf in am  2.  Antithrombotics: -DVT/anticoagulation:  Pharmaceutical: Lovenox              -antiplatelet therapy: ASA 3. Pain Management: Tylenol  prn 4. Mood/Behavior/Sleep: LCSW to follow for evaluation and support.              -antipsychotic agents: Seroquel  at bedtime (was increased at [facility]).             --doing well with Seroquel  and melatonin for sleep.  5. Neuropsych/cognition: This patient may be intermittently capable of making decisions on his own behalf. 6. Skin/Wound Care: Routine pressure relief measures. 7. Fluids/Electrolytes/Nutrition: Monitor I/O 8.  Right temporal lobe clival chordoma/radiation necrosis: Decadron  tapered to 2 mg daily since 12/21 Repeat CT head shows mild improvement with cerebral edema on lower dose of decadron   9. GERD: On Protonix  10. AKI; BUN/Scr now within normal range    Latest Ref Rng & Units 10/04/2024    5:38 AM 09/30/2024    5:43 AM 09/27/2024    5:39 AM  BMP  Glucose 70 - 99 mg/dL 91  90  896   BUN 8 - 23 mg/dL 22  25  23    Creatinine 0.61 - 1.24 mg/dL 9.06  9.03  8.99    Sodium 135 - 145 mmol/L 138  140  137   Potassium 3.5 - 5.1 mmol/L 4.2  4.1  5.1   Chloride 98 - 111 mmol/L 103  103  102   CO2 22 - 32 mmol/L 27  28  27    Calcium  8.9 - 10.3 mg/dL 8.8  8.9  9.2     11. Chronic right RTC injury: OT/PT to address  12. Depression: On zoloft  100 mg daily 13.  Constipation: Has been refusing laxative. Dc'd miralax .  -had bm 1/4  14.  Bilateral ankle fusion limiting ability to compensate for stroke related balance deficit--   LOS: 16 days A FACE TO FACE EVALUATION WAS PERFORMED  Prentice FORBES Compton 10/06/2024, 10:06 AM     "

## 2024-10-06 NOTE — Progress Notes (Signed)
 Physical Therapy Session Note  Patient Details  Name: Justin Hurst MRN: [id number] Date of Birth: 03/17/1956  Today's Date: 10/06/2024 PT Individual Time: 8961-8882; 1436 - 1532 PT Individual Time Calculation (min): 39 min; 56 min   Short Term Goals: Week 2:  PT Short Term Goal 1 (Week 2): = LTGs  SESSION 1 Skilled Therapeutic Interventions/Progress Updates: Patient sitting in WC on entrance to room. Patient alert and agreeable to PT session.   Patient reported no pain  Therapeutic Activity: Transfers: Pt performed sit<>stand transfers throughout session with supervision in preparation for functional mobility with pt continuing to demonstrate improved COG vs R lean.   Pt navigated 11 (7) steps with lateral step method and LHR ascending. Pt cued to increase BOS to staggered step and B UE on railing. Pt also cued to maintain upright standing posture vs leaning posteriorly when ascending. Pt overall CGA both ascending/descending and required minA at top of steps to safely sit to chair at top for safety due to reports of B LE fatigue  Five times Sit to Stand Test (FTSS) Method: Use a straight back chair with a solid seat that is 16-18 high. Ask participant to sit on the chair with arms folded across their chest.   Instructions: Stand up and sit down as quickly as possible 5 times, keeping your arms folded across your chest.   Measurement: Stop timing when the participant stands the 5th time.   TIME: _15.91_ (in seconds) - 19.12s - 15.09s - 13.54s   Times > 13.6 seconds is associated with increased disability and morbidity (Guralnik, 2000) Times > 15 seconds is predictive of recurrent falls in healthy individuals aged 64 and older (Buatois, et al., 2008) Normal performance values in community dwelling individuals aged 24 and older (Bohannon, 2006): 60-69 years: 11.4 seconds 70-79 years: 12.6 seconds 80-89 years: 14.8 seconds   MCID: >= 2.3 seconds for Vestibular Disorders  (Meretta, 2006)  TUG (avg = 16.16s) in RW with CGA  - 16.76s  - 14.73s  - 17.00s  Pt propelled WC from room<>main gym with supervision and education/cue on how to donn/doff B leg rests. Pt performed 180* turn in room with objects to avoid and did so safely  Patient sitting in WC at end of session with brakes locked, and all needs within reach.  SESSION 2 Skilled Therapeutic Interventions/Progress Updates: Patient sitting in [facility] with wife present on entrance to room. Patient alert and agreeable to PT session.   Patient reported no pain. Pt wife with questions regarding pt use of seated bike (not like a high seated peloton but one lower to ground) with PTA stating if pt can sit safely on seat that it would be encouraged to increase pt's B LE strength and endurance. Pt wife with greatest concern being stairs due to having to navigate 10 at home. PTA encouraged pt and pt wife to install hand rail at bottom of steps in basement if pt wants to go down to bottom floor, and to avoid going down until it is installed for safety (initially, pt navigated 2 steps with R HHA to assess if this would be safe with pt's wife to perform to get to bottom floor, but pt's LE were fatigued and pt required minA and felt unsafe due to said fatigue). PTA also encourage pt and pt wife to install ramp on high step in front of house that leads to 5 step entrance as 5 steps are safer to manage than 10 steps (PTA provided paper  instructions on how to build a ramp).   Pt ambulated 195' with RW and pt wife providing CGA and guard on R with PTA educating on cueing pt to ensure increased BOS, upright posture and to stay within RW frame. PTA followed in [facility] and pt required seated rest. Pt attempted to navigate 11 steps in stair way to simulate height at home but pt stepped up 4 and required return to [facility] due to LE fatigue. PTA demonstrated pt to laterally step with wife behind pt ascending and in front of pt descending Pt navigated 4  (6) steps in main gym with pt wife assisting with CGA and same body placement/hand placement as before. Pt required CGA and safely navigated steps with education/cuing for pt to maintain upright posture vs leaning to R/L. Pt and pt wife felt confident in pt's safe d/c home.  Patient sitting in WC at end of session with brakes locked, wife present, and all needs within reach.       Therapy Documentation Precautions:  Precautions Precautions: Fall Recall of Precautions/Restrictions: Intact Restrictions Weight Bearing Restrictions Per Provider Order: No   Therapy/Group: Individual Therapy  Keali Mccraw PTA 10/06/2024, 12:26 PM

## 2024-10-06 NOTE — Progress Notes (Signed)
 Occupational Therapy Session Note  Patient Details  Name: DELSHON BLANCHFIELD MRN: [id number] Date of Birth: 25-Apr-1956  Today's Date: 10/06/2024 OT Individual Time: 0920-1017+1336-1429 OT Individual Time Calculation (min): 57 min    Short Term Goals: Week 2:  OT Short Term Goal 1 (Week 2): Pt will complete w/c to toilet transfers with supervision. OT Short Term Goal 2 (Week 2): Pt will toilet with supervision. OT Short Term Goal 3 (Week 2): Pt will don clothing with distant S. OT Short Term Goal 4 (Week 2): Pt will stand and hold balance during self care at the sink without UE support with Supervision.  Skilled Therapeutic Interventions/Progress Updates:  Session 1: Pt greeted supine in bed, pt agreeable to OT intervention.   No pain reported during session.    Transfers/bed mobility/functional mobility:  Pt completed supine>sit with supervision. Pt completed stand pivot transfers with Rw with CGA.   Practiced simulated shower transfers to practice later with wife in family ed. Practiced stepping backwards over shower threshold with RW for support, pt completed multiple reps with MINA and mod cues for sequencing and set-up, however after further discussion feel like using a TTB or alternative method maybe easier for caregiver burden.  Will discuss in next OT session with wife.   Therapeutic activity:  Pt completed Short distance functional ambulation task with pt instructed to ambulate ~ 10 ft apart to transport items with a focus on dynamic reaching and simulating short distance functional ambulation, I.e tight turns with RW. Pt completed task with CGA-MINA, mild LOB to R side needing MINA to recover.   Ended session with pt seated in w/c with all needs within reach and chair alarm activated.                      Session 2: Pt greeted seated in w/c, pt agreeable to OT intervention.   No pain reported during session.    family education provided to wife, Arland on the below  topics: -always using gait belt/RW for functional mobility - general assist needed for ADLS and functional mobility ( I.e MODI- CGA) -recommendation to try to not sit for longer than 1 hour at a time -general education provided on impaired balance and intermittent LOB to R side -education provided on walking with pt on R side and gait belt handling technique -education provided on appropriate cues needed I.e verbal cues about wide BOS, and breaking down movements into smaller steps -decreasing clutter/fall hazards in the home -education provided on all w/c parts with wife and pt able to return demo  -education provided on shower transfers to walkin shower, wife able to demo hands on assist with side stepping into shower with L side going in first with bilateral support from hand rail and then exiting shower with R side first.  -wife also completed hands on training with squat pivot transfers from w/c<>recliner with good carryover of education.                Ended session with pt seated in w/c with all needs within reach.   Therapy Documentation Precautions:  Precautions Precautions: Fall Recall of Precautions/Restrictions: Intact Restrictions Weight Bearing Restrictions Per Provider Order: No    Therapy/Group: Individual Therapy  Ronal Gift [facility] 10/06/2024, 12:03 PM

## 2024-10-06 NOTE — Progress Notes (Signed)
 Patient ID: Justin Hurst, male   DOB: Jan 19, 1956, 69 y.o.   MRN: [id number]  Met with pt and wife who is present for family education in preparation for discharge on Friday. Aware of team conference and has exceeded his goals. Wheelchair in room and fits well and OP rehab at [place] will reach out to wife to set up follow up appointments. Both feel ready to go home on Friday

## 2024-10-06 NOTE — Patient Care Conference (Signed)
 Inpatient RehabilitationTeam Conference and Plan of Care Update Date: 10/06/2024   Time: 10:25 AM    Patient Name: Justin Hurst      Medical Record Number: [id number]  Date of Birth: 1956/08/18 Sex: Male         Room/Bed: 4W24C/4W24C-01 Payor Info: Payor: [employer] MEDICARE / Plan: [facility] MEDICARE / Product Type: *No Product type* /    Admit Date/Time:  09/20/2024  1:01 PM  Primary Diagnosis:  Stroke (cerebrum) [facility])  Hospital Problems: Principal Problem:   Stroke (cerebrum) [facility])    Expected Discharge Date: Expected Discharge Date: 10/08/24  Team Members Present: Physician leading conference: Dr. Prentice Compton Social Worker Present: Rhoda Clement, LCSW Nurse Present: Barnie Ronde, RN PT Present: Delinda Bertrand, PTA;Sherlean Perks, PT OT Present: Julia Saguier, OT SLP Present: Cassidi Sockwell, SLP     Current Status/Progress Goal Weekly Team Focus  Bowel/Bladder   Continent of B/B internmittent incontinence LBM-1/13   Maintain continence   Assist with toileting needs prn    Swallow/Nutrition/ Hydration               ADL's   close S to occasional light CGA with dressing and toileting,  sup bathing and wc to toilet transfers, CGA to min A with ambulation   upgraded goals to mod I dressing, and toileting and toilet transfer from wc level   BADLs, standing balance, transfers, activity tolerance    Mobility   supervision with bed mobility and transfers (if following cues); gait = can be CGA if following cue to maintain increase R step width but decreases to minA with fatigue   mostly supervision/modI but will need to downgrad to minA for ambulation and stairs?  Focus = family ed, dynamic standing balance, stairway navigation gait    Communication                Safety/Cognition/ Behavioral Observations               Pain   Denies pain   Remain free of pain   Assess and address pain every shift and as needed    Skin   Skin intact    M;aintain skin integrity  Assess skin every shift and prn      Discharge Planning:  Wife came in Sat to observe in therapies and is coming back today to complete it. Wife works and will be with a few days for transition and pt has reached mod/i wheelchair level if home alone. 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 Patient has trouble with static balance and shifting side to side with a right lean.   Patient on target to meet rehab goals: yes, currently has met goals with noted improvement in right lean and is medically stable for discharge.  Completes transfers wit supervision using a RW and ambulates using a RW with CGA.  *See Care Plan and progress notes for long and short-term goals.   Revisions to Treatment Plan:  Upgraded OT goals to Mod I assist Downgraded PT goals for steps to min assist   Teaching Needs: Safety, medications, transfers, toileting, etc.   Current Barriers to Discharge: Decreased caregiver support  Possible Resolutions to Barriers: Family education 10/06/24 DME: wheelchair OP follow  up services     Medical Summary Current Status: Subconjunctival hemorrhage improving Right side, BP controlled  Barriers to Discharge: Other (comments)  Barriers to Discharge Comments: poor trunkal control, pre existing radiation necrosis Possible Resolutions to Barriers/Weekly Focus: PT, OT to help with fall risk   Continued Need for Acute Rehabilitation Level of Care: The patient requires daily medical management by a physician with specialized training in physical medicine and rehabilitation for the following reasons: Direction of a multidisciplinary physical rehabilitation program to maximize  functional independence : Yes Medical management of patient stability for increased activity during participation in an intensive rehabilitation regime.: Yes Analysis of laboratory values and/or radiology reports with any subsequent need for medication adjustment and/or medical intervention. : Yes   I attest that I was present, lead the team conference, and concur with the assessment and plan of the team.   Fredericka Sober B 10/06/2024, 2:20 PM

## 2024-10-07 ENCOUNTER — Other Ambulatory Visit (HOSPITAL_COMMUNITY): Payer: Self-pay

## 2024-10-07 DIAGNOSIS — Z981 Arthrodesis status: Secondary | ICD-10-CM | POA: Diagnosis not present

## 2024-10-07 DIAGNOSIS — I6789 Other cerebrovascular disease: Secondary | ICD-10-CM | POA: Diagnosis not present

## 2024-10-07 DIAGNOSIS — G464 Cerebellar stroke syndrome: Secondary | ICD-10-CM | POA: Diagnosis not present

## 2024-10-07 DIAGNOSIS — Y842 Radiological procedure and radiotherapy as the cause of abnormal reaction of the patient, or of later complication, without mention of misadventure at the time of the procedure: Secondary | ICD-10-CM | POA: Diagnosis not present

## 2024-10-07 DIAGNOSIS — I63011 Cerebral infarction due to thrombosis of right vertebral artery: Secondary | ICD-10-CM | POA: Diagnosis not present

## 2024-10-07 LAB — BASIC METABOLIC PANEL WITH GFR
Anion gap: 9 (ref 5–15)
BUN: 20 mg/dL (ref 8–23)
CO2: 27 mmol/L (ref 22–32)
Calcium: 9.2 mg/dL (ref 8.9–10.3)
Chloride: 103 mmol/L (ref 98–111)
Creatinine, Ser: 0.88 mg/dL (ref 0.61–1.24)
GFR, Estimated: >60 mL/min
Glucose, Bld: 89 mg/dL (ref 70–99)
Potassium: 4.6 mmol/L (ref 3.5–5.1)
Sodium: 138 mmol/L (ref 135–145)

## 2024-10-07 LAB — CBC
HCT: 38.5 % — ABNORMAL LOW (ref 39.0–52.0)
Hemoglobin: 12.9 g/dL — ABNORMAL LOW (ref 13.0–17.0)
MCH: 31.2 pg (ref 26.0–34.0)
MCHC: 33.5 g/dL (ref 30.0–36.0)
MCV: 93 fL (ref 80.0–100.0)
Platelets: 240 K/uL (ref 150–400)
RBC: 4.14 MIL/uL — ABNORMAL LOW (ref 4.22–5.81)
RDW: 13.9 % (ref 11.5–15.5)
WBC: 8.4 K/uL (ref 4.0–10.5)
nRBC: 0 % (ref 0.0–0.2)

## 2024-10-07 MED ORDER — DICLOFENAC SODIUM 1 % EX GEL: 2.0000 g | Freq: Four times a day (QID) | CUTANEOUS | Status: AC

## 2024-10-07 MED ORDER — CARBOXYMETHYLCELLULOSE SODIUM 0.5 % OP SOLN: 1.0000 [drp] | Freq: Four times a day (QID) | OPHTHALMIC | Status: AC

## 2024-10-07 MED ORDER — ASPIRIN 81 MG PO TBEC
81.0000 mg | DELAYED_RELEASE_TABLET | Freq: Every day | ORAL | 0 refills | Status: AC
  Filled 2024-10-07: qty 100, 100d supply, fill #0

## 2024-10-07 MED ORDER — ATORVASTATIN CALCIUM 80 MG PO TABS
80.0000 mg | ORAL_TABLET | Freq: Every day | ORAL | 0 refills | Status: AC
  Filled 2024-10-07: qty 30, 30d supply, fill #0

## 2024-10-07 MED ORDER — SERTRALINE HCL 100 MG PO TABS: 100.0000 mg | ORAL_TABLET | Freq: Every day | ORAL | Status: AC

## 2024-10-07 MED ORDER — MELATONIN 3 MG PO TABS
3.0000 mg | ORAL_TABLET | Freq: Every day | ORAL | 0 refills | Status: AC
  Filled 2024-10-07: qty 30, 30d supply, fill #0

## 2024-10-07 MED ORDER — ACETAMINOPHEN 325 MG PO TABS: 325.0000 mg | ORAL_TABLET | ORAL | Status: AC | PRN

## 2024-10-07 MED ORDER — SALINE SPRAY 0.65 % NA SOLN: 1.0000 | NASAL | Status: AC | PRN

## 2024-10-07 MED ORDER — SENNA-DOCUSATE SODIUM 8.6-50 MG PO TABS: 2.0000 | ORAL_TABLET | Freq: Every day | ORAL | Status: AC | PRN

## 2024-10-07 MED ORDER — PANTOPRAZOLE SODIUM 40 MG PO TBEC
40.0000 mg | DELAYED_RELEASE_TABLET | Freq: Every day | ORAL | 0 refills | Status: AC
  Filled 2024-10-07: qty 30, 30d supply, fill #0

## 2024-10-07 MED ORDER — DEXAMETHASONE 2 MG PO TABS
2.0000 mg | ORAL_TABLET | Freq: Every day | ORAL | 0 refills | Status: AC
  Filled 2024-10-07: qty 30, 30d supply, fill #0

## 2024-10-07 MED ORDER — QUETIAPINE FUMARATE 25 MG PO TABS
37.5000 mg | ORAL_TABLET | Freq: Every day | ORAL | 0 refills | Status: AC
  Filled 2024-10-07: qty 45, 30d supply, fill #0

## 2024-10-07 NOTE — Progress Notes (Signed)
 Physical Therapy Discharge Summary  Patient Details  Name: Justin Hurst MRN: [id number] Date of Birth: 07-18-1956  Date of Discharge from PT service:October 07, 2024  Patient has met 6 of 9 long term goals due to improved activity tolerance, improved balance, improved postural control, increased strength, decreased pain, ability to compensate for deficits, functional use of  right lower extremity, improved awareness, and improved coordination.  Patient to discharge at an ambulatory level CGA with RW due to balance/postural control impairments which fluctuates with fatigue. Pt is supervision with transfers and w/c mobility.   Patient's care partner is independent to provide the necessary physical and supervision/cueing assistance at discharge.  Reasons goals not met: Pt still required CGA for safety to ambulate/navigate stairs prior to d/c due to decreased endurance and R lean with fatigue  Recommendation:  Patient will benefit from ongoing skilled PT services in outpatient setting to continue to advance safe functional mobility, address ongoing impairments in strength, postural control, balance, endurance, functional mobility, and minimize fall risk.  Equipment: W/c  Reasons for discharge: treatment goals met and discharge from hospital  Patient/family agrees with progress made and goals achieved: Yes  PT Discharge Precautions/Restrictions Precautions Precautions: Fall Restrictions Weight Bearing Restrictions Per Provider Order: No Pain Pain Assessment Pain Scale: 0-10 Pain Score: 0-No pain Pain Interference Pain Interference Pain Effect on Sleep: 1. Rarely or not at all Pain Interference with Therapy Activities: 1. Rarely or not at all Pain Interference with Day-to-Day Activities: 1. Rarely or not at all Vision/Perception  Vision - History Ability to See in Adequate Light: 0 Adequate Vision - Assessment Additional Comments: blurry vision fluctuates, pt will f/u with  opthamologist Perception Perception-Other Comments: spatial orientation improved from admission, pt occasionally has a R lean with ambulation Praxis Praxis: WFL  Cognition Overall Cognitive Status: Within Functional Limits for tasks assessed Arousal/Alertness: Awake/alert Orientation Level: Oriented X4 Attention: Selective Focused Attention: Appears intact Sustained Attention: Appears intact Selective Attention: Appears intact Awareness: Appears intact Safety/Judgment: Appears intact Sensation Sensation Light Touch: Appears Intact Hot/Cold: Appears Intact Proprioception: Impaired by gross assessment Stereognosis: Appears Intact Additional Comments: reports numbness in R side of face and RUE; proprioception improved since admission but he continues to have min deficits Coordination Gross Motor Movements are Fluid and Coordinated: No Fine Motor Movements are Fluid and Coordinated: Yes Coordination and Movement Description: decreased coordination especially in LE with dynamic standing without RW Motor  Motor Motor: Other (comment) Motor - Discharge Observations: apraxia and ataxia improved from admission but continues to have deficits with dynamic standing and ambulation  Mobility Bed Mobility Bed Mobility: Supine to Sit;Sit to Supine Supine to Sit: Independent Sit to Supine: Independent Transfers Transfers: Sit to Stand;Stand to Sit;Stand Pivot Transfers;Transfer Sit to Stand: Supervision/Verbal cueing Stand to Sit: Supervision/Verbal cueing Stand Pivot Transfers: Supervision/Verbal cueing Transfer (Assistive device): Rolling walker Locomotion  Gait Ambulation: Yes Gait Assistance: Contact Guard/Touching assist Gait Distance (Feet): 150 Feet Assistive device: Rolling walker Gait Assistance Details: Verbal cues for safe use of DME/AE;Verbal cues for gait pattern Gait Gait: Yes Gait Pattern: Impaired Gait Pattern: Right flexed knee in stance;Lateral trunk lean to  right;Narrow base of support (lateral trunk lean to R has improved since evaluation) Gait velocity: decreased Stairs / Additional Locomotion Stairs: Yes Stair Management Technique: One rail Right;One rail Left (B UE support on one railing with lateral step to) Number of Stairs: 10 Height of Stairs: 6 Wheelchair Mobility Wheelchair Mobility: Yes Wheelchair Assistance: Doctor, General Practice: Both upper extremities Wheelchair  Parts Management: Supervision/cueing Distance: 150  Trunk/Postural Assessment  Cervical Assessment Cervical Assessment: Within Functional Limits Thoracic Assessment Thoracic Assessment: Within Functional Limits Lumbar Assessment Lumbar Assessment: Within Functional Limits Postural Control Postural Control: Deficits on evaluation Righting Reactions: impaired, tends to lean to the R but has improved since evaluation Protective Responses: delayed  Balance Balance Balance Assessed: Yes Standardized Balance Assessment Standardized Balance Assessment: Timed Up and Go Test Timed Up and Go Test TUG: Normal TUG Normal TUG (seconds): 16.16 Static Sitting Balance Static Sitting - Balance Support: Feet unsupported Static Sitting - Level of Assistance: 7: Independent Dynamic Sitting Balance Dynamic Sitting - Balance Support: Feet unsupported Dynamic Sitting - Level of Assistance: 6: Modified independent (Device/Increase time) Static Standing Balance Static Standing - Balance Support: Bilateral upper extremity supported Static Standing - Level of Assistance: 6: Modified independent (Device/Increase time) Dynamic Standing Balance Dynamic Standing - Balance Support: During functional activity Dynamic Standing - Level of Assistance: 5: Stand by assistance Extremity Assessment  RLE Assessment RLE Assessment: Within Functional Limits LLE Assessment LLE Assessment: Within Functional Limits   Dominic Sandoval PTA   10/07/2024, 12:58  PM  Donald [name] Pereyra, PT, DPT, CBIS 10/08/24

## 2024-10-07 NOTE — Progress Notes (Signed)
 "                                                        PROGRESS NOTE   Subjective/Complaints:  No issues overnite   ROS: Patient denies CP, SOB, N/V/D  Objective:   No results found.  Recent Labs    10/07/24 0422  WBC 8.4  HGB 12.9*  HCT 38.5*  PLT 240     Recent Labs    10/07/24 0422  NA 138  K 4.6  CL 103  CO2 27  GLUCOSE 89  BUN 20  CREATININE 0.88  CALCIUM  9.2      Intake/Output Summary (Last 24 hours) at 10/07/2024 0826 Last data filed at 10/07/2024 0700 Gross per 24 hour  Intake 836 ml  Output 2350 ml  Net -1514 ml        Physical Exam: Vital Signs Blood pressure 124/71, pulse 60, temperature 97.8 F (36.6 C), resp. rate 16, height 5' 7 (1.702 m), weight 92.6 kg, SpO2 95%.   General: No acute distress Mood and affect are appropriate Heart: Regular rate and rhythm no rubs murmurs or extra sounds Lungs: Clear to auscultation, breathing unlabored, no rales or wheezes Abdomen: Positive bowel sounds, soft nontender to palpation, nondistended  Skin: No evidence of breakdown, no evidence of rash Neurologic: Cranial nerves II through XII intact, motor strength is 5/5 in bilateral deltoid, bicep, tricep, grip, hip flexor, knee extensors, ankle dorsiflexor and plantar flexor Sensory exam normal sensation to light touch  in bilateral upper and lower extremities Cerebellar exam mild dysmetria R FNF remains   Musculoskeletal: ~50% ankle PF/DF, inv/ev--no changes   Assessment/Plan: 1. Functional deficits which require 3+ hours per day of interdisciplinary therapy in a comprehensive inpatient rehab setting. Physiatrist is providing close team supervision and 24 hour management of active medical problems listed below. Physiatrist and rehab team continue to assess barriers to discharge/monitor patient progress toward functional and medical goals  Care Tool:  Bathing    Body parts bathed by patient: Right arm, Left arm, Chest, Abdomen, Front perineal  area, Right upper leg, Left upper leg, Face, Left lower leg, Buttocks, Right lower leg   Body parts bathed by helper: Right lower leg     Bathing assist Assist Level: Set up assist     Upper Body Dressing/Undressing Upper body dressing   What is the patient wearing?: Pull over shirt    Upper body assist Assist Level: Independent    Lower Body Dressing/Undressing Lower body dressing      What is the patient wearing?: Underwear/pull up, Pants     Lower body assist Assist for lower body dressing: Supervision/Verbal cueing     Toileting Toileting    Toileting assist Assist for toileting: Set up assist     Transfers Chair/bed transfer  Transfers assist     Chair/bed transfer assist level: Contact Guard/Touching assist     Locomotion Ambulation   Ambulation assist      Assist level: Minimal Assistance - Patient > 75% Assistive device: Walker-rolling Max distance: >250'   Walk 10 feet activity   Assist     Assist level: Minimal Assistance - Patient > 75% Assistive device: Walker-rolling   Walk 50 feet activity   Assist    Assist level: Minimal Assistance - Patient > 75% Assistive device:  Walker-rolling    Walk 150 feet activity   Assist Walk 150 feet activity did not occur: Safety/medical concerns  Assist level: Minimal Assistance - Patient > 75% Assistive device: Walker-rolling    Walk 10 feet on uneven surface  activity   Assist Walk 10 feet on uneven surfaces activity did not occur: Safety/medical concerns   Assist level: Minimal Assistance - Patient > 75% Assistive device: Walker-rolling   Wheelchair     Assist Is the patient using a wheelchair?: Yes Type of Wheelchair: Manual    Wheelchair assist level: Minimal Assistance - Patient > 75% Max wheelchair distance: 125'    Wheelchair 50 feet with 2 turns activity    Assist        Assist Level: Minimal Assistance - Patient > 75%   Wheelchair 150 feet activity      Assist      Assist Level: Minimal Assistance - Patient > 75%   Blood pressure 124/71, pulse 60, temperature 97.8 F (36.6 C), resp. rate 16, height 5' 7 (1.702 m), weight 92.6 kg, SpO2 95%.   Medical Problem List and Plan: 1. Functional deficits secondary to Right medullary infarct- righ themiataxia, facial parasthesia , lateralpulsion all consistent with PICA syndrome             -patient may  shower             -ELOS/Goals: 10/09/23 supervision to min assist goals    -Continue CIR therapies including PT, OT, and SLP  - team conf in am  2.  Antithrombotics: -DVT/anticoagulation:  Pharmaceutical: Lovenox              -antiplatelet therapy: ASA 3. Pain Management: Tylenol  prn 4. Mood/Behavior/Sleep: LCSW to follow for evaluation and support.              -antipsychotic agents: Seroquel  at bedtime (was increased at [facility]).             --doing well with Seroquel  and melatonin for sleep.  5. Neuropsych/cognition: This patient may be intermittently capable of making decisions on his own behalf. 6. Skin/Wound Care: Routine pressure relief measures. 7. Fluids/Electrolytes/Nutrition: Monitor I/O 8.  Right temporal lobe clival chordoma/radiation necrosis: Decadron  tapered to 2 mg daily since 12/21 Repeat CT head shows mild improvement with cerebral edema on lower dose of decadron   9. GERD: On Protonix  10. AKI; BUN/Scr now within normal range    Latest Ref Rng & Units 10/07/2024    4:22 AM 10/04/2024    5:38 AM 09/30/2024    5:43 AM  BMP  Glucose 70 - 99 mg/dL 89  91  90   BUN 8 - 23 mg/dL 20  22  25    Creatinine 0.61 - 1.24 mg/dL 9.11  9.06  9.03   Sodium 135 - 145 mmol/L 138  138  140   Potassium 3.5 - 5.1 mmol/L 4.6  4.2  4.1   Chloride 98 - 111 mmol/L 103  103  103   CO2 22 - 32 mmol/L 27  27  28    Calcium  8.9 - 10.3 mg/dL 9.2  8.8  8.9     11. Chronic right RTC injury: OT/PT to address  12. Depression: On zoloft  100 mg daily 13.  Constipation: Has been refusing laxative.  Dc'd miralax .  -had bm 1/4  14.  Bilateral ankle fusion limiting ability to compensate for stroke related balance deficit--   LOS: 17 days A FACE TO FACE EVALUATION WAS PERFORMED  Justin Hurst 10/07/2024,  8:26 AM     "

## 2024-10-07 NOTE — Plan of Care (Signed)
  Problem: RH Balance Goal: LTG: Patient will maintain dynamic sitting balance (OT) Description: LTG:  Patient will maintain dynamic sitting balance with assistance during activities of daily living (OT) Outcome: Completed/Met Goal: LTG Patient will maintain dynamic standing with ADLs (OT) Description: LTG:  Patient will maintain dynamic standing balance with assist during activities of daily living (OT)  Outcome: Completed/Met   Problem: Sit to Stand Goal: LTG:  Patient will perform sit to stand in prep for activites of daily living with assistance level (OT) Description: LTG:  Patient will perform sit to stand in prep for activites of daily living with assistance level (OT) Outcome: Completed/Met   Problem: RH Bathing Goal: LTG Patient will bathe all body parts with assist levels (OT) Description: LTG: Patient will bathe all body parts with assist levels (OT) Outcome: Completed/Met   Problem: RH Dressing Goal: LTG Patient will perform upper body dressing (OT) Description: LTG Patient will perform upper body dressing with assist, with/without cues (OT). Outcome: Completed/Met Goal: LTG Patient will perform lower body dressing w/assist (OT) Description: LTG: Patient will perform lower body dressing with assist, with/without cues in positioning using equipment (OT) Outcome: Completed/Met   Problem: RH Toileting Goal: LTG Patient will perform toileting task (3/3 steps) with assistance level (OT) Description: LTG: Patient will perform toileting task (3/3 steps) with assistance level (OT)  Outcome: Completed/Met   Problem: RH Toilet Transfers Goal: LTG Patient will perform toilet transfers w/assist (OT) Description: LTG: Patient will perform toilet transfers with assist, with/without cues using equipment (OT) Outcome: Completed/Met

## 2024-10-07 NOTE — Progress Notes (Signed)
 Physical Therapy Session Note  Patient Details  Name: Justin Hurst MRN: [id number] Date of Birth: 15-Nov-1955  Today's Date: 10/07/2024 PT Individual Time: 9082-8969; 1421 - 1530 PT Individual Time Calculation (min): 73 min; 69 min   Short Term Goals: Week 2:  PT Short Term Goal 1 (Week 2): = LTGs  SESSION 1 Skilled Therapeutic Interventions/Progress Updates: Patient supine in bed on entrance to room. Patient alert and agreeable to PT session.   Patient reported no pain. Pt required cues to recall 2/3 steps in preparation for functional mobility which is to maintain increased BOS and and upright posture standing. Pt feels prepared in safe d/c home and is appreciative of functional progress with mobility.   Therapeutic Activity: Bed Mobility: Pt performed supine<sit on EOB independently. Transfers: Pt performed sit<>stand transfers throughout session with supervision and pt recalling cue to maintain at least neutral BOS or more to decrease chance of R lean.  Neuromuscular Re-ed: NMR facilitated during session with focus on dynamic standing balance, coordination, proprioceptive feedback. - Pt in // bars and ambulating anteriorly with B HHA and overall minA and cues to correct COG to center vs slight lean to R. Pt has significantly improved standing balance since last time this was performed with this PTA as pt required closer to modA - Pt tossing bean bags to cornhole board while standing on airex pad with R HHA and overall minA. Pt with decreased ankle strategies due to B ankle fusions. 3 rounds performed with pt improving coordination and progressed closer to CGA for standing balance.  - Sit<>stand with B UE holding tidal tank up to chest with cue to avoid compensating with using back of knees on edge of mat. Pt cued to also increase B knee and hip extension and to shift weight to L to center. Pt with mirror feedbvack  NMR performed for improvements in motor control and coordination,  balance, sequencing, judgement, and self confidence/ efficacy in performing all aspects of mobility at highest level of independence.   Patient sitting in WC at end of session with brakes locked, and all needs within reach.  SESSION 2 Skilled Therapeutic Interventions/Progress Updates: Patient sitting in WC on entrance to room. Patient alert and agreeable to PT session.   Patient reported no pain. See d/c for summary. HEP provided.   Pt propelled WC from room past main gym with B UE, then propelled while towing PTA sitting on rolling chair for added resistance. Pt required rest break and then continued until pt required dependent transport to [facility] room to change leg rests for increased independence with doffing/donning.   Access Code: [email or web address] URL: [email or web address] Date: 10/07/2024 Prepared by: Delinda Bertrand  Exercises - Supine Bridge  - 1 x daily - 7 x weekly - 3 sets - 10 reps - Seated Hip Abduction with Resistance  - 1 x daily - 7 x weekly - 3 sets - 10 reps - Seated March with Resistance  - 1 x daily - 7 x weekly - 3 sets - 10 reps - Sitting Knee Extension with Resistance  - 1 x daily - 7 x weekly - 3 sets - 10 reps - Sit to Stand with Arms Crossed  - 1 x daily - 7 x weekly - 3 sets - 10 reps  Pt performed above exercises with cues for set up and mechanics   Patient supine in bed at end of session with brakes locked, and all needs within reach.        Therapy  Documentation Precautions:  Precautions Precautions: Fall Recall of Precautions/Restrictions: Intact Restrictions Weight Bearing Restrictions Per Provider Order: No  Therapy/Group: Individual Therapy  Karsyn Jamie PTA 10/07/2024, 12:41 PM

## 2024-10-07 NOTE — Progress Notes (Signed)
 Occupational Therapy Session Note  Patient Details  Name: Justin Hurst MRN: [id number] Date of Birth: 08-01-1956  Today's Date: 10/07/2024 OT Individual Time: 1120-1200 OT Individual Time Calculation (min): 40 min    Short Term Goals: Week 1:  OT Short Term Goal 1 (Week 1): patient will complete toilet transfer mod A with increased body awareness OT Short Term Goal 1 - Progress (Week 1): Met OT Short Term Goal 2 (Week 1): patient will demonstrate ability to complete UB dressing with increased dynamic balance with SUP sitting on EOB OT Short Term Goal 2 - Progress (Week 1): Met OT Short Term Goal 3 (Week 1): patient will complete LB dressing standing to pull pants up with min A with increased standing balance OT Short Term Goal 3 - Progress (Week 1): Met Week 2:  OT Short Term Goal 1 (Week 2): Pt will complete w/c to toilet transfers with supervision. OT Short Term Goal 2 (Week 2): Pt will toilet with supervision. OT Short Term Goal 3 (Week 2): Pt will don clothing with distant S. OT Short Term Goal 4 (Week 2): Pt will stand and hold balance during self care at the sink without UE support with Supervision.   Skilled Therapeutic Interventions/Progress Updates:    Pt seen this session to have pt practice management of wc leg rests. His new chair has elevating leg rests which pt does not need and are heavier. Have asked to have them changed to standard.  From wc pt demonstrated that he was able to safely stand pivot wc to toilet, sit to stand from toilet and pivot back to wc with grab bar without A or S needed.  Pt able to dress self and toilet with mod ind.  Pt practiced stepping over shower threshold with bar 5x with good control.  Ambulated from room to day room with RW with CGA to occasional min A when pt would lean to his R. He needed cues to not push walker too far forward.    Pt rested and then walked halfway back.    Reminded pt to only ambulate with his wife at home, if he is  alone to ONLY do squat or stand pivots from the w/c to the toilet.    Pt in room with all needs met.   Therapy Documentation Precautions:  Precautions Precautions: Fall Recall of Precautions/Restrictions: Intact Restrictions Weight Bearing Restrictions Per Provider Order: No Pain: Pain Assessment Pain Scale: 0-10 Pain Score: 0-No pain ADL: ADL Equipment Provided: Reacher Eating: Independent Where Assessed-Eating: Chair Grooming: Independent Where Assessed-Grooming: Sitting at sink Upper Body Bathing: Setup Where Assessed-Upper Body Bathing: Shower Lower Body Bathing: Setup Where Assessed-Lower Body Bathing: Shower Upper Body Dressing: Independent Where Assessed-Upper Body Dressing: Edge of bed Lower Body Dressing: Modified independent Where Assessed-Lower Body Dressing: Edge of bed Toileting: Modified independent Where Assessed-Toileting: Teacher, Adult Education: Engineer, Agricultural Method: Surveyor, Minerals: Acupuncturist: Close supervision Film/video Editor Method: Warden/ranger: Shower seat with back, Grab bars   Therapy/Group: Individual Therapy  Atzel Mccambridge 10/07/2024, 12:32 PM

## 2024-10-07 NOTE — Progress Notes (Signed)
 Inpatient Rehabilitation Discharge Medication Review by a Pharmacist  A complete drug regimen review was completed for this patient to identify any potential clinically significant medication issues.  High Risk Drug Classes Is patient taking? Indication by Medication  Antipsychotic Yes Quetiapine  - agitation  Anticoagulant No   Antibiotic No   Opioid No   Antiplatelet No   Hypoglycemics/insulin No   Vasoactive Medication No   Chemotherapy No   Other Yes Atorvastatin  - HLD Sertraline  - mood Dexamethasone - radiation necrosis  Pantoprazole  - Reflux     Type of Medication Issue Identified Description of Issue Recommendation(s)  Drug Interaction(s) (clinically significant)     Duplicate Therapy     Allergy     No Medication Administration End Date     Incorrect Dose     Additional Drug Therapy Needed     Significant med changes from prior encounter (inform family/care partners about these prior to discharge).    Other       Clinically significant medication issues were identified that warrant physician communication and completion of prescribed/recommended actions by midnight of the next day:  No  Name of provider notified for urgent issues identified:   Provider Method of Notification:     Pharmacist comments: None  Time spent performing this drug regimen review (minutes):  20 minutes  Thank you. Olam Monte, PharmD

## 2024-10-07 NOTE — Progress Notes (Signed)
 Occupational Therapy Discharge Summary  Patient Details  Name: Justin Hurst MRN: [id number] Date of Birth: 1956-03-30  Date of Discharge from OT service:October 07, 2024    Patient has met 8 of 8 long term goals due to improved activity tolerance, improved balance, postural control, ability to compensate for deficits, improved attention, improved awareness, and improved coordination.    Patient to discharge at overall Supervision level for bathing, mod ind for dressing and toileting. Mod ind for wc to toilet transfers. Reminded pt to only ambulate with his wife at home, if he is alone to ONLY do squat or stand pivots from the w/c to the toilet.   Patient's care partner is independent to provide the necessary physical and cognitive assistance at discharge.  [name] has attended 2 family education sessions.  Reasons goals not met: n/a  Recommendation:  No further OT needed at this time. Pt will attend outpt PT to continue to work on balance and ambulation.   Equipment: No equipment provided  Reasons for discharge: treatment goals met  Patient/family agrees with progress made and goals achieved: Yes  OT Discharge Precautions/Restrictions  Precautions Precautions: Fall Restrictions Weight Bearing Restrictions Per Provider Order: No  ADL ADL Equipment Provided: Reacher Eating: Independent Where Assessed-Eating: Chair Grooming: Independent Where Assessed-Grooming: Sitting at sink Upper Body Bathing: Setup Where Assessed-Upper Body Bathing: Shower Lower Body Bathing: Setup Where Assessed-Lower Body Bathing: Shower Upper Body Dressing: Independent Where Assessed-Upper Body Dressing: Edge of bed Lower Body Dressing: Modified independent Where Assessed-Lower Body Dressing: Edge of bed Toileting: Modified independent Where Assessed-Toileting: Teacher, Adult Education: Engineer, Agricultural Method: Surveyor, Minerals: Pensions Consultant: Close supervision Film/video Editor Method: Warden/ranger: Information systems manager with back, Grab bars Vision Baseline Vision/History: 1 Wears glasses Patient Visual Report: No change from baseline Additional Comments: blurry vision fluctuates, pt will f/u with opthamologist Perception  Perception-Other Comments: spatial orientation improved from admission, pt occasionally has a R lean with ambulation Praxis Praxis: WFL Cognition Cognition Overall Cognitive Status: Within Functional Limits for tasks assessed Safety/Judgment: Appears intact Brief Interview for Mental Status (BIMS) Repetition of Three Words (First Attempt): 3 Temporal Orientation: Year: Correct Temporal Orientation: Month: Accurate within 5 days Temporal Orientation: Day: Correct Recall: Sock: Yes, no cue required Recall: Blue: Yes, no cue required Recall: Bed: Yes, no cue required BIMS Summary Score: 15 Sensation Sensation Light Touch: Impaired by gross assessment Hot/Cold: Appears Intact Proprioception: Impaired by gross assessment Stereognosis: Appears Intact Additional Comments: reports numbness in R side of face and RUE; proprioception improved since admission but he continues to have min deficits Coordination Gross Motor Movements are Fluid and Coordinated: No Fine Motor Movements are Fluid and Coordinated: Yes Coordination and Movement Description: decreased coordination especially in LE Motor  Motor Motor - Discharge Observations: apraxia and ataxia improved from admission but continues to have deficits with dynamic standing and ambulation     Trunk/Postural Assessment  Postural Control Postural Control: Deficits on evaluation Righting Reactions: impaired, tends to lean to the R Protective Responses: delayed  Balance Static Sitting Balance Static Sitting - Level of Assistance: 7: Independent Dynamic Sitting Balance Dynamic Sitting - Level of Assistance:  6: Modified independent (Device/Increase time) Static Standing Balance Static Standing - Level of Assistance: 7: Independent Dynamic Standing Balance Dynamic Standing - Level of Assistance: 6: Modified independent (Device/Increase time) (mod ind with standing during clothing management for toileting and LB dressing) Extremity/Trunk Assessment RUE Assessment RUE Assessment:  Within Functional Limits LUE Assessment LUE Assessment: Within Functional Limits   Candelario Steppe 10/07/2024, 12:40 PM

## 2024-10-08 ENCOUNTER — Other Ambulatory Visit (HOSPITAL_COMMUNITY): Payer: Self-pay

## 2024-10-08 DIAGNOSIS — I63011 Cerebral infarction due to thrombosis of right vertebral artery: Secondary | ICD-10-CM | POA: Diagnosis not present

## 2024-10-08 DIAGNOSIS — D649 Anemia, unspecified: Secondary | ICD-10-CM | POA: Insufficient documentation

## 2024-10-08 DIAGNOSIS — M25512 Pain in left shoulder: Secondary | ICD-10-CM | POA: Insufficient documentation

## 2024-10-08 DIAGNOSIS — K219 Gastro-esophageal reflux disease without esophagitis: Secondary | ICD-10-CM | POA: Insufficient documentation

## 2024-10-08 DIAGNOSIS — F32A Depression, unspecified: Secondary | ICD-10-CM | POA: Insufficient documentation

## 2024-10-08 NOTE — Progress Notes (Signed)
 Inpatient Rehabilitation Care Coordinator Discharge Note   Patient Details  Name: Justin Hurst MRN: [id number] Date of Birth: 10-30-1955   Discharge location: HOME WITH WIFE WHO DOES WORK PART TIME  BUT WILL TAKE OFF TO ASSIST FOR SHORT TIME  Length of Stay: 18 DAYS  Discharge activity level: MOD/I WHEELCHAIR AND CAG AMBULATION  Home/community participation: ACTIVE  Patient response un:Yzjouy Literacy - How often do you need to have someone help you when you read instructions, pamphlets, or other written material from your doctor or pharmacy?: Never  Patient response un:Dnrpjo Isolation - How often do you feel lonely or isolated from those around you?: Never  Services provided included: MD, RD, PT, OT, SLP, RN, CM, TR, Pharmacy, SW  Financial Services:  Field Seismologist Utilized: Private Insurance [facility] MEDICARE  Choices offered to/list presented to: PT AND WIFE  Follow-up services arranged:  Outpatient, DME, Patient/Family has no preference for HH/DME agencies    Outpatient Servicies: [facility]  PT  & OT WILL CALL WIFE TO SET UP FOLLOW UP APPOINTMENTS DME : [facility]  WHEELCHAIR HAS OTHER EQUIPMENT FROM PAST ADMISSIONS    Patient response to transportation need: Is the patient able to respond to transportation needs?: Yes In the past 12 months, has lack of transportation kept you from medical appointments or from getting medications?: No In the past 12 months, has lack of transportation kept you from meetings, work, or from getting things needed for daily living?: No   Patient/Family verbalized understanding of follow-up arrangements:  Yes  Individual responsible for coordination of the follow-up plan: DONNA-WIFE  [phone]  Confirmed correct DME delivered: Raymonde Asberry MATSU 10/08/2024    Comments (or additional information): WIFE WAS IN FOR HANDS ON EDUCATION BOTH FEEL READY TO GO HOME AND WHEN PT ALONE AT HOME WILL STAY IN [facility]  AND IS MOD/I WITH THIS DEVICE  Summary of Stay    Date/Time Discharge Planning CSW  10/06/24 9093 Wife came in Sat to observe in therapies and is coming back today to complete it. Wife works and will be with a few days for transition and pt has reached mod/i wheelchair level if home alone. Have ordered wheelchair and will set up [employer] or Op whichever one is recommended BGD  09/29/24 0844 Home with wife and daughter to assist will need to confirm if can provide 24/7 care due to wife and daughter work. Will get in for family training closer to [place] and await team's recommendations BGD  09/22/24 1012 D/c to home with his wife as chiropodist caregiver. Wife works PT, 3hrs per day. Will need intermittent supervision at discarge. Has a dtr that lives in [place] and she can help PRN. SW will confirm there are no barriers to discharge. AAC       Balen Woolum G

## 2024-10-08 NOTE — Progress Notes (Signed)
 "                                                        PROGRESS NOTE   Subjective/Complaints:  No issues overnite   ROS: Patient denies CP, SOB, N/V/D  Objective:   No results found.  Recent Labs    10/07/24 0422  WBC 8.4  HGB 12.9*  HCT 38.5*  PLT 240     Recent Labs    10/07/24 0422  NA 138  K 4.6  CL 103  CO2 27  GLUCOSE 89  BUN 20  CREATININE 0.88  CALCIUM  9.2      Intake/Output Summary (Last 24 hours) at 10/08/2024 9072 Last data filed at 10/08/2024 0800 Gross per 24 hour  Intake 708 ml  Output 2425 ml  Net -1717 ml        Physical Exam: Vital Signs Blood pressure (!) 142/80, pulse 61, temperature (!) 97.5 F (36.4 C), temperature source Oral, resp. rate 16, height 5' 7 (1.702 m), weight 92.6 kg, SpO2 94%.   General: No acute distress Mood and affect are appropriate Heart: Regular rate and rhythm no rubs murmurs or extra sounds Lungs: Clear to auscultation, breathing unlabored, no rales or wheezes Abdomen: Positive bowel sounds, soft nontender to palpation, nondistended    Assessment/Plan: 1. 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 Stable for D/C today F/u PCP in 3-4 weeks F/u Dr Buckley F/u PM&R 2 weeks See D/C summary See D/C instructions   Care Tool:  Bathing    Body parts bathed by patient: Right arm, Left arm, Chest, Abdomen, Front perineal area, Right upper leg, Left upper leg, Face, Left lower leg, Buttocks, Right lower leg   Body parts bathed by helper: Right lower leg     Bathing assist Assist Level: Set up assist     Upper Body Dressing/Undressing Upper body dressing   What is the patient wearing?: Pull over shirt    Upper body assist Assist Level: Independent    Lower Body Dressing/Undressing Lower body dressing      What is the patient wearing?: Underwear/pull up, Pants     Lower body assist Assist for lower body dressing: Independent with assitive device     Toileting Toileting     Toileting assist Assist for toileting: Independent with assistive device     Transfers Chair/bed transfer  Transfers assist     Chair/bed transfer assist level: Supervision/Verbal cueing     Locomotion Ambulation   Ambulation assist      Assist level: Contact Guard/Touching assist Assistive device: Walker-rolling Max distance: >150'   Walk 10 feet activity   Assist     Assist level: Contact Guard/Touching assist Assistive device: Walker-rolling   Walk 50 feet activity   Assist    Assist level: Contact Guard/Touching assist Assistive device: Walker-rolling    Walk 150 feet activity   Assist Walk 150 feet activity did not occur: Safety/medical concerns  Assist level: Contact Guard/Touching assist Assistive device: Walker-rolling    Walk 10 feet on uneven surface  activity   Assist Walk 10 feet on uneven surfaces activity did not occur: Safety/medical concerns   Assist level: Contact Guard/Touching assist Assistive device: Walker-rolling   Wheelchair     Assist Is the patient using a wheelchair?: Yes Type of Wheelchair: Manual  Wheelchair assist level: Supervision/Verbal cueing Max wheelchair distance: 150    Wheelchair 50 feet with 2 turns activity    Assist        Assist Level: Supervision/Verbal cueing   Wheelchair 150 feet activity     Assist      Assist Level: Supervision/Verbal cueing   Blood pressure (!) 142/80, pulse 61, temperature (!) 97.5 F (36.4 C), temperature source Oral, resp. rate 16, height 5' 7 (1.702 m), weight 92.6 kg, SpO2 94%.   Medical Problem List and Plan: 1. 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  Antithrombotics: -DVT/anticoagulation:  Pharmaceutical: Lovenox               -antiplatelet therapy: ASA 3. Pain Management: Tylenol  prn 4. Mood/Behavior/Sleep: LCSW to follow for evaluation and support.              -antipsychotic agents: Seroquel  at bedtime (was increased at [facility]).             --doing well with Seroquel  and melatonin for sleep.  5. Neuropsych/cognition: This patient may be intermittently capable of making decisions on his own behalf. 6. Skin/Wound Care: Routine pressure relief measures. 7. Fluids/Electrolytes/Nutrition: Monitor I/O 8.  Right temporal lobe clival chordoma/radiation necrosis: Decadron  tapered to 2 mg daily since 12/21 Repeat CT head shows mild improvement with cerebral edema on lower dose of decadron   9. GERD: On Protonix  10. AKI; BUN/Scr now within normal range    Latest Ref Rng & Units 10/07/2024    4:22 AM 10/04/2024    5:38 AM 09/30/2024    5:43 AM  BMP  Glucose 70 - 99 mg/dL 89  91  90   BUN 8 - 23 mg/dL 20  22  25    Creatinine 0.61 - 1.24 mg/dL 9.11  9.06  9.03   Sodium 135 - 145 mmol/L 138  138  140   Potassium 3.5 - 5.1 mmol/L 4.6  4.2  4.1   Chloride 98 - 111 mmol/L 103  103  103   CO2 22 - 32 mmol/L 27  27  28    Calcium  8.9 - 10.3 mg/dL 9.2  8.8  8.9     11. Chronic right RTC injury: OT/PT to address  12. Depression: On zoloft  100 mg daily 13.  Constipation: Has been refusing laxative. Dc'd miralax .  -had bm 1/4  14.  Bilateral ankle fusion limiting ability to compensate for stroke related balance deficit--   LOS: 18 days A FACE TO FACE EVALUATION WAS PERFORMED  Justin Hurst 10/08/2024, 9:27 AM     "

## 2024-10-08 NOTE — Discharge Summary (Signed)
 Physician Discharge Summary  Patient ID: Justin Hurst MRN: [id number] DOB/AGE: [birth year] 69 y.o.  Admit date: 09/20/2024 Discharge date: 10/08/2024  Discharge Diagnoses:  Principal Problem:   Stroke (cerebrum) (HCC) Active Problems:   Post-traumatic osteoarthritis of left foot   Radiation therapy induced brain necrosis   Left shoulder pain   Anemia   GERD (gastroesophageal reflux disease)   Depression   Discharged Condition: stable  Significant Diagnostic Studies: CT HEAD WO CONTRAST ( ) Result Date: 10/01/2024 EXAM: CT HEAD WITHOUT CONTRAST 09/30/2024 05:18:19 PM TECHNIQUE: CT of the head was performed without the administration of intravenous contrast. Automated exposure control, iterative reconstruction, and/or weight based adjustment of the mA/kV was utilized to reduce the radiation dose to as low as reasonably achievable. COMPARISON: None available. 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 Intermittent vision changes today--follow up on cerebral edema. FINDINGS: BRAIN AND VENTRICLES: In comparison across modalities to 09/06/24 MRI the edema in the right temporal lobe appears mildly improved. Additional moderate patchy white matter hypoattenuities are present compatible with chronic microvascular ischemic change. No acute hemorrhage. No evidence of acute infarct. No hydrocephalus. No extra-axial collection. No mass effect or midline shift. ORBITS: No acute abnormality. SINUSES: Postoperative changes of the clivus and posterior sphenoid sinus wall. SOFT TISSUES AND SKULL: No acute soft tissue abnormality. No skull fracture. IMPRESSION: 1. When comparing across modalities to 09/06/24 MRI the edema in the right temporal lobe appears mildly improved. Repeat MRI could provide more direct/confident comparison if clinically warranted. 2. No evidence of interval acute abnormality by CT. Electronically signed by: Gilmore Molt MD MD 10/01/2024 01:50 AM EST RP  Workstation: [id number]    Labs:  Basic Metabolic Panel:    Latest Ref Rng & Units 10/07/2024    4:22 AM 10/04/2024    5:38 AM 09/30/2024    5:43 AM  BMP  Glucose 70 - 99 mg/dL 89  91  90   BUN 8 - 23 mg/dL 20  22  25    Creatinine 0.61 - 1.24 mg/dL 9.11  9.06  9.03   Sodium 135 - 145 mmol/L 138  138  140   Potassium 3.5 - 5.1 mmol/L 4.6  4.2  4.1   Chloride 98 - 111 mmol/L 103  103  103   CO2 22 - 32 mmol/L 27  27  28    Calcium  8.9 - 10.3 mg/dL 9.2  8.8  8.9      CBC:    Latest Ref Rng & Units 10/07/2024    4:22 AM 10/04/2024    5:38 AM 09/30/2024    5:43 AM  CBC  WBC 4.0 - 10.5 K/uL 8.4  9.9  8.5   Hemoglobin 13.0 - 17.0 g/dL 87.0  87.0  87.3   Hematocrit 39.0 - 52.0 % 38.5  39.2  38.6   Platelets 150 - 400 K/uL 240  245  246      CBG: No results for input(s): GLUCAP in the last 168 hours.  Brief HPI:   Justin Hurst is a 69 y.o. 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  He started developing right frontal headache with blurry and double vision on the right, decreased balance and multiple falls with word finding deficits despite addition  of Decadron  2 mg.  He was admitted to [facility] on 09/06/2024 with weakness, ataxia and concerns of worsening of radiation necrosis.  MRI brain was repeated on 12/16 showing increase in size of right medial temporal lobe enhancement and increase in surrounding vasogenic edema compatible with tumor necrosis and new small foci of diffusion restriction in right lateral medulla compatible with recent infarct.  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  BLE Dopplers were negative for DVT in  visualized veins.    Radiation necrosis was treated with increased dose of Decadron  which was tapered down to 2 mg daily and is to continue till follow-up with Dr. Buckley.  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  Hospital course was significant for issues with hallucination, myoclonus as well as insomnia felt to be due to delirium.  Headaches and right visual field symptoms were improved.  He was tolerating regular diet.  Therapy was consulted and was working with patient was limited by neuropathy, right lower extremity ataxia, issues with truncal control and was working on sitting at edge of bed as well as pre-gait activity.  He was able to follow one-step command consistently but had poor awareness of deficits.  He was independent prior to recent decline and intensive rehab program was recommended by therapy.   Hospital Course: Justin Hurst was admitted to rehab 09/20/2024 for inpatient therapies to consist of PT, ST and OT at least three hours five days a week. Past admission physiatrist, therapy team and rehab RN have worked together to provide customized collaborative inpatient rehab.   He continues on ASA and low dose decadron  and is tolerating this without SE. His po intake has been good and he's continent of bowel and bladder. His blood pressures were monitored on TID basis and has been controlled. Voltaren  gel added for left shoulder pain and has resolved. Insomnia has been managed on current dose melatonin and Seroquel  without any recurrent hallucinations or confusion. He did report visual changes with some decrease in vision on 01/08 and CT head repeated showing mild improvement in edema.        Rehab course: During patient's stay in rehab weekly team conferences were held to monitor patient's progress, set goals and discuss barriers to  discharge. At admission, patient required  He  has had improvement in activity tolerance, balance, postural control as well as ability to compensate for deficits. He requires supervision for bathing and is modified independent for bathing and toileting.  He requires supervision with cues for transfers and CGA to ambulate 150' with use of RW. He is able to propel his wheelchair for 150' with cues. Family education has been completed.     Discharge disposition: 01-Home or Self Care  Diet: Regular  Special Instructions: No driving or strenuous activity till cleared by MD.   Discharge Instructions     Ambulatory referral to Physical Medicine Rehab   Complete by: As directed    4 week follow up   Ambulatory referral to Physical Therapy   Complete by: As directed    Eval and treat      Allergies as of 10/08/2024   No Known Allergies      Medication List     STOP taking these medications    ALPRAZolam  1 MG tablet Commonly known as:  XANAX    amoxicillin 500 MG capsule Commonly known as: AMOXIL   bisacodyl  10 MG suppository Commonly known as: DULCOLAX   enoxaparin  40 MG/0.4ML injection Commonly known as: LOVENOX    polyethylene glycol powder 17 GM/SCOOP powder Commonly known as: GLYCOLAX /MIRALAX        TAKE these medications    acetaminophen  325 MG tablet Commonly known as: TYLENOL  Take 1-2 tablets (325-650 mg total) by mouth every 4 (four) hours as needed for mild pain (pain score 1-3). What changed:  how much to take when to take this reasons to take this   aspirin  EC 81 MG tablet Take 1 tablet (81 mg total) by mouth daily.   atorvastatin  80 MG tablet Commonly known as: LIPITOR Take 1 tablet (80 mg total) by mouth daily.   carboxymethylcellulose 0.5 % Soln Commonly known as: REFRESH PLUS Place 1 drop into both eyes in the morning, at noon, in the evening, and at bedtime. What changed:  when to take this reasons to take this   dexamethasone  2 MG  tablet Commonly known as: DECADRON  Take 1 tablet (2 mg total) by mouth daily.   diclofenac  Sodium 1 % Gel Commonly known as: VOLTAREN  Apply 2 g topically 4 (four) times daily. To right shoulder--works best when used 3-4 times a day. Can use as needed once pain improves   melatonin 3 MG Tabs tablet Take 1 tablet (3 mg total) by mouth at bedtime.   pantoprazole  40 MG tablet Commonly known as: PROTONIX  Take 1 tablet (40 mg total) by mouth daily.   QUEtiapine  25 MG tablet Commonly known as: SEROQUEL  Take 1.5 tablets (37.5 mg total) by mouth at bedtime. What changed: medication strength   sennosides-docusate sodium  8.6-50 MG tablet Commonly known as: SENOKOT-S Take 2 tablets by mouth daily as needed for constipation. What changed:  how much to take when to take this reasons to take this   sertraline  100 MG tablet Commonly known as: ZOLOFT  Take 1 tablet (100 mg total) by mouth at bedtime. Note that dose was decreased to one pill at bedtime. What changed:  how much to take additional instructions   sodium chloride  0.65 % Soln nasal spray Commonly known as: OCEAN Place 1 spray into both nostrils as needed for congestion.   thiamine  100 MG tablet Commonly known as: VITAMIN B1 Take 100 mg by mouth daily.        Follow-up Information     Marvene Prentice SAUNDERS, FNP Follow up.   Specialty: Family Medicine Why: Call in 1-2 days for post hospital follow up Contact information: [address] [place] [address] [phone]         Carilyn Prentice BRAVO, MD Follow up.   Specialty: Physical Medicine and Rehabilitation Why: office will call you with follow up appointment Contact information: [address] [place] [address] [phone]         Buckley Arthea POUR, MD Follow up.   Specialties: Psychiatry, Neurology, Oncology Contact information: [address] [place] [address] [phone]         Doristine Morita  [facility] Follow up.   Contact information: [address] [place] [address] [phone]                 Signed: Sharlet GORMAN Schmitz 10/08/2024, 11:05 AM

## 2024-10-11 ENCOUNTER — Other Ambulatory Visit: Payer: Self-pay | Admitting: Internal Medicine

## 2024-10-11 DIAGNOSIS — I6789 Other cerebrovascular disease: Secondary | ICD-10-CM

## 2024-10-11 DIAGNOSIS — C410 Malignant neoplasm of bones of skull and face: Secondary | ICD-10-CM

## 2024-10-22 NOTE — Therapy (Signed)
 " OUTPATIENT PHYSICAL THERAPY NEURO EVALUATION   Patient Name: Justin Hurst MRN: [id number] DOB:May 12, 1956, 69 y.o., male Today's Date: 10/26/2024   PCP: Marvene Prentice SAUNDERS, FNP  REFERRING PROVIDER: Maurice Sharlet RAMAN, PA-C  END OF SESSION:  PT End of Session - 10/26/24 1615     Visit Number 1    Number of Visits 13    Date for Recertification  12/09/24    Authorization Type [employer]    Authorization Time Period auth submitted    PT Start Time 1533    PT Stop Time 1611    PT Time Calculation (min) 38 min    Equipment Utilized During Treatment Gait belt    Activity Tolerance Patient tolerated treatment well    Behavior During Therapy WFL for tasks assessed/performed          Past Medical History:  Diagnosis Date   Arthritis    Brain cancer (HCC)    Cancer (HCC)    hx of brain cancer- tx in 2023   Dyspnea    GERD (gastroesophageal reflux disease)    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 Ernie Left Knee 03-09-18    KNEE ARTHROPLASTY     x2    KNEE ARTHROSCOPY     Left x3   ORIF FEMUR FRACTURE Left 03/09/2018   Procedure: Left knee femoral revision with open reduction internal fixation of distal femur periprosthetic fracture;  Surgeon: Ernie Cough, MD;  Location: [facility];  Service: Orthopedics;  Laterality: Left;  Adductor Block   ORIF FEMUR FRACTURE Left 06/26/2023   Procedure: OPEN REDUCTION INTERNAL FIXATION (ORIF) DISTAL FEMUR FRACTURE;  Surgeon: Celena Sharper, MD;  Location: [facility];  Service: Orthopedics;  Laterality: Left;   SHOULDER ARTHROSCOPY     Left   TOTAL KNEE ARTHROPLASTY Right 02/24/2024   Procedure: ARTHROPLASTY, KNEE, TOTAL;  Surgeon: Ernie Cough, MD;  Location: [facility];  Service: Orthopedics;  Laterality: Right;   TUMOR EXCISION  06/15/2022   brain   Patient Active Problem List    Diagnosis Date Noted   Left shoulder pain 10/08/2024   Anemia 10/08/2024   GERD (gastroesophageal reflux disease) 10/08/2024   Depression 10/08/2024   Stroke (cerebrum) (HCC) 09/20/2024   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   S/P total knee arthroplasty, right 02/24/2024   S/P total knee replacement, right 02/24/2024   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   Osteoarthritis 11/09/2021   Chronic pain syndrome 10/02/2018   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 03/09/2018   Acute pain of left knee 02/13/2018   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   Localized osteoarthritis of left ankle 11/11/2016   Mass of joint of left ankle 11/11/2016    ONSET DATE: 09/06/24  REFERRING DIAG: P36.458 (ICD-10-CM) - 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 Patient reports that he was independent and driving but retired prior to recent hospitalization. Was not using AD at [facility]. Since hospitalization, using RW at times and notices leaning to the R. Also reports that the R side of face, shoulder, and leg feel weak and numb. Denies diplopia or dizziness. Confirms that strength and balance seem to be more of an issue. Reports multiple orthopedic surgeries that have thrown his balance off as well. Reports several falls too many to count and tendency to fall forward. Reports that he has a hx of dislocating  his R shoulder from a fall. Reports arthritis in hands which makes fine motor movements difficult. Also reports that he is noticing choking on food a little more and notes changes in speech.    Pt accompanied by: self  PERTINENT HISTORY: Brain CA, B ankle fusions, L knee replacement s/p revision, R TKA, L femur ORIF  Per chart  He was admitted to [facility] on 09/06/2024 with weakness, ataxia and concerns of worsening of radiation necrosis.  MRI brain was repeated on 12/16 showing increase in size of right medial temporal lobe enhancement and increase in surrounding vasogenic edema compatible with tumor necrosis and new small foci of diffusion restriction in right lateral medulla compatible with recent infarct.  PAIN:  Are you having pain? Reports chronic pain from hx multiple surgeries  PRECAUTIONS: Fall  RED FLAGS: None   WEIGHT BEARING RESTRICTIONS: No  FALLS: Has patient fallen in last 6 months? Yes. Number of falls too many to count  LIVING ENVIRONMENT: Lives with: lives with their spouse Lives in: House/apartment Stairs: 7 steps to enter, 13 steps to enter from side door; 15 to basement ; all have handrails Has following equipment at home: Single point cane, Quad cane small base, Walker - 2 wheeled, Wheelchair (manual), bed side commode, and Grab bars  PLOF: Independent, Vocation/Vocational requirements: retired, and Leisure: working in the yard; walking  PATIENT GOALS: improving R side strength and balance   OBJECTIVE:  Note: Objective measures were completed at Evaluation unless otherwise noted.  DIAGNOSTIC FINDINGS: 09/30/24 head CT: When comparing across modalities to 09/06/24 MRI the edema in the right temporal lobe appears mildly improved. Repeat MRI could provide more direct/confident comparison if clinically warranted. 2. No evidence of interval acute abnormality by CT.  COGNITION: Overall cognitive status: Within functional limits for tasks  assessed   SENSATION: Diminished to light touch on R foot  COORDINATION: Alternating pronation/supination: intact B Alternating toe tap: intact B Finger to nose: possible very mild dysmetria R   MUSCLE TONE: intact B LEs    POSTURE: elevated shoulders  LOWER EXTREMITY ROM:     Active  Right Eval Left Eval  Hip flexion    Hip extension    Hip abduction    Hip adduction    Hip internal rotation    Hip external rotation    Knee flexion    Knee extension    Ankle dorsiflexion 17 10  Ankle plantarflexion    Ankle inversion    Ankle eversion     (Blank rows = not tested)  LOWER EXTREMITY MMT:    MMT Right Eval Left Eval  Hip flexion 4+ 4+  Hip extension    Hip abduction 4+ 4+  Hip adduction 4 4  Hip internal rotation    Hip external rotation    Knee flexion  4 4  Knee extension 4+ 5  Ankle dorsiflexion 4+ 4+  Ankle plantarflexion 4+ 4+  Ankle inversion    Ankle eversion    (Blank rows = not tested)  GAIT: Findings: Assistive device utilized:None, Level of assistance: CGA and Min A, and Comments: trunk flexed, L toe out and reduced L knee flexion during swing. Limited R spatial awareness and PT provides min A to ensure pt does not hit items on his L side. Pt reported fatigue after 134ft   FUNCTIONAL TESTS:  5 times sit to stand: 11.03 sec without UE and not standing fully upright  10 meter walk test: 15.07 sec, 2.18 ft/sec                                                                                                                               TREATMENT DATE: 10/26/24    PATIENT EDUCATION: Education details: edu on benefits of OT and ST for his mentioned concerns- pt agreeable; prognosis, POC, HEP with edu for safety, edu on some R sided neglect/reduced spatial awareness and how exam findings relate to functional impairments  Person educated: Patient Education method: Explanation, Demonstration, Tactile cues, Verbal cues, and Handouts Education  comprehension: verbalized understanding and returned demonstration  HOME EXERCISE PROGRAM: Access Code: [email or web address] URL: [email or web address] Date: 10/26/2024 Prepared by: [facility] - Outpatient  Rehab - [facility]  Program Notes practice walking in your house for 1-2 minutes with walker for exercise. perform 3x/day   Exercises - Sit to Stand Without Arm Support  - 1 x daily - 5 x weekly - 2 sets - 5-10 reps - Standing March with Counter Support  - 1 x daily - 5 x weekly - 2-3 sets - 30 sec hold  GOALS: Goals reviewed with patient? Yes  SHORT TERM GOALS: Target date: 11/16/24  Patient to be independent with initial HEP. Baseline: HEP initiated Goal status: INITIAL    LONG TERM GOALS: Target date: 12/09/2024  Patient to be independent with advanced HEP. Baseline: Not yet initiated  Goal status: INITIAL  Patient to demonstrate B LE strength >/=4+/5.  Baseline: See above Goal status: INITIAL  goal to be created. Baseline: - Goal status: INITIAL  Patient to demonstrate gait speed of at least 2.62 ft/sec ft/sec in order to improve access to community.  Baseline: 2.1 ft/sec Goal status: INITIAL  Patient to demonstrate 5xSTS test in <15 sec with full upright posture in order to decrease risk of falls.  Baseline: 11 sec and not standing fully  Goal status: INITIAL  Berg goal to be created. Baseline: - Goal status: INITIAL  Patient to report understanding of fall prevention information in the home.   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 Patient reports multiple falls in the past 6 months as well as fine motor, speech, and swallowing concerns. He reports that he  was independent and driving at [facility].  Patient today presenting with Reduced R LE sensation, slight R UE dysmetria, mild decreased LE strength, gait deviations and reduced R spatial awareness, imbalance, and decreased gait speed. Patient was educated on gentle endurance HEP and reported understanding. Would benefit from skilled PT services 2 x/week for 6 weeks to address aforementioned impairments in order to optimize level of function.    OBJECTIVE IMPAIRMENTS: Abnormal gait, decreased activity tolerance, decreased balance, decreased coordination, decreased endurance, decreased knowledge of use of DME, difficulty walking, decreased strength, decreased safety awareness, postural dysfunction, and pain.   ACTIVITY LIMITATIONS: carrying, lifting, bending, standing, squatting, stairs, transfers, bed mobility, bathing, toileting, dressing, reach over head, hygiene/grooming, and locomotion level  PARTICIPATION LIMITATIONS: meal prep, cleaning, laundry, driving, shopping, community activity, yard work, and church  PERSONAL FACTORS: Age, Past/current experiences, Time since onset of injury/illness/exacerbation, and 3+ comorbidities: Brain CA, B ankle fusions, L knee replacement s/p revision, R TKA, L femur ORIF are also affecting patient's functional outcome.   REHAB POTENTIAL: Good  CLINICAL DECISION MAKING: Evolving/moderate complexity  EVALUATION COMPLEXITY: Moderate  PLAN:  PT FREQUENCY: 2x/week  PT DURATION: 6 weeks  PLANNED INTERVENTIONS: 97164- PT Re-evaluation, 97110-Therapeutic exercises, 97530- Therapeutic activity, 97112- Neuromuscular re-education, 97535- Self Care, [id number]- Manual therapy, [phone]- Gait training, [phone]- Canalith repositioning, [id number]- Aquatic Therapy, [phone] (1-2 muscles), 20561 (3+ muscles)- Dry Needling, Patient/Family education, Balance training, Stair training, Taping, Joint mobilization, Spinal mobilization, Vestibular training, Cryotherapy, and Moist heat  PLAN FOR NEXT  SESSION: Berg and create goal, review HEP, progress static and dynamic balance, gait training without AD vs. Trial cane; consider and create goal  Louana Terrilyn Christians, PT, DPT 10/26/24 4:42 PM  [facility] [address] [place], [place] [address] Phone # [phone] Fax # [phone]         "

## 2024-10-26 ENCOUNTER — Encounter: Payer: Self-pay | Admitting: Physical Therapy

## 2024-10-26 ENCOUNTER — Telehealth: Payer: Self-pay | Admitting: Physical Therapy

## 2024-10-26 ENCOUNTER — Other Ambulatory Visit: Payer: Self-pay

## 2024-10-26 ENCOUNTER — Ambulatory Visit: Admitting: Physical Therapy

## 2024-10-26 DIAGNOSIS — R2689 Other abnormalities of gait and mobility: Secondary | ICD-10-CM

## 2024-10-26 DIAGNOSIS — M6281 Muscle weakness (generalized): Secondary | ICD-10-CM

## 2024-10-26 DIAGNOSIS — I63541 Cerebral infarction due to unspecified occlusion or stenosis of right cerebellar artery: Secondary | ICD-10-CM

## 2024-10-26 DIAGNOSIS — R2681 Unsteadiness on feet: Secondary | ICD-10-CM

## 2024-10-28 NOTE — Addendum Note (Signed)
 Addended by: CARILYN JODIE BRAVO on: 10/28/2024 04:33 PM   Modules accepted: Orders

## 2024-10-29 ENCOUNTER — Other Ambulatory Visit

## 2024-10-29 NOTE — Therapy (Incomplete)
 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 Ernie Left Knee 03-09-18    KNEE ARTHROPLASTY     x2    KNEE ARTHROSCOPY     Left x3   ORIF FEMUR FRACTURE Left 03/09/2018   Procedure: Left knee femoral revision with open reduction internal fixation of distal femur periprosthetic fracture;  Surgeon: Ernie Cough, MD;  Location: [facility];  Service: Orthopedics;  Laterality: Left;  Adductor Block   ORIF FEMUR FRACTURE Left 06/26/2023   Procedure: OPEN REDUCTION INTERNAL FIXATION (ORIF) DISTAL FEMUR FRACTURE;  Surgeon: Celena Sharper, MD;  Location: [facility];  Service: Orthopedics;  Laterality: Left;   SHOULDER ARTHROSCOPY     Left   TOTAL KNEE ARTHROPLASTY Right 02/24/2024   Procedure: ARTHROPLASTY, KNEE, TOTAL;  Surgeon: Ernie Cough, MD;  Location: [facility];  Service: Orthopedics;  Laterality: Right;   TUMOR EXCISION  06/15/2022   brain   Patient Active Problem List   Diagnosis Date Noted   Left shoulder pain 10/08/2024   Anemia 10/08/2024   GERD (gastroesophageal reflux disease) 10/08/2024   Depression 10/08/2024   Stroke (cerebrum) (HCC) 09/20/2024   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   S/P total knee arthroplasty, right 02/24/2024   S/P total knee replacement, right 02/24/2024   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  Rationale for Evaluation and Treatment: Rehabilitation  SUBJECTIVE:  SUBJECTIVE STATEMENT: Patient reports that he has not been out since coming home from the hospital but is doing things pretty independently. Patient reports that he was independent and driving but retired prior to recent hospitalization. Was not using AD at [facility]. Since hospitalization, using RW at times and notices leaning to the R. Also reports that the R side of face, shoulder, and leg feel weak and numb. Denies diplopia or dizziness. Confirms that strength and balance seem to be more of an issue. Reports multiple orthopedic surgeries that have thrown his balance off as well. Reports several falls too many to count and tendency to fall forward. Reports that he has a hx of dislocating his R shoulder from a fall. Reports arthritis in hands which makes fine motor movements difficult. Also reports that he is noticing choking on food a little more and notes changes in speech.    Pt accompanied by: self  PERTINENT HISTORY: Brain CA, B ankle fusions, L knee replacement s/p revision, R TKA, L femur ORIF  Per chart  He was admitted to [facility] on 09/06/2024 with weakness, ataxia and concerns of worsening of radiation necrosis.  MRI brain was repeated on 12/16 showing increase in size of right medial temporal lobe  enhancement and increase in surrounding vasogenic edema compatible with tumor necrosis and new small foci of diffusion restriction in right lateral medulla compatible with recent infarct.  PAIN:  Are you having pain? Reports chronic pain from hx multiple surgeries  PRECAUTIONS: Fall  RED FLAGS: None   WEIGHT BEARING RESTRICTIONS: No  FALLS: Has patient fallen in last 6 months? Yes. Number of falls too many to count  LIVING ENVIRONMENT: Lives with: lives with their spouse Lives in: House/apartment Stairs: 7 steps to enter, 13 steps to enter from side door; 15 to basement ; all have handrails Has following equipment at home: Single point cane, Quad cane small base, Walker - 2 wheeled, Wheelchair (manual), bed side commode, and Grab bars  PLOF: Independent, Vocation/Vocational requirements: retired, and Leisure: working in the yard; walking  PATIENT GOALS: improving R side strength and balance   OBJECTIVE:       TODAY'S TREATMENT: 11/01/24 Activity Comments                           Note: Objective measures were completed at Evaluation unless otherwise noted.  DIAGNOSTIC FINDINGS: 09/30/24 head CT: When comparing across modalities to 09/06/24 MRI the edema in the right temporal lobe appears mildly improved. Repeat MRI could provide more direct/confident comparison if clinically warranted. 2. No evidence of interval acute abnormality by CT.  COGNITION: Overall cognitive status: Within functional limits for tasks assessed   SENSATION: Diminished to light touch on R foot  COORDINATION: Alternating pronation/supination: intact B Alternating toe tap: intact B Finger to nose: possible very mild dysmetria R   MUSCLE TONE: intact B LEs    POSTURE: elevated shoulders  LOWER EXTREMITY ROM:     Active  Right Eval Left Eval  Hip flexion    Hip extension    Hip abduction    Hip adduction    Hip internal rotation    Hip external rotation    Knee flexion     Knee extension    Ankle dorsiflexion 17 10  Ankle plantarflexion    Ankle inversion    Ankle eversion     (Blank rows = not tested)  LOWER EXTREMITY MMT:    MMT Right Eval Left  Eval  Hip flexion 4+ 4+  Hip extension    Hip abduction 4+ 4+  Hip adduction 4 4  Hip internal rotation    Hip external rotation    Knee flexion 4 4  Knee extension 4+ 5  Ankle dorsiflexion 4+ 4+  Ankle plantarflexion 4+ 4+  Ankle inversion    Ankle eversion    (Blank rows = not tested)  GAIT: Findings: Assistive device utilized:None, Level of assistance: CGA and Min A, and Comments: trunk flexed, L toe out and reduced L knee flexion during swing. Limited R spatial awareness and PT provides min A to ensure pt does not hit items on his L side. Pt reported fatigue after 160ft   FUNCTIONAL TESTS:  5 times sit to stand: 11.03 sec without UE and not standing fully upright  10 meter walk test: 15.07 sec, 2.18 ft/sec                                                                                                                               TREATMENT DATE: 10/26/24    PATIENT EDUCATION: Education details: edu on benefits of OT and ST for his mentioned concerns- pt agreeable; prognosis, POC, HEP with edu for safety, edu on some R sided neglect/reduced spatial awareness and how exam findings relate to functional impairments  Person educated: Patient Education method: Explanation, Demonstration, Tactile cues, Verbal cues, and Handouts Education comprehension: verbalized understanding and returned demonstration  HOME EXERCISE PROGRAM: Access Code: [email or web address] URL: [email or web address] Date: 10/26/2024 Prepared by: [facility] - Outpatient  Rehab - [facility]  Program Notes practice walking in your house for 1-2 minutes with walker for exercise. perform 3x/day   Exercises - Sit to Stand Without Arm Support  - 1 x daily - 5 x weekly - 2 sets - 5-10 reps - Standing March with Counter  Support  - 1 x daily - 5 x weekly - 2-3 sets - 30 sec hold  GOALS: Goals reviewed with patient? Yes  SHORT TERM GOALS: Target date: 11/16/24  Patient to be independent with initial HEP. Baseline: HEP initiated Goal status: IN PROGRESS    LONG TERM GOALS: Target date: 12/09/2024  Patient to be independent with advanced HEP. Baseline: Not yet initiated  Goal status: IN PROGRESS  Patient to demonstrate B LE strength >/=4+/5.  Baseline: See above Goal status: IN PROGRESS  goal to be created. Baseline: - Goal status: IN PROGRESS  Patient to demonstrate gait speed of at least 2.62 ft/sec ft/sec in order to improve access to community.  Baseline: 2.1 ft/sec Goal status: IN PROGRESS  Patient to demonstrate 5xSTS test in <15 sec with full upright posture in order to decrease risk of falls.  Baseline: 11 sec and not standing fully  Goal status: IN PROGRESS  Berg goal to be created. Baseline: - Goal status: IN PROGRESS  Patient to report understanding of fall prevention information in the home.   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 Patient reports multiple falls in the past 6 months as well as fine motor, speech, and swallowing concerns. He reports that he was independent and driving at [facility].  Patient today presenting with Reduced R LE sensation, slight R UE dysmetria, mild decreased LE strength, gait deviations and reduced R spatial awareness, imbalance, and decreased gait speed. Patient was educated on gentle endurance HEP and reported understanding. Would benefit from skilled PT services 2 x/week for 6 weeks to address aforementioned impairments in order to optimize level of function.     OBJECTIVE IMPAIRMENTS: Abnormal gait, decreased activity tolerance, decreased balance, decreased coordination, decreased endurance, decreased knowledge of use of DME, difficulty walking, decreased strength, decreased safety awareness, postural dysfunction, and pain.   ACTIVITY LIMITATIONS: carrying, lifting, bending, standing, squatting, stairs, transfers, bed mobility, bathing, toileting, dressing, reach over head, hygiene/grooming, and locomotion level  PARTICIPATION LIMITATIONS: meal prep, cleaning, laundry, driving, shopping, community activity, yard work, and church  PERSONAL FACTORS: Age, Past/current experiences, Time since onset of injury/illness/exacerbation, and 3+ comorbidities: Brain CA, B ankle fusions, L knee replacement s/p revision, R TKA, L femur ORIF are also affecting patient's functional outcome.   REHAB POTENTIAL: Good  CLINICAL DECISION MAKING: Evolving/moderate complexity  EVALUATION COMPLEXITY: Moderate  PLAN:  PT FREQUENCY: 2x/week  PT DURATION: 6 weeks  PLANNED INTERVENTIONS: 97164- PT Re-evaluation, 97110-Therapeutic exercises, 97530- Therapeutic activity, 97112- Neuromuscular re-education, 97535- Self Care, 02859- Manual therapy, [id number]- Gait training, [phone]- Canalith repositioning, [id number]- Aquatic Therapy, [phone] (1-2 muscles), 20561 (3+ muscles)- Dry Needling, Patient/Family education, Balance training, Stair training, Taping, Joint mobilization, Spinal mobilization, Vestibular training, Cryotherapy, and Moist heat  PLAN FOR NEXT SESSION: Berg and create goal, review HEP, progress static and dynamic balance, gait training without AD vs. Trial cane; consider and create goal        "

## 2024-11-01 ENCOUNTER — Ambulatory Visit: Admitting: Physical Therapy

## 2024-11-04 ENCOUNTER — Ambulatory Visit: Admitting: Physical Therapy

## 2024-11-08 ENCOUNTER — Ambulatory Visit

## 2024-11-11 ENCOUNTER — Ambulatory Visit: Admitting: Physical Therapy

## 2024-11-15 ENCOUNTER — Ambulatory Visit: Admitting: Physical Therapy

## 2024-11-16 ENCOUNTER — Encounter: Admitting: Physical Medicine & Rehabilitation

## 2024-11-17 ENCOUNTER — Other Ambulatory Visit

## 2024-11-18 ENCOUNTER — Ambulatory Visit

## 2024-11-22 ENCOUNTER — Ambulatory Visit: Admitting: Physical Therapy

## 2024-11-23 ENCOUNTER — Inpatient Hospital Stay: Admitting: Internal Medicine

## 2024-11-25 ENCOUNTER — Ambulatory Visit: Admitting: Physical Therapy

## 2024-11-29 ENCOUNTER — Ambulatory Visit

## 2024-12-02 ENCOUNTER — Ambulatory Visit

## 2024-12-06 ENCOUNTER — Ambulatory Visit

## 2024-12-09 ENCOUNTER — Ambulatory Visit
